# Patient Record
Sex: Female | Born: 1955 | Race: White | Hispanic: No | Smoking: Current every day smoker
Health system: Southern US, Community
[De-identification: ages and names within clinical notes are randomized; demographics above are authoritative.]

## PROBLEM LIST (undated history)

## (undated) DIAGNOSIS — G629 Polyneuropathy, unspecified: Secondary | ICD-10-CM

## (undated) DIAGNOSIS — J302 Other seasonal allergic rhinitis: Secondary | ICD-10-CM

## (undated) DIAGNOSIS — I10 Essential (primary) hypertension: Secondary | ICD-10-CM

## (undated) DIAGNOSIS — C801 Malignant (primary) neoplasm, unspecified: Secondary | ICD-10-CM

## (undated) DIAGNOSIS — E7800 Pure hypercholesterolemia, unspecified: Secondary | ICD-10-CM

## (undated) DIAGNOSIS — E119 Type 2 diabetes mellitus without complications: Secondary | ICD-10-CM

## (undated) HISTORY — PX: TUBAL LIGATION: SHX77

## (undated) HISTORY — PX: LAPAROSCOPY: SHX197

## (undated) HISTORY — PX: FOOT SURGERY: SHX648

## (undated) HISTORY — PX: TUNNELED VENOUS CATHETER PLACEMENT: SHX818

---

## 2001-11-11 ENCOUNTER — Encounter: Admission: RE | Admit: 2001-11-11 | Discharge: 2001-11-11 | Payer: Self-pay | Admitting: Pulmonary Disease

## 2003-06-25 ENCOUNTER — Ambulatory Visit (HOSPITAL_COMMUNITY): Admission: RE | Admit: 2003-06-25 | Discharge: 2003-06-25 | Payer: Self-pay | Admitting: Pulmonary Disease

## 2003-07-05 ENCOUNTER — Ambulatory Visit (HOSPITAL_COMMUNITY): Admission: RE | Admit: 2003-07-05 | Discharge: 2003-07-05 | Payer: Self-pay | Admitting: Pulmonary Disease

## 2006-03-15 ENCOUNTER — Ambulatory Visit (HOSPITAL_COMMUNITY): Admission: RE | Admit: 2006-03-15 | Discharge: 2006-03-15 | Payer: Self-pay | Admitting: Pulmonary Disease

## 2011-02-26 DIAGNOSIS — F329 Major depressive disorder, single episode, unspecified: Secondary | ICD-10-CM | POA: Diagnosis not present

## 2011-02-26 DIAGNOSIS — E785 Hyperlipidemia, unspecified: Secondary | ICD-10-CM | POA: Diagnosis not present

## 2011-02-26 DIAGNOSIS — I10 Essential (primary) hypertension: Secondary | ICD-10-CM | POA: Diagnosis not present

## 2011-03-13 DIAGNOSIS — E785 Hyperlipidemia, unspecified: Secondary | ICD-10-CM | POA: Diagnosis not present

## 2011-03-13 DIAGNOSIS — I10 Essential (primary) hypertension: Secondary | ICD-10-CM | POA: Diagnosis not present

## 2011-10-16 DIAGNOSIS — E109 Type 1 diabetes mellitus without complications: Secondary | ICD-10-CM | POA: Diagnosis not present

## 2011-10-16 DIAGNOSIS — I10 Essential (primary) hypertension: Secondary | ICD-10-CM | POA: Diagnosis not present

## 2011-10-16 DIAGNOSIS — F329 Major depressive disorder, single episode, unspecified: Secondary | ICD-10-CM | POA: Diagnosis not present

## 2011-10-16 DIAGNOSIS — E785 Hyperlipidemia, unspecified: Secondary | ICD-10-CM | POA: Diagnosis not present

## 2011-10-18 ENCOUNTER — Other Ambulatory Visit (HOSPITAL_COMMUNITY): Payer: Self-pay | Admitting: Pulmonary Disease

## 2011-10-18 DIAGNOSIS — Z139 Encounter for screening, unspecified: Secondary | ICD-10-CM

## 2011-10-25 ENCOUNTER — Ambulatory Visit (HOSPITAL_COMMUNITY)
Admission: RE | Admit: 2011-10-25 | Discharge: 2011-10-25 | Disposition: A | Discharge status: Home / Self Care | Payer: Medicare Other | Source: Ambulatory Visit | Attending: Pulmonary Disease | Admitting: Pulmonary Disease

## 2011-10-25 DIAGNOSIS — Z1231 Encounter for screening mammogram for malignant neoplasm of breast: Secondary | ICD-10-CM | POA: Insufficient documentation

## 2011-10-25 DIAGNOSIS — Z139 Encounter for screening, unspecified: Secondary | ICD-10-CM

## 2011-11-19 ENCOUNTER — Telehealth: Payer: Self-pay

## 2011-11-19 NOTE — Telephone Encounter (Signed)
LMOM for pt to call. 

## 2011-11-28 NOTE — Telephone Encounter (Signed)
Pt is to call back when she can find someone that she can work out transportation with.

## 2011-12-03 ENCOUNTER — Other Ambulatory Visit: Payer: Self-pay

## 2011-12-03 ENCOUNTER — Telehealth: Payer: Self-pay | Admitting: *Deleted

## 2011-12-03 DIAGNOSIS — Z139 Encounter for screening, unspecified: Secondary | ICD-10-CM

## 2011-12-03 NOTE — Telephone Encounter (Signed)
Kim [name] called over the weekend. Please call her back. Thanks.

## 2011-12-03 NOTE — Telephone Encounter (Signed)
Returned pt's call. LMOM to call.  

## 2011-12-03 NOTE — Telephone Encounter (Signed)
Returned call and LMOM to call. ( Please see triage phone note).

## 2011-12-12 ENCOUNTER — Encounter (HOSPITAL_COMMUNITY): Payer: Self-pay | Admitting: Pharmacy Technician

## 2011-12-12 NOTE — Telephone Encounter (Signed)
CONTINUE GLUCOPHAGE.  PREPOPIK-DRINK WATER TO KEEP URINE LIGHT YELLOW.  PT SHOULD DROP OFF RX 3 DAYS PRIOR TO PROCEDURE.  

## 2011-12-12 NOTE — Telephone Encounter (Signed)
Gastroenterology Pre-Procedure Form    Request Date: 12/12/2011      Requesting Physician: Dr. Juanetta Gosling     PATIENT INFORMATION:  Kim [name] is a 56 y.o., female (DOB=16-Mar-1955).  PROCEDURE: Procedure(s) requested: colonoscopy Procedure Reason: screening for colon cancer  PATIENT REVIEW QUESTIONS: The patient reports the following:   1. Diabetes Melitis: yes  2. Joint replacements in the past 12 months: no 3. Major health problems in the past 3 months: no 4. Has an artificial valve or MVP:no 5. Has been advised in past to take antibiotics in advance of a procedure like teeth cleaning: no}    MEDICATIONS & ALLERGIES:    Patient reports the following regarding taking any blood thinners:   Plavix? no Aspirin?no Coumadin?  no  Patient confirms/reports the following medications:  Current Outpatient Prescriptions  Medication Sig Dispense Refill  . losartan-hydrochlorothiazide (HYZAAR) 100-25 MG per tablet Take 1 tablet by mouth daily.      . metFORMIN (GLUCOPHAGE) 1000 MG tablet Take 1,000 mg by mouth 2 (two) times daily with a meal.      . omeprazole (PRILOSEC) 20 MG capsule Take 20 mg by mouth daily. Takes only as needed      . pravastatin (PRAVACHOL) 40 MG tablet Take 40 mg by mouth daily.        Patient confirms/reports the following allergies:  No Known Allergies  Patient is appropriate to schedule for requested procedure(s): yes  AUTHORIZATION INFORMATION Primary Insurance:   ID #:   Group #:  Pre-Cert / Auth required:  Pre-Cert / Auth #:   Secondary Insurance:   ID #:   Group #:  Pre-Cert / Auth required:  Pre-Cert / Auth #:   No orders of the defined types were placed in this encounter.    SCHEDULE INFORMATION: Procedure has been scheduled as follows:  Date: 12/28/2011                    Time: 8:30 AM  Location: [facility] Short Stay  This Gastroenterology Pre-Precedure Form is being routed to the following provider(s) for review: Jonette Eva,  MD

## 2011-12-13 MED ORDER — SOD PICOSULFATE-MAG OX-CIT ACD 10-3.5-12 MG-GM-GM PO PACK: 1.0000 | PACK | Freq: Once | ORAL | Status: DC

## 2011-12-13 NOTE — Telephone Encounter (Signed)
Rx sent to [facility] and Instructions mailed to pt.

## 2011-12-24 ENCOUNTER — Telehealth: Payer: Self-pay | Admitting: Gastroenterology

## 2011-12-24 NOTE — Telephone Encounter (Signed)
Explained to pt that Dr. Darrick Penna did not make adjustments on her Glucophage.

## 2011-12-24 NOTE — Telephone Encounter (Signed)
Pt called to speak with nurse about her diabetic medication prior to her colonoscopy. Please call her back at [phone]

## 2011-12-28 ENCOUNTER — Ambulatory Visit (HOSPITAL_COMMUNITY)
Admission: RE | Admit: 2011-12-28 | Discharge: 2011-12-28 | Disposition: A | Discharge status: Home / Self Care | Payer: Medicare Other | Source: Ambulatory Visit | Attending: Gastroenterology | Admitting: Gastroenterology

## 2011-12-28 ENCOUNTER — Encounter (HOSPITAL_COMMUNITY): Payer: Self-pay | Admitting: *Deleted

## 2011-12-28 ENCOUNTER — Encounter (HOSPITAL_COMMUNITY)
Admission: RE | Disposition: A | Discharge status: Home / Self Care | Payer: Self-pay | Source: Ambulatory Visit | Attending: Gastroenterology

## 2011-12-28 DIAGNOSIS — Z1211 Encounter for screening for malignant neoplasm of colon: Secondary | ICD-10-CM | POA: Diagnosis not present

## 2011-12-28 DIAGNOSIS — I10 Essential (primary) hypertension: Secondary | ICD-10-CM | POA: Insufficient documentation

## 2011-12-28 DIAGNOSIS — K648 Other hemorrhoids: Secondary | ICD-10-CM | POA: Insufficient documentation

## 2011-12-28 DIAGNOSIS — E119 Type 2 diabetes mellitus without complications: Secondary | ICD-10-CM | POA: Diagnosis not present

## 2011-12-28 DIAGNOSIS — K5730 Diverticulosis of large intestine without perforation or abscess without bleeding: Secondary | ICD-10-CM | POA: Insufficient documentation

## 2011-12-28 DIAGNOSIS — Z139 Encounter for screening, unspecified: Secondary | ICD-10-CM

## 2011-12-28 DIAGNOSIS — D126 Benign neoplasm of colon, unspecified: Secondary | ICD-10-CM | POA: Insufficient documentation

## 2011-12-28 HISTORY — DX: Other seasonal allergic rhinitis: J30.2

## 2011-12-28 HISTORY — PX: COLONOSCOPY: SHX5424

## 2011-12-28 HISTORY — DX: Pure hypercholesterolemia, unspecified: E78.00

## 2011-12-28 HISTORY — DX: Essential (primary) hypertension: I10

## 2011-12-28 HISTORY — DX: Type 2 diabetes mellitus without complications: E11.9

## 2011-12-28 HISTORY — DX: Polyneuropathy, unspecified: G62.9

## 2011-12-28 HISTORY — DX: Malignant (primary) neoplasm, unspecified: C80.1

## 2011-12-28 LAB — GLUCOSE, CAPILLARY: Glucose-Capillary: 179 mg/dL — ABNORMAL HIGH (ref 70–99)

## 2011-12-28 SURGERY — COLONOSCOPY
Anesthesia: Moderate Sedation

## 2011-12-28 MED ORDER — SODIUM CHLORIDE 0.45 % IV SOLN
INTRAVENOUS | Status: DC
  Administered 2011-12-28: 09:00:00 via INTRAVENOUS

## 2011-12-28 MED ORDER — MEPERIDINE HCL 100 MG/ML IJ SOLN
INTRAMUSCULAR | Status: AC
  Filled 2011-12-28: qty 1

## 2011-12-28 MED ORDER — STERILE WATER FOR IRRIGATION IR SOLN
Status: DC | PRN
  Administered 2011-12-28: 09:00:00

## 2011-12-28 MED ORDER — MIDAZOLAM HCL 5 MG/5ML IJ SOLN
INTRAMUSCULAR | Status: AC
  Filled 2011-12-28: qty 10

## 2011-12-28 MED ORDER — MEPERIDINE HCL 100 MG/ML IJ SOLN
INTRAMUSCULAR | Status: DC | PRN
  Administered 2011-12-28: 50 mg via INTRAVENOUS
  Administered 2011-12-28: 25 mg via INTRAVENOUS

## 2011-12-28 MED ORDER — MIDAZOLAM HCL 5 MG/5ML IJ SOLN
INTRAMUSCULAR | Status: DC | PRN
  Administered 2011-12-28 (×2): 2 mg via INTRAVENOUS

## 2011-12-28 NOTE — H&P (Signed)
  Primary Care Physician:  Fredirick Maudlin, MD Primary Gastroenterologist:  Dr. Darrick Penna  Pre-Procedure History & Physical: HPI:  Kim [name] is a 56 y.o. female here for COLON CANCER SCREENING.   Past Medical History  Diagnosis Date  . Hypertension   . Diabetes mellitus without complication   . Hypercholesteremia   . Seasonal allergies   . Neuropathy   . Cancer     history of leukemia    Past Surgical History  Procedure Date  . Tubal ligation   . Laparoscopy   . Tunneled venous catheter placement     Prior to Admission medications   Medication Sig Start Date End Date Taking? Authorizing Provider  acetaminophen (TYLENOL) 500 MG tablet Take 500 mg by mouth every 6 (six) hours as needed. Pain.   Yes Historical Provider, MD  losartan-hydrochlorothiazide (HYZAAR) 100-25 MG per tablet Take 1 tablet by mouth daily.   Yes Historical Provider, MD  metFORMIN (GLUCOPHAGE) 1000 MG tablet Take 1,000 mg by mouth 2 (two) times daily with a meal.   Yes Historical Provider, MD  omeprazole (PRILOSEC) 20 MG capsule Take 20 mg by mouth daily as needed. Heartburn   Yes Historical Provider, MD  pravastatin (PRAVACHOL) 40 MG tablet Take 40 mg by mouth daily.   Yes Historical Provider, MD    Allergies as of 12/03/2011  . (No Known Allergies)    Family History  Problem Relation Age of Onset  . Colon cancer Neg Hx     History   Social History  . Marital Status: Divorced    Spouse Name: N/A    Number of Children: N/A  . Years of Education: N/A   Occupational History  . Not on file.   Social History Main Topics  . Smoking status: Current Every Day Smoker -- 1.0 packs/day for 40 years  . Smokeless tobacco: Not on file  . Alcohol Use: No  . Drug Use: No  . Sexually Active:    Other Topics Concern  . Not on file   Social History Narrative  . No narrative on file    Review of Systems: See HPI, otherwise negative ROS   Physical Exam: BP 124/84  Pulse 99  Temp 97.9 F  (36.6 C) (Oral)  Resp 20  Ht 5\' 8"  (1.727 m)  Wt 145 lb (65.772 kg)  BMI 22.05 kg/m2  SpO2 96% General:   Alert,  pleasant and cooperative in NAD Head:  Normocephalic and atraumatic. Neck:  Supple; Lungs:  Clear throughout to auscultation.    Heart:  Regular rate and rhythm. Abdomen:  Soft, nontender and nondistended. Normal bowel sounds, without guarding, and without rebound.   Neurologic:  Alert and  oriented x4;  grossly normal neurologically.  Impression/Plan:     SCREENING  Plan:  1. TCS TODAY

## 2011-12-28 NOTE — Op Note (Signed)
[facility] [address] [place], [address]   COLONOSCOPY PROCEDURE REPORT  PATIENT: Kim [name], Kim [name]  MR#: [id number] BIRTHDATE: January 30, 1956 , 56  yrs. old GENDER: Female ENDOSCOPIST: Jonette Eva, MD REFERRED [id number] Juanetta Gosling, M.D. PROCEDURE DATE:  12/28/2011 PROCEDURE:   Colonoscopy with snare polypectomy INDICATIONS:average risk patient for colon cancer. MEDICATIONS: Demerol 75 mg IV and Versed 4 mg IV  DESCRIPTION OF PROCEDURE:    Physical exam was performed.  Informed consent was obtained from the patient after explaining the benefits, risks, and alternatives to procedure.  The patient was connected to monitor and placed in left lateral position. Continuous oxygen was provided by nasal cannula and IV medicine administered through an indwelling cannula.  After administration of sedation and rectal exam, the patients rectum was intubated and the Pentax Colonoscope [id number]  colonoscope was advanced under direct visualization to the cecum.  The scope was removed slowly by carefully examining the color, texture, anatomy, and integrity mucosa on the way out.  The patient was recovered in endoscopy and discharged home in satisfactory condition.       COLON FINDINGS: Two sessile polyps measuring 6-8 mm in size were found in the ascending colon.  A polypectomy was performed using snare cautery.  , Moderate diverticulosis was noted in the ascending colon.  , The colon mucosa was otherwise normal.  , and Small internal hemorrhoids were found.  PREP QUALITY: good. CECAL W/D TIME: 19 minutes  COMPLICATIONS: None  ENDOSCOPIC IMPRESSION: 1.   Two sessile polyps measuring 6-8 mm in size were found in the ascending colon; polypectomy was performed using snare cautery 2.   Moderate diverticulosis was noted in the ascending colon 3.   The colon mucosa was otherwise normal 4.   Small internal hemorrhoids   RECOMMENDATIONS: AWAIT BIOPSY HIGH FIBER DIET TCS  IN 10 YEARS       _______________________________ Rosalie DoctorJonette Eva, MD 12/28/2011 10:32 AM     PATIENT NAME:  Kim, [name] MR#: [id number]

## 2012-01-01 ENCOUNTER — Encounter (HOSPITAL_COMMUNITY): Payer: Self-pay | Admitting: Gastroenterology

## 2012-01-07 ENCOUNTER — Telehealth: Payer: Self-pay

## 2012-01-07 NOTE — Telephone Encounter (Signed)
LMOM to call.

## 2012-01-07 NOTE — Telephone Encounter (Signed)
Pt left VM requesting results of her colonoscopy/biopsy. Dr. Darrick Penna out this week. Note to Tana Coast, PA.

## 2012-01-07 NOTE — Telephone Encounter (Signed)
Pt returned call and was informed.  

## 2012-01-07 NOTE — Telephone Encounter (Signed)
You can let patient know the polyps showed no cancer. Further recommendations to follow from Dr. Darrick Penna.

## 2012-01-08 ENCOUNTER — Telehealth: Payer: Self-pay | Admitting: Gastroenterology

## 2012-01-08 NOTE — Telephone Encounter (Signed)
Path faxed to PCP, recall made 

## 2012-01-08 NOTE — Telephone Encounter (Signed)
Pt aware of results 

## 2012-01-08 NOTE — Telephone Encounter (Signed)
Please call pt. She had adenomas removed from her colon.  High fiber diet. TCS in 10 years.

## 2012-07-10 DIAGNOSIS — I10 Essential (primary) hypertension: Secondary | ICD-10-CM | POA: Diagnosis not present

## 2012-07-10 DIAGNOSIS — E785 Hyperlipidemia, unspecified: Secondary | ICD-10-CM | POA: Diagnosis not present

## 2012-07-10 DIAGNOSIS — E1169 Type 2 diabetes mellitus with other specified complication: Secondary | ICD-10-CM | POA: Diagnosis not present

## 2012-07-10 DIAGNOSIS — J301 Allergic rhinitis due to pollen: Secondary | ICD-10-CM | POA: Diagnosis not present

## 2012-07-10 DIAGNOSIS — F329 Major depressive disorder, single episode, unspecified: Secondary | ICD-10-CM | POA: Diagnosis not present

## 2012-09-15 ENCOUNTER — Ambulatory Visit (HOSPITAL_COMMUNITY)
Admission: RE | Admit: 2012-09-15 | Discharge: 2012-09-15 | Disposition: A | Discharge status: Home / Self Care | Payer: Medicare Other | Source: Ambulatory Visit | Attending: Pulmonary Disease | Admitting: Pulmonary Disease

## 2012-09-15 DIAGNOSIS — IMO0001 Reserved for inherently not codable concepts without codable children: Secondary | ICD-10-CM | POA: Insufficient documentation

## 2012-09-15 DIAGNOSIS — M6281 Muscle weakness (generalized): Secondary | ICD-10-CM | POA: Insufficient documentation

## 2012-09-15 DIAGNOSIS — I10 Essential (primary) hypertension: Secondary | ICD-10-CM | POA: Diagnosis not present

## 2012-09-15 DIAGNOSIS — E119 Type 2 diabetes mellitus without complications: Secondary | ICD-10-CM | POA: Insufficient documentation

## 2012-09-15 DIAGNOSIS — Z856 Personal history of leukemia: Secondary | ICD-10-CM | POA: Insufficient documentation

## 2012-09-15 DIAGNOSIS — G5790 Unspecified mononeuropathy of unspecified lower limb: Secondary | ICD-10-CM | POA: Diagnosis not present

## 2012-09-15 NOTE — Evaluation (Signed)
Occupational Therapy Evaluation  Patient Details  Name: Kim [name] MRN: [id number] Date of Birth: 1956/01/24  Today's Date: 09/15/2012 Time: 1050-1130 OT Time Calculation (min): 40 min       [name] is a 57 year old female who has been referred to occupational therapy for assessment for need of a power wheelchair.  [name] has a past medical history that includes leukemia, lumbar puncture, diabetes, hypertension, and neuropathy.        [name] lives in a one story home with a ramped entrance.  She lives with her daughter and son in law, and their children.   [name] has a power chair that is more than 58 years old and is in irreparable condition, She would like a replacement power wheelchair to improve her safety and independence in her home.  She is not able to ambulate at all in her home.  She has bladder urgency and must be able to get to the bathroom quickly.  She is able to dress and bathe herself.   A FULL PHYSICAL ASSESSMENT REVEALS THE FOLLOWING  Existing Equipment: [name] has a power and standard wheelchair and a tub chair. Transfers: [name] declined transferring this date.   Ambulation:  Per patient, she is non-ambulatory. Balance:  WFL static.   Head and Neck: WNL  Trunk: WFL Pelvis: WNL  Hip: Bilateral AROM is WFL. Knees: Bilateral AROM is WFL. Feet and Ankles: Bilateral AROM is WFL. Upper Extremities: Bilateral upper extremity AROM and strength is WNL. Lower Extremities: [name] has 4/5 strength in lower extremities. Weight Shifting Ability:   independent with weight shifting. Skin Integrity: WFL. Activity Tolerance:  Fair.  GOALS/OBJECTIVE OF SEATING INTERVENTION  Recommendations:  [name] has functional deficits in the areas of mobility and self care, which are a result of the above listed diagnosis.  Specific functional limitations include: The patient is unable to walk.  She is unable to functionally propel a lightweight or standard  manual wheelchair for independent mobility quickly and safely enough in her home. [name] will use the power wheelchair on a daily basis as her primary means of mobility.   I recommend the Pride Q6 Edge Grp3 w/Rehab Seat with height adjustable armrests, joystick for controlling the chair, and a seat cushion.  The recommended wheelchair meets current and future positioning needs, accessibility, durability, and safety requirements for functional use within patient's home environment. It is the least expensive power wheelchair that meets the patients current and future needs.   If you require any further information concerning [name]'s positioning, independence or mobility needs; or any further information why a lesser device will not work, please do not hesitate to contact me at [facility], [address], [place] [address] [phone].   ____________________ __________  Sondra Barges, OTR/L     Date    GO Functional Assessment Tool Used: clinical observation Functional Limitation: Mobility: Walking and moving around Mobility: Walking and Moving Around Current Status [phone]): At least 80 percent but less than 100 percent impaired, limited or restricted Mobility: Walking and Moving Around Goal Status [phone]): At least 80 percent but less than 100 percent impaired, limited or restricted Mobility: Walking and Moving Around Discharge Status [phone]): At least 80 percent but less than 100 percent impaired, limited or restricted

## 2012-10-09 DIAGNOSIS — E109 Type 1 diabetes mellitus without complications: Secondary | ICD-10-CM | POA: Diagnosis not present

## 2012-10-09 DIAGNOSIS — G589 Mononeuropathy, unspecified: Secondary | ICD-10-CM | POA: Diagnosis not present

## 2012-10-09 DIAGNOSIS — J449 Chronic obstructive pulmonary disease, unspecified: Secondary | ICD-10-CM | POA: Diagnosis not present

## 2012-10-09 DIAGNOSIS — I10 Essential (primary) hypertension: Secondary | ICD-10-CM | POA: Diagnosis not present

## 2013-01-06 DIAGNOSIS — Z1212 Encounter for screening for malignant neoplasm of rectum: Secondary | ICD-10-CM | POA: Diagnosis not present

## 2013-01-06 DIAGNOSIS — I10 Essential (primary) hypertension: Secondary | ICD-10-CM | POA: Diagnosis not present

## 2013-01-06 DIAGNOSIS — E785 Hyperlipidemia, unspecified: Secondary | ICD-10-CM | POA: Diagnosis not present

## 2013-01-06 DIAGNOSIS — G589 Mononeuropathy, unspecified: Secondary | ICD-10-CM | POA: Diagnosis not present

## 2013-01-06 DIAGNOSIS — Z23 Encounter for immunization: Secondary | ICD-10-CM | POA: Diagnosis not present

## 2013-01-06 DIAGNOSIS — E109 Type 1 diabetes mellitus without complications: Secondary | ICD-10-CM | POA: Diagnosis not present

## 2013-04-08 DIAGNOSIS — E109 Type 1 diabetes mellitus without complications: Secondary | ICD-10-CM | POA: Diagnosis not present

## 2013-04-08 DIAGNOSIS — G589 Mononeuropathy, unspecified: Secondary | ICD-10-CM | POA: Diagnosis not present

## 2013-04-08 DIAGNOSIS — I10 Essential (primary) hypertension: Secondary | ICD-10-CM | POA: Diagnosis not present

## 2013-04-08 DIAGNOSIS — F3289 Other specified depressive episodes: Secondary | ICD-10-CM | POA: Diagnosis not present

## 2013-04-08 DIAGNOSIS — F329 Major depressive disorder, single episode, unspecified: Secondary | ICD-10-CM | POA: Diagnosis not present

## 2013-04-08 DIAGNOSIS — E785 Hyperlipidemia, unspecified: Secondary | ICD-10-CM | POA: Diagnosis not present

## 2013-05-26 ENCOUNTER — Inpatient Hospital Stay (HOSPITAL_COMMUNITY)
Admission: EM | Admit: 2013-05-26 | Discharge: 2013-05-28 | DRG: 190 | Disposition: A | Discharge status: Home / Self Care | Payer: Medicare Other | Attending: Pulmonary Disease | Admitting: Pulmonary Disease

## 2013-05-26 ENCOUNTER — Emergency Department (HOSPITAL_COMMUNITY): Payer: Medicare Other

## 2013-05-26 ENCOUNTER — Encounter (HOSPITAL_COMMUNITY): Payer: Self-pay | Admitting: Emergency Medicine

## 2013-05-26 DIAGNOSIS — J189 Pneumonia, unspecified organism: Secondary | ICD-10-CM | POA: Diagnosis present

## 2013-05-26 DIAGNOSIS — Z853 Personal history of malignant neoplasm of breast: Secondary | ICD-10-CM

## 2013-05-26 DIAGNOSIS — Z993 Dependence on wheelchair: Secondary | ICD-10-CM | POA: Diagnosis not present

## 2013-05-26 DIAGNOSIS — Z856 Personal history of leukemia: Secondary | ICD-10-CM

## 2013-05-26 DIAGNOSIS — R0602 Shortness of breath: Secondary | ICD-10-CM | POA: Diagnosis not present

## 2013-05-26 DIAGNOSIS — J441 Chronic obstructive pulmonary disease with (acute) exacerbation: Secondary | ICD-10-CM | POA: Diagnosis not present

## 2013-05-26 DIAGNOSIS — E669 Obesity, unspecified: Secondary | ICD-10-CM | POA: Diagnosis present

## 2013-05-26 DIAGNOSIS — E119 Type 2 diabetes mellitus without complications: Secondary | ICD-10-CM | POA: Diagnosis not present

## 2013-05-26 DIAGNOSIS — F17200 Nicotine dependence, unspecified, uncomplicated: Secondary | ICD-10-CM | POA: Diagnosis present

## 2013-05-26 DIAGNOSIS — E7800 Pure hypercholesterolemia, unspecified: Secondary | ICD-10-CM | POA: Diagnosis present

## 2013-05-26 DIAGNOSIS — Z23 Encounter for immunization: Secondary | ICD-10-CM

## 2013-05-26 DIAGNOSIS — G620 Drug-induced polyneuropathy: Secondary | ICD-10-CM | POA: Diagnosis present

## 2013-05-26 DIAGNOSIS — I10 Essential (primary) hypertension: Secondary | ICD-10-CM | POA: Diagnosis present

## 2013-05-26 DIAGNOSIS — T451X5A Adverse effect of antineoplastic and immunosuppressive drugs, initial encounter: Secondary | ICD-10-CM | POA: Diagnosis present

## 2013-05-26 DIAGNOSIS — J449 Chronic obstructive pulmonary disease, unspecified: Secondary | ICD-10-CM | POA: Diagnosis present

## 2013-05-26 HISTORY — DX: Pneumonia, unspecified organism: J18.9

## 2013-05-26 LAB — CBC WITH DIFFERENTIAL/PLATELET
BASOS ABS: 0 10*3/uL (ref 0.0–0.1)
Basophils Relative: 0 % (ref 0–1)
EOS ABS: 0 10*3/uL (ref 0.0–0.7)
Eosinophils Relative: 0 % (ref 0–5)
HCT: 37.6 % (ref 36.0–46.0)
Hemoglobin: 13.1 g/dL (ref 12.0–15.0)
LYMPHS ABS: 1.5 10*3/uL (ref 0.7–4.0)
Lymphocytes Relative: 13 % (ref 12–46)
MCH: 30.1 pg (ref 26.0–34.0)
MCHC: 34.8 g/dL (ref 30.0–36.0)
MCV: 86.4 fL (ref 78.0–100.0)
MONO ABS: 0.9 10*3/uL (ref 0.1–1.0)
Monocytes Relative: 8 % (ref 3–12)
NEUTROS PCT: 79 % — AB (ref 43–77)
Neutro Abs: 9 10*3/uL — ABNORMAL HIGH (ref 1.7–7.7)
PLATELETS: 444 10*3/uL — AB (ref 150–400)
RBC: 4.35 MIL/uL (ref 3.87–5.11)
RDW: 12.6 % (ref 11.5–15.5)
WBC: 11.4 10*3/uL — ABNORMAL HIGH (ref 4.0–10.5)

## 2013-05-26 LAB — BASIC METABOLIC PANEL
BUN: 22 mg/dL (ref 6–23)
CALCIUM: 9.1 mg/dL (ref 8.4–10.5)
CO2: 22 mEq/L (ref 19–32)
CREATININE: 1.16 mg/dL — AB (ref 0.50–1.10)
Chloride: 97 mEq/L (ref 96–112)
GFR, EST AFRICAN AMERICAN: 59 mL/min — AB (ref >90)
GFR, EST NON AFRICAN AMERICAN: 50 mL/min — AB (ref >90)
Glucose, Bld: 277 mg/dL — ABNORMAL HIGH (ref 70–99)
Potassium: 3.3 mEq/L — ABNORMAL LOW (ref 3.7–5.3)
Sodium: 140 mEq/L (ref 137–147)

## 2013-05-26 LAB — GLUCOSE, CAPILLARY: Glucose-Capillary: 291 mg/dL — ABNORMAL HIGH (ref 70–99)

## 2013-05-26 LAB — I-STAT CG4 LACTIC ACID, ED: Lactic Acid, Venous: 2.95 mmol/L — ABNORMAL HIGH (ref 0.5–2.2)

## 2013-05-26 LAB — CBG MONITORING, ED: Glucose-Capillary: 229 mg/dL — ABNORMAL HIGH (ref 70–99)

## 2013-05-26 MED ORDER — SODIUM CHLORIDE 0.9 % IV SOLN
1000.0000 mL | INTRAVENOUS | Status: DC
  Administered 2013-05-26 – 2013-05-28 (×4): 1000 mL via INTRAVENOUS

## 2013-05-26 MED ORDER — IPRATROPIUM BROMIDE 0.02 % IN SOLN
1.0000 mg | Freq: Once | RESPIRATORY_TRACT | Status: AC
  Administered 2013-05-26: 1 mg via RESPIRATORY_TRACT
  Filled 2013-05-26: qty 5

## 2013-05-26 MED ORDER — LOSARTAN POTASSIUM-HCTZ 100-25 MG PO TABS: 1.0000 | ORAL_TABLET | Freq: Every day | ORAL | Status: DC

## 2013-05-26 MED ORDER — ALBUTEROL (5 MG/ML) CONTINUOUS INHALATION SOLN
10.0000 mg/h | INHALATION_SOLUTION | RESPIRATORY_TRACT | Status: DC
  Administered 2013-05-26: 10 mg/h via RESPIRATORY_TRACT
  Filled 2013-05-26: qty 20

## 2013-05-26 MED ORDER — SODIUM CHLORIDE 0.9 % IV SOLN
1000.0000 mL | Freq: Once | INTRAVENOUS | Status: AC
  Administered 2013-05-26: 1000 mL via INTRAVENOUS

## 2013-05-26 MED ORDER — SODIUM CHLORIDE 0.9 % IV SOLN: 1000.0000 mL | INTRAVENOUS | Status: DC

## 2013-05-26 MED ORDER — PANTOPRAZOLE SODIUM 40 MG PO TBEC
40.0000 mg | DELAYED_RELEASE_TABLET | Freq: Every day | ORAL | Status: DC
  Administered 2013-05-26 – 2013-05-28 (×3): 40 mg via ORAL
  Filled 2013-05-26 (×3): qty 1

## 2013-05-26 MED ORDER — METFORMIN HCL 500 MG PO TABS
1000.0000 mg | ORAL_TABLET | Freq: Two times a day (BID) | ORAL | Status: DC
  Administered 2013-05-26: 1000 mg via ORAL
  Filled 2013-05-26 (×2): qty 2

## 2013-05-26 MED ORDER — SODIUM CHLORIDE 0.9 % IJ SOLN: 3.0000 mL | Freq: Two times a day (BID) | INTRAMUSCULAR | Status: DC

## 2013-05-26 MED ORDER — INSULIN ASPART 100 UNIT/ML ~~LOC~~ SOLN
0.0000 [IU] | Freq: Three times a day (TID) | SUBCUTANEOUS | Status: DC
  Administered 2013-05-27: 7 [IU] via SUBCUTANEOUS
  Administered 2013-05-27 – 2013-05-28 (×3): 11 [IU] via SUBCUTANEOUS

## 2013-05-26 MED ORDER — ALBUTEROL SULFATE (2.5 MG/3ML) 0.083% IN NEBU
5.0000 mg | INHALATION_SOLUTION | Freq: Once | RESPIRATORY_TRACT | Status: AC
  Administered 2013-05-26: 5 mg via RESPIRATORY_TRACT
  Filled 2013-05-26: qty 6

## 2013-05-26 MED ORDER — ACETAMINOPHEN 500 MG PO TABS: 500.0000 mg | ORAL_TABLET | Freq: Four times a day (QID) | ORAL | Status: DC | PRN

## 2013-05-26 MED ORDER — HYDROCHLOROTHIAZIDE 25 MG PO TABS
25.0000 mg | ORAL_TABLET | Freq: Every day | ORAL | Status: DC
  Administered 2013-05-27 – 2013-05-28 (×2): 25 mg via ORAL
  Filled 2013-05-26 (×2): qty 1

## 2013-05-26 MED ORDER — LOSARTAN POTASSIUM 50 MG PO TABS
100.0000 mg | ORAL_TABLET | Freq: Every day | ORAL | Status: DC
  Administered 2013-05-27 – 2013-05-28 (×2): 100 mg via ORAL
  Filled 2013-05-26 (×2): qty 2

## 2013-05-26 MED ORDER — ONDANSETRON HCL 4 MG PO TABS: 4.0000 mg | ORAL_TABLET | Freq: Four times a day (QID) | ORAL | Status: DC | PRN

## 2013-05-26 MED ORDER — ONDANSETRON HCL 4 MG/2ML IJ SOLN: 4.0000 mg | Freq: Four times a day (QID) | INTRAMUSCULAR | Status: DC | PRN

## 2013-05-26 MED ORDER — LEVOFLOXACIN IN D5W 750 MG/150ML IV SOLN
750.0000 mg | Freq: Once | INTRAVENOUS | Status: AC
  Administered 2013-05-26: 750 mg via INTRAVENOUS
  Filled 2013-05-26: qty 150

## 2013-05-26 MED ORDER — LEVOFLOXACIN IN D5W 500 MG/100ML IV SOLN
500.0000 mg | INTRAVENOUS | Status: DC
  Administered 2013-05-27: 500 mg via INTRAVENOUS
  Filled 2013-05-26: qty 100

## 2013-05-26 MED ORDER — INSULIN ASPART 100 UNIT/ML ~~LOC~~ SOLN
0.0000 [IU] | Freq: Every day | SUBCUTANEOUS | Status: DC
  Administered 2013-05-26 – 2013-05-27 (×2): 3 [IU] via SUBCUTANEOUS

## 2013-05-26 MED ORDER — METHYLPREDNISOLONE SODIUM SUCC 125 MG IJ SOLR
125.0000 mg | Freq: Four times a day (QID) | INTRAMUSCULAR | Status: DC
  Administered 2013-05-26 – 2013-05-27 (×3): 125 mg via INTRAVENOUS
  Filled 2013-05-26 (×3): qty 2

## 2013-05-26 MED ORDER — PNEUMOCOCCAL VAC POLYVALENT 25 MCG/0.5ML IJ INJ
0.5000 mL | INJECTION | INTRAMUSCULAR | Status: AC
  Administered 2013-05-27: 0.5 mL via INTRAMUSCULAR
  Filled 2013-05-26: qty 0.5

## 2013-05-26 MED ORDER — SIMVASTATIN 20 MG PO TABS
20.0000 mg | ORAL_TABLET | Freq: Every day | ORAL | Status: DC
  Administered 2013-05-26 – 2013-05-27 (×2): 20 mg via ORAL
  Filled 2013-05-26 (×2): qty 1

## 2013-05-26 MED ORDER — HEPARIN SODIUM (PORCINE) 5000 UNIT/ML IJ SOLN
5000.0000 [IU] | Freq: Three times a day (TID) | INTRAMUSCULAR | Status: DC
  Administered 2013-05-26 – 2013-05-28 (×5): 5000 [IU] via SUBCUTANEOUS
  Filled 2013-05-26 (×5): qty 1

## 2013-05-26 NOTE — ED Notes (Signed)
Pt c/o productive cough, sob, r rib pain x 3 weeks.  Has been on zpack but says is not any better.  C/O pain to r rib area with coughing.  Took mucinex approx 2 hours ago.

## 2013-05-26 NOTE — H&P (Signed)
[facility] History and Physical  NIKITHA MODE CVE:[id number] DOB: Mar 17, 1955 DOA: 05/26/2013  Referring physician: ER PCP: Alonza Bogus, MD   Chief Complaint: Dyspnea, productive cough.  HPI: Kim [name] is a 58 y.o. female  This is a 58 year old lady who has history of long-standing tobacco abuse and presents with a three-week history of cough productive of yellow/greenish sputum associated with dyspnea. She was treated by her primary care physician, Dr. Luan Pulling, with a Z-Pak and initially this helped her but she has been getting worse again. She has had subjective fever. She is diabetic and hypertensive.   Review of Systems:  Constitutional:  No weight loss, night sweats,  chills, fatigue.  HEENT:  No headaches, Difficulty swallowing,Tooth/dental problems,Sore throat,  No sneezing, itching, ear ache, nasal congestion, post nasal drip,  Cardio-vascular:  No chest pain, Orthopnea, PND, swelling in lower extremities, anasarca, dizziness, palpitations  GI:  No heartburn, indigestion, abdominal pain, nausea, vomiting, diarrhea, change in bowel habits, loss of appetite   Skin:  no rash or lesions.  GU:  no dysuria, change in color of urine, no urgency or frequency. No flank pain.  Musculoskeletal:  No joint pain or swelling. No decreased range of motion. No back pain.  Psych:  No change in mood or affect. No depression or anxiety. No memory loss.   Past Medical History  Diagnosis Date  . Hypertension   . Diabetes mellitus without complication   . Hypercholesteremia   . Seasonal allergies   . Neuropathy   . Cancer     history of leukemia   Past Surgical History  Procedure Laterality Date  . Tubal ligation    . Laparoscopy    . Tunneled venous catheter placement    . Colonoscopy  12/28/2011    Procedure: COLONOSCOPY;  Surgeon: Danie Binder, MD;  Location: [facility];  Service: Endoscopy;  Laterality: N/A;  9:30 AM-changed to 8:30 [name] to notify pt    Social History:  reports that she has been smoking.  She does not have any smokeless tobacco history on file. She reports that she does not drink alcohol or use illicit drugs.  No Known Allergies  Family History  Problem Relation Age of Onset  . Colon cancer Neg Hx      Prior to Admission medications   Medication Sig Start Date End Date Taking? Authorizing Provider  losartan-hydrochlorothiazide (HYZAAR) 100-25 MG per tablet Take 1 tablet by mouth daily.   Yes Historical Provider, MD  metFORMIN (GLUCOPHAGE) 1000 MG tablet Take 1,000 mg by mouth 2 (two) times daily with a meal.   Yes Historical Provider, MD  omeprazole (PRILOSEC) 20 MG capsule Take 20 mg by mouth daily as needed. Heartburn   Yes Historical Provider, MD  pravastatin (PRAVACHOL) 40 MG tablet Take 40 mg by mouth daily.   Yes Historical Provider, MD  acetaminophen (TYLENOL) 500 MG tablet Take 500 mg by mouth every 6 (six) hours as needed. Pain.    Historical Provider, MD   Physical Exam: Filed Vitals:   05/26/13 1721  BP: 113/71  Pulse: 123  Temp: 98.2 F (36.8 C)  Resp:     BP 113/71  Pulse 123  Temp(Src) 98.2 F (36.8 C) (Oral)  Resp 19  Ht 5\' 7"  (1.702 m)  Wt 86.546 kg (190 lb 12.8 oz)  BMI 29.88 kg/m2  SpO2 96%  General:  Appears calm and comfortable Eyes: PERRL, normal lids, irises & conjunctiva ENT: grossly normal hearing, lips & tongue Neck:  no LAD, masses or thyromegaly Cardiovascular: RRR, no m/r/g. No LE edema. Telemetry: SR, no arrhythmias  Respiratory: CTA bilaterally, no w/r/r. Normal respiratory effort. Only a few scattered wheezes. No bronchial breathing or crackles. Abdomen: soft, ntnd Skin: no rash or induration seen on limited exam Musculoskeletal: grossly normal tone BUE/BLE Psychiatric: grossly normal mood and affect, speech fluent and appropriate Neurologic: grossly non-focal.          Labs on Admission:  Basic Metabolic Panel:  Recent Labs Lab 05/26/13 1313  NA 140  K  3.3*  CL 97  CO2 22  GLUCOSE 277*  BUN 22  CREATININE 1.16*  CALCIUM 9.1       CBC:  Recent Labs Lab 05/26/13 1313  WBC 11.4*  NEUTROABS 9.0*  HGB 13.1  HCT 37.6  MCV 86.4  PLT 444*    CBG:  Recent Labs Lab 05/26/13 1707  GLUCAP 229*    Radiological Exams on Admission: Dg Chest 2 View  05/26/2013   CLINICAL DATA:  SHORTNESS OF BREATH SHORTNESS OF BREATH  EXAM: CHEST  2 VIEW  COMPARISON:  None.  FINDINGS: The heart size and mediastinal contours are within normal limits. Diffuse thickening of interstitial markings appreciated with a fine nodular appearance. There is no significant peribronchial cuffing. No focal regions of consolidation or focal infiltrates. The osseous structures unremarkable.  IMPRESSION: Interstitial infiltrate infectious versus inflammatory. Surveillance evaluation to assure resolution status post therapy is recommended.   Electronically Signed   By: Margaree Mackintosh M.D.   On: 05/26/2013 13:39      Assessment/Plan   1. COPD exacerbation. 2. Possible community-acquired pneumonia. 3. Hypertension. 4. Type 2 diabetes mellitus. 5. Obesity.  Plan: 1. Admit to medical floor. 2. Intravenous steroids. 3. Intravenous antibiotics. 4. Sliding scale of insulin.  Further recommendations will depend on patient's hospital progress.   Code Status: Full code.   Family Communication: I discussed the plan with patient at the bedside.   Disposition Plan: Home when medically stable.  Time spent: 45 minutes.  [facility] Pager [phone].

## 2013-05-26 NOTE — ED Notes (Signed)
RT aware or breathing tx.

## 2013-05-26 NOTE — ED Provider Notes (Signed)
CSN: [id number]     Arrival date & time 05/26/13  1241 History  This chart was scribed for Orlie Dakin, MD by Roxan Diesel, ED scribe.  This patient was seen in room APA19/APA19 and the patient's care was started at 1:02 PM.   Chief Complaint  Patient presents with  . Shortness of Breath    Patient is a 58 y.o. female presenting with shortness of breath. The history is provided by the patient. No language interpreter was used.  Shortness of Breath Severity:  Severe Duration:  3 weeks Chronicity:  New Ineffective treatments: Z-Pak. Associated symptoms: chest pain (right rib pain on coughing), cough and vomiting (resolved 1 week ago)  Fever: subjective, not measured.   Risk factors: hx of cancer and tobacco use   Risk factors: no recent alcohol use     HPI Comments: Kim [name] is a 58 y.o. female with h/o leukemia, DM, HTN, hypercholesteremia, and seasonal allergies who presents to the Emergency Department complaining of 3 weeks of persistent productive cough with associated SOB and subjective fever.  Pt states cough is productive of green and yellow sputum.  She states her breath is "diminished" by coughing.  She reports she felt hot but she did not take her temperature at home.  She also reports right rib pain on coughing.  Pt states she thinks she has "the flu" although she was seen by her PCP Dr. Luan Pulling last week and placed on a Z-Pak.  She states the Z-Pak relieved her generalized body aches somewhat but her other symptoms have persisted.  She also took some Mucinex 1 hour ago.  She has not taken any other medications pta.  Pt also reports she was vomiting previously but her last episode of emesis was one week ago.  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 She is a smoker but has not smoked in the past 3 weeks.  She does not drink alcohol.  She does not use illegal drugs.  She denies h/o emphysema or COPD to her knowledge.   She denies medication  allergies.       Past Medical History  Diagnosis Date  . Hypertension   . Diabetes mellitus without complication   . Hypercholesteremia   . Seasonal allergies   . Neuropathy   . Cancer     history of leukemia    Past Surgical History  Procedure Laterality Date  . Tubal ligation    . Laparoscopy    . Tunneled venous catheter placement    . Colonoscopy  12/28/2011    Procedure: COLONOSCOPY;  Surgeon: Danie Binder, MD;  Location: [facility];  Service: Endoscopy;  Laterality: N/A;  9:30 AM-changed to 8:30 [name] to notify pt    Family History  Problem Relation Age of Onset  . Colon cancer Neg Hx     History  Substance Use Topics  . Smoking status: Current Every Day Smoker -- 1.00 packs/day for 40 years  . Smokeless tobacco: Not on file  . Alcohol Use: No    OB History   Grav Para Term Preterm Abortions TAB SAB Ect Mult Living                  Review of Systems  Constitutional: Fever: subjective, not measured.  HENT: Negative.   Respiratory: Positive for cough and shortness of breath.   Cardiovascular: Positive for chest pain (right rib pain on coughing).  Gastrointestinal: Positive for vomiting (resolved 1 week ago).  Musculoskeletal: Positive for myalgias (relieved by Z-Pak).  Skin: Negative.   Neurological: Negative.   Psychiatric/Behavioral: Negative.   All other systems reviewed and are negative.     Allergies  Review of patient's allergies indicates no known allergies.  Home Medications   Prior to Admission medications   Medication Sig Start Date End Date Taking? Authorizing Provider  acetaminophen (TYLENOL) 500 MG tablet Take 500 mg by mouth every 6 (six) hours as needed. Pain.    Historical Provider, MD  losartan-hydrochlorothiazide (HYZAAR) 100-25 MG per tablet Take 1 tablet by mouth daily.    Historical Provider, MD  metFORMIN (GLUCOPHAGE) 1000 MG tablet Take 1,000 mg by mouth 2 (two) times daily with a meal.    Historical Provider, MD   omeprazole (PRILOSEC) 20 MG capsule Take 20 mg by mouth daily as needed. Heartburn    Historical Provider, MD  pravastatin (PRAVACHOL) 40 MG tablet Take 40 mg by mouth daily.    Historical Provider, MD   BP 109/73  Pulse 103  Temp(Src) 97.9 F (36.6 C) (Oral)  Resp 28  Ht 5\' 7"  (1.702 m)  Wt 145 lb (65.772 kg)  BMI 22.71 kg/m2  SpO2 96%  Physical Exam  Nursing note and vitals reviewed. Constitutional: She is oriented to person, place, and time. She appears well-developed.  Chronically and acutely ill-appearing  HENT:  Head: Normocephalic and atraumatic.  Mouth/Throat: Mucous membranes are dry.  Eyes: Conjunctivae and EOM are normal. Pupils are equal, round, and reactive to light.  Neck: Normal range of motion. Neck supple. No tracheal deviation present. No thyromegaly present.  Cardiovascular: Normal rate, regular rhythm and normal heart sounds.   No murmur heard. Pulmonary/Chest: Effort normal. She has rhonchi. She has rales.  Scant rales diffusely Rhonchi on left side Coughing. tachypneic  Abdominal: Soft. Bowel sounds are normal. She exhibits no distension. There is no tenderness.  Musculoskeletal: Normal range of motion. She exhibits no edema and no tenderness.  Neurological: She is alert and oriented to person, place, and time. Coordination normal.  Skin: Skin is warm and dry. No rash noted.  Psychiatric: She has a normal mood and affect. Her behavior is normal.    ED Course  Procedures (including critical care time)  DIAGNOSTIC STUDIES: Oxygen Saturation is 96% on room air, normal by my interpretation.    COORDINATION OF CARE: 1:12 PM-Discussed treatment plan which includes CXR and labs with pt at bedside and pt agreed to plan.     Labs Review Labs Reviewed  CBC WITH DIFFERENTIAL  BASIC METABOLIC PANEL    Imaging Review No results found.    EKG Interpretation None     2:05 PM feels improved after treatment with albuterol nebulizer. Breathing is  improved, not at baseline. She appears to be breathing more comfortably.  Chest x-ray viewed by me. Results for orders placed during the hospital encounter of 05/26/13  CBC WITH DIFFERENTIAL      Result Value Ref Range   WBC 11.4 (*) 4.0 - 10.5 K/uL   RBC 4.35  3.87 - 5.11 MIL/uL   Hemoglobin 13.1  12.0 - 15.0 g/dL   HCT 37.6  36.0 - 46.0 %   MCV 86.4  78.0 - 100.0 fL   MCH 30.1  26.0 - 34.0 pg   MCHC 34.8  30.0 - 36.0 g/dL   RDW 12.6  11.5 - 15.5 %   Platelets 444 (*) 150 - 400 K/uL   Neutrophils Relative % 79 (*) 43 - 77 %  Lymphocytes Relative 13  12 - 46 %   Monocytes Relative 8  3 - 12 %   Eosinophils Relative 0  0 - 5 %   Basophils Relative 0  0 - 1 %   Neutro Abs 9.0 (*) 1.7 - 7.7 K/uL   Lymphs Abs 1.5  0.7 - 4.0 K/uL   Monocytes Absolute 0.9  0.1 - 1.0 K/uL   Eosinophils Absolute 0.0  0.0 - 0.7 K/uL   Basophils Absolute 0.0  0.0 - 0.1 K/uL   WBC Morphology ATYPICAL LYMPHOCYTES     Smear Review PLATELET COUNT CONFIRMED BY SMEAR    BASIC METABOLIC PANEL      Result Value Ref Range   Sodium 140  137 - 147 mEq/L   Potassium 3.3 (*) 3.7 - 5.3 mEq/L   Chloride 97  96 - 112 mEq/L   CO2 22  19 - 32 mEq/L   Glucose, Bld 277 (*) 70 - 99 mg/dL   BUN 22  6 - 23 mg/dL   Creatinine, Ser 1.16 (*) 0.50 - 1.10 mg/dL   Calcium 9.1  8.4 - 10.5 mg/dL   GFR calc non Af Amer 50 (*) >90 mL/min   GFR calc Af Amer 59 (*) >90 mL/min  I-STAT CG4 LACTIC ACID, ED      Result Value Ref Range   Lactic Acid, Venous 2.95 (*) 0.5 - 2.2 mmol/L   Dg Chest 2 View  05/26/2013   CLINICAL DATA:  SHORTNESS OF BREATH SHORTNESS OF BREATH  EXAM: CHEST  2 VIEW  COMPARISON:  None.  FINDINGS: The heart size and mediastinal contours are within normal limits. Diffuse thickening of interstitial markings appreciated with a fine nodular appearance. There is no significant peribronchial cuffing. No focal regions of consolidation or focal infiltrates. The osseous structures unremarkable.  IMPRESSION: Interstitial  infiltrate infectious versus inflammatory. Surveillance evaluation to assure resolution status post therapy is recommended.   Electronically Signed   By: Margaree Mackintosh M.D.   On: 05/26/2013 13:39    MDM  Labs chest x-ray bilateral lung sounds will treat for community acquired pneumonia. Levaquin ordered. Atypical pneumonia sulfa also possibility. Final diagnoses:  None   Spoke with [facility] admit medical surgical floor Diagnosis community acquired pneumonia     I personally performed the services described in this documentation, which was scribed in my presence. The recorded information has been reviewed and considered.   Orlie Dakin, MD 05/26/13 1640

## 2013-05-27 DIAGNOSIS — J189 Pneumonia, unspecified organism: Secondary | ICD-10-CM | POA: Diagnosis not present

## 2013-05-27 LAB — GLUCOSE, CAPILLARY
GLUCOSE-CAPILLARY: 292 mg/dL — AB (ref 70–99)
Glucose-Capillary: 246 mg/dL — ABNORMAL HIGH (ref 70–99)
Glucose-Capillary: 300 mg/dL — ABNORMAL HIGH (ref 70–99)
Glucose-Capillary: 289 mg/dL — ABNORMAL HIGH (ref 70–99)

## 2013-05-27 LAB — COMPREHENSIVE METABOLIC PANEL
ALBUMIN: 2.6 g/dL — AB (ref 3.5–5.2)
ALT: 13 U/L (ref 0–35)
AST: 14 U/L (ref 0–37)
Alkaline Phosphatase: 98 U/L (ref 39–117)
BILIRUBIN TOTAL: 0.3 mg/dL (ref 0.3–1.2)
BUN: 17 mg/dL (ref 6–23)
CHLORIDE: 100 meq/L (ref 96–112)
CO2: 20 meq/L (ref 19–32)
CREATININE: 0.98 mg/dL (ref 0.50–1.10)
Calcium: 8.3 mg/dL — ABNORMAL LOW (ref 8.4–10.5)
GFR calc Af Amer: 72 mL/min — ABNORMAL LOW (ref >90)
GFR calc non Af Amer: 62 mL/min — ABNORMAL LOW (ref >90)
Glucose, Bld: 290 mg/dL — ABNORMAL HIGH (ref 70–99)
POTASSIUM: 3.5 meq/L — AB (ref 3.7–5.3)
SODIUM: 142 meq/L (ref 137–147)
Total Protein: 7.8 g/dL (ref 6.0–8.3)

## 2013-05-27 LAB — CBC
HCT: 36 % (ref 36.0–46.0)
Hemoglobin: 12.3 g/dL (ref 12.0–15.0)
MCH: 29.9 pg (ref 26.0–34.0)
MCHC: 34.2 g/dL (ref 30.0–36.0)
MCV: 87.4 fL (ref 78.0–100.0)
PLATELETS: 481 10*3/uL — AB (ref 150–400)
RBC: 4.12 MIL/uL (ref 3.87–5.11)
RDW: 12.7 % (ref 11.5–15.5)
WBC: 9.4 10*3/uL (ref 4.0–10.5)

## 2013-05-27 LAB — HEMOGLOBIN A1C
HEMOGLOBIN A1C: 7.8 % — AB (ref <5.7)
MEAN PLASMA GLUCOSE: 177 mg/dL — AB (ref <117)

## 2013-05-27 MED ORDER — METHYLPREDNISOLONE SODIUM SUCC 40 MG IJ SOLR
40.0000 mg | Freq: Four times a day (QID) | INTRAMUSCULAR | Status: DC
  Administered 2013-05-27 – 2013-05-28 (×4): 40 mg via INTRAVENOUS
  Filled 2013-05-27 (×4): qty 1

## 2013-05-27 MED ORDER — INSULIN DETEMIR 100 UNIT/ML ~~LOC~~ SOLN
10.0000 [IU] | Freq: Every day | SUBCUTANEOUS | Status: DC
  Administered 2013-05-27: 10 [IU] via SUBCUTANEOUS
  Filled 2013-05-27 (×2): qty 0.1

## 2013-05-27 NOTE — Progress Notes (Signed)
Subjective: She was admitted with community-acquired pneumonia/COPD exacerbation. She says she feels much better. She is not as short of breath. She's not coughing as much.  Objective: Vital signs in last 24 hours: Temp:  [97.9 F (36.6 C)-99.4 F (37.4 C)] 97.9 F (36.6 C) (04/15 0500) Pulse Rate:  [100-126] 100 (04/15 0500) Resp:  [16-29] 16 (04/15 0500) BP: (100-147)/(49-86) 147/86 mmHg (04/15 0500) SpO2:  [92 %-97 %] 93 % (04/15 0500) Weight:  [65.772 kg (145 lb)-86.546 kg (190 lb 12.8 oz)] 86.546 kg (190 lb 12.8 oz) (04/14 1721) Weight change:  Last BM Date: 05/26/13  Intake/Output from previous day: 04/14 0701 - 04/15 0700 In: 1190 [I.V.:1190] Out: -   PHYSICAL EXAM General appearance: alert, cooperative and mild distress Resp: rhonchi bilaterally Cardio: regular rate and rhythm, S1, S2 normal, no murmur, click, rub or gallop GI: soft, non-tender; bowel sounds normal; no masses,  no organomegaly Extremities: extremities normal, atraumatic, no cyanosis or edema, Homans sign is negative, no sign of DVT, no edema, redness or tenderness in the calves or thighs and 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  Lab Results:    Basic Metabolic Panel:  Recent Labs  05/26/13 1313 05/27/13 0524  NA 140 142  K 3.3* 3.5*  CL 97 100  CO2 22 20  GLUCOSE 277* 290*  BUN 22 17  CREATININE 1.16* 0.98  CALCIUM 9.1 8.3*   Liver Function Tests:  Recent Labs  05/27/13 0524  AST 14  ALT 13  ALKPHOS 98  BILITOT 0.3  PROT 7.8  ALBUMIN 2.6*   No results found for this basename: LIPASE, AMYLASE,  in the last 72 hours No results found for this basename: AMMONIA,  in the last 72 hours CBC:  Recent Labs  05/26/13 1313 05/27/13 0524  WBC 11.4* 9.4  NEUTROABS 9.0*  --   HGB 13.1 12.3  HCT 37.6 36.0  MCV 86.4 87.4  PLT 444* 481*   Cardiac Enzymes: No results found for this basename: CKTOTAL, CKMB, CKMBINDEX,  TROPONINI,  in the last 72 hours BNP: No results found for this basename: PROBNP,  in the last 72 hours D-Dimer: No results found for this basename: DDIMER,  in the last 72 hours CBG:  Recent Labs  05/26/13 1707 05/26/13 2056 05/27/13 0811  GLUCAP 229* 291* 246*   Hemoglobin A1C: No results found for this basename: HGBA1C,  in the last 72 hours Fasting Lipid Panel: No results found for this basename: CHOL, HDL, LDLCALC, TRIG, CHOLHDL, LDLDIRECT,  in the last 72 hours Thyroid Function Tests: No results found for this basename: TSH, T4TOTAL, FREET4, T3FREE, THYROIDAB,  in the last 72 hours Anemia Panel: No results found for this basename: VITAMINB12, FOLATE, FERRITIN, TIBC, IRON, RETICCTPCT,  in the last 72 hours Coagulation: No results found for this basename: LABPROT, INR,  in the last 72 hours Urine Drug Screen: Drugs of Abuse  No results found for this basename: labopia, cocainscrnur, labbenz, amphetmu, thcu, labbarb    Alcohol Level: No results found for this basename: ETH,  in the last 72 hours Urinalysis: No results found for this basename: COLORURINE, APPERANCEUR, LABSPEC, PHURINE, GLUCOSEU, HGBUR, BILIRUBINUR, KETONESUR, PROTEINUR, UROBILINOGEN, NITRITE, LEUKOCYTESUR,  in the last 72 hours Misc. Labs:  ABGS No results found for this basename: PHART, PCO2, PO2ART, TCO2, HCO3,  in the last 72 hours CULTURES No results found for this or any previous visit (from the past 240 hour(s)). Studies/Results: Dg Chest 2  View  05/26/2013   CLINICAL DATA:  SHORTNESS OF BREATH SHORTNESS OF BREATH  EXAM: CHEST  2 VIEW  COMPARISON:  None.  FINDINGS: The heart size and mediastinal contours are within normal limits. Diffuse thickening of interstitial markings appreciated with a fine nodular appearance. There is no significant peribronchial cuffing. No focal regions of consolidation or focal infiltrates. The osseous structures unremarkable.  IMPRESSION: Interstitial infiltrate infectious  versus inflammatory. Surveillance evaluation to assure resolution status post therapy is recommended.   Electronically Signed   By: Margaree Mackintosh M.D.   On: 05/26/2013 13:39    Medications:  Prior to Admission:  Prescriptions prior to admission  Medication Sig Dispense Refill  . losartan-hydrochlorothiazide (HYZAAR) 100-25 MG per tablet Take 1 tablet by mouth daily.      . metFORMIN (GLUCOPHAGE) 1000 MG tablet Take 1,000 mg by mouth 2 (two) times daily with a meal.      . omeprazole (PRILOSEC) 20 MG capsule Take 20 mg by mouth daily as needed. Heartburn      . pravastatin (PRAVACHOL) 40 MG tablet Take 40 mg by mouth daily.      [name] acetaminophen (TYLENOL) 500 MG tablet Take 500 mg by mouth every 6 (six) hours as needed. Pain.       Scheduled: . heparin  5,000 Units Subcutaneous 3 times per day  . losartan  100 mg Oral Daily   And  . hydrochlorothiazide  25 mg Oral Daily  . insulin aspart  0-20 Units Subcutaneous TID WC  . insulin aspart  0-5 Units Subcutaneous QHS  . insulin detemir  10 Units Subcutaneous Daily  . levofloxacin (LEVAQUIN) IV  500 mg Intravenous Q24H  . methylPREDNISolone (SOLU-MEDROL) injection  40 mg Intravenous Q6H  . pantoprazole  40 mg Oral Daily  . pneumococcal 23 valent vaccine  0.5 mL Intramuscular Tomorrow-1000  . simvastatin  20 mg Oral q1800   Continuous: . sodium chloride 1,000 mL (05/27/13 0605)   LJQ:[id number], ondansetron (ZOFRAN) IV, ondansetron  Assesment: She has community-acquired pneumonia and clearly is much improved. She has COPD exacerbation which is also improved. She has hypertension which is pre-well controlled. She has diabetes and her blood sugar has been up. Active Problems:   Community acquired pneumonia   COPD exacerbation   HTN (hypertension)   Diabetes    Plan: I'm going to decrease her Solu-Medrol. She will start on basal insulin. Continue other treatments. Probably discharge in the morning.    LOS: 1 day   Alonza Bogus 05/27/2013, 8:52 AM

## 2013-05-27 NOTE — Progress Notes (Signed)
Inpatient Diabetes Program Recommendations  AACE/ADA: New Consensus Statement on Inpatient Glycemic Control (2013)  Target Ranges:  Prepandial:   less than 140 mg/dL      Peak postprandial:   less than 180 mg/dL (1-2 hours)      Critically ill patients:  140 - 180 mg/dL   Results for Kim [name], Kim [name] (MRN [id number]) as of 05/27/2013 07:17  Ref. Range 05/26/2013 13:13 05/27/2013 05:24  Glucose Latest Range: 70-99 mg/dL 277 (H) 290 (H)   Diabetes history: DM2 Outpatient Diabetes medications: Metformin 1000 mg BID Current orders for Inpatient glycemic control: Novolog 0-20 units AC, Novolog 0-5 units HS, Metformin 1000 mg BID  Inpatient Diabetes Program Recommendations Insulin - Basal: Please consider ordering low dose basal; recommend starting with Levemir 10 units daily.  Thanks, Barnie Alderman, RN, MSN, CCRN Diabetes Coordinator Inpatient Diabetes Program [phone] (Team Pager) [phone] ([facility] office) [phone] [facility] office)

## 2013-05-27 NOTE — Care Management Note (Signed)
    Page 1 of 1   05/27/2013     1:42:49 PM   CARE MANAGEMENT NOTE 05/27/2013  Patient:  Kim [name], Kim [name]   Account Number:  [id number]  Date Initiated:  05/27/2013  Documentation initiated by:  Theophilus Kinds  Subjective/Objective Assessment:   Pt admitted from home with COPD and pneumonia. Pt lives with her daughter and will return home at discharge. Pt is fairly independent with ADL's. Pt is weak from cancer in the 1971     Action/Plan:   and uses a wheelchair for assistance with ADL's. Pt is still driving. Pt denies any HH needs at this time.   Anticipated DC Date:  05/29/2013   Anticipated DC Plan:  [place]  CM consult      Choice offered to / List presented to:             Status of service:  Completed, signed off Medicare Important Message given?   (If response is "NO", the following Medicare IM given date fields will be blank) Date Medicare IM given:   Date Additional Medicare IM given:    Discharge Disposition:  HOME/SELF CARE  Per UR Regulation:    If discussed at Long Length of Stay Meetings, dates discussed:    Comments:  05/27/13 [place], RN BSN CM

## 2013-05-27 NOTE — Progress Notes (Signed)
UR chart review completed.  

## 2013-05-28 DIAGNOSIS — J189 Pneumonia, unspecified organism: Secondary | ICD-10-CM | POA: Diagnosis not present

## 2013-05-28 LAB — GLUCOSE, CAPILLARY: GLUCOSE-CAPILLARY: 251 mg/dL — AB (ref 70–99)

## 2013-05-28 MED ORDER — LEVOFLOXACIN 500 MG PO TABS: 500.0000 mg | ORAL_TABLET | Freq: Every day | ORAL | Status: DC

## 2013-05-28 MED ORDER — METHYLPREDNISOLONE (PAK) 4 MG PO TABS: ORAL_TABLET | ORAL | Status: DC

## 2013-05-28 NOTE — Progress Notes (Signed)
She says she feels better and is ready to go home. She has no new complaints. She does not feel like she needs home health. Her exam shows a she's awake and alert her chest is much clearer and she looks better. She will be able to be discharged tomorrow please see discharge summary for details

## 2013-05-28 NOTE — Progress Notes (Signed)
Patient states understanding of discharge instructions.  

## 2013-05-30 NOTE — Discharge Summary (Signed)
Physician Discharge Summary  Patient ID: WM FRUCHTER MRN: [id number] DOB/AGE: [birth year] 59 y.o. 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 Pain.     levofloxacin 500 MG tablet  Commonly known as:  LEVAQUIN  Take 1 tablet (500 mg total) by mouth daily.     losartan-hydrochlorothiazide 100-25 MG per tablet  Commonly known as:  HYZAAR  Take 1 tablet by mouth daily.     metFORMIN 1000 MG tablet  Commonly known as:  GLUCOPHAGE  Take 1,000 mg by mouth 2 (two) times daily with a meal.     methylPREDNIsolone 4 MG tablet  Commonly known as:  MEDROL DOSPACK  follow package directions     omeprazole 20 MG capsule  Commonly known as:  PRILOSEC  Take 20 mg by mouth daily as needed. Heartburn     pravastatin 40 MG tablet  Commonly known as:  PRAVACHOL  Take 40 mg by mouth daily.        Discharged Condition: Improved    Consults: None  Significant Diagnostic Studies: Dg Chest 2 View  05/26/2013   CLINICAL DATA:  SHORTNESS OF BREATH SHORTNESS OF BREATH  EXAM: CHEST  2 VIEW  COMPARISON:  None.  FINDINGS: The heart size and mediastinal contours are within normal limits. Diffuse thickening of interstitial markings appreciated with a fine nodular appearance. There is no significant peribronchial cuffing. No focal regions of consolidation or focal infiltrates. The osseous structures unremarkable.  IMPRESSION: Interstitial infiltrate infectious versus inflammatory. Surveillance evaluation to assure resolution status post therapy is recommended.   Electronically Signed   By: Margaree Mackintosh M.D.   On: 05/26/2013  13:39    Lab Results: Basic Metabolic Panel: No results found for this basename: NA, K, CL, CO2, GLUCOSE, BUN, CREATININE, CALCIUM, MG, PHOS,  in the last 72 hours Liver Function Tests: No results found for this basename: AST, ALT, ALKPHOS, BILITOT, PROT, ALBUMIN,  in the last 72 hours   CBC: No results found for this basename: WBC, NEUTROABS, HGB, HCT, MCV, PLT,  in the last 72 hours  No results found for this or any previous visit (from the past 240 hour(s)).   Hospital Course: She was admitted with cough congestion and shortness of breath. Chest x-ray was suggestive of pneumonia. She also has COPD. She was treated with intravenous antibiotics IV steroids inhaled bronchodilators and improved fairly rapidly. By the time of discharge she was back to baseline. She is mildly short of breath at rest.  Discharge Exam: Blood pressure 127/73, pulse 81, temperature 97.6 F (36.4 C), temperature source Oral, resp. rate 20, height 5\' 7"  (1.702 m), weight 86.546 kg (190 lb 12.8 oz), SpO2 97.00%. She is awake and alert. Her chest is clear. Her heart is regular. She is weak in her extremities which is a chronic finding related to neuropathy from chemotherapy  Disposition: Home she said she did not need home health services      Signed: Alonza Bogus   05/30/2013, 9:38 AM

## 2013-07-16 DIAGNOSIS — I10 Essential (primary) hypertension: Secondary | ICD-10-CM | POA: Diagnosis not present

## 2013-07-16 DIAGNOSIS — F3289 Other specified depressive episodes: Secondary | ICD-10-CM | POA: Diagnosis not present

## 2013-07-16 DIAGNOSIS — G589 Mononeuropathy, unspecified: Secondary | ICD-10-CM | POA: Diagnosis not present

## 2013-07-16 DIAGNOSIS — E109 Type 1 diabetes mellitus without complications: Secondary | ICD-10-CM | POA: Diagnosis not present

## 2013-07-16 DIAGNOSIS — F329 Major depressive disorder, single episode, unspecified: Secondary | ICD-10-CM | POA: Diagnosis not present

## 2013-12-03 DIAGNOSIS — E782 Mixed hyperlipidemia: Secondary | ICD-10-CM | POA: Diagnosis not present

## 2013-12-03 DIAGNOSIS — E1140 Type 2 diabetes mellitus with diabetic neuropathy, unspecified: Secondary | ICD-10-CM | POA: Diagnosis not present

## 2013-12-03 DIAGNOSIS — E1010 Type 1 diabetes mellitus with ketoacidosis without coma: Secondary | ICD-10-CM | POA: Diagnosis not present

## 2013-12-03 DIAGNOSIS — G830 Diplegia of upper limbs: Secondary | ICD-10-CM | POA: Diagnosis not present

## 2013-12-03 DIAGNOSIS — Z23 Encounter for immunization: Secondary | ICD-10-CM | POA: Diagnosis not present

## 2014-06-23 DIAGNOSIS — E782 Mixed hyperlipidemia: Secondary | ICD-10-CM | POA: Diagnosis not present

## 2014-06-23 DIAGNOSIS — G63 Polyneuropathy in diseases classified elsewhere: Secondary | ICD-10-CM | POA: Diagnosis not present

## 2014-06-23 DIAGNOSIS — I10 Essential (primary) hypertension: Secondary | ICD-10-CM | POA: Diagnosis not present

## 2014-06-23 DIAGNOSIS — E1165 Type 2 diabetes mellitus with hyperglycemia: Secondary | ICD-10-CM | POA: Diagnosis not present

## 2014-11-23 DIAGNOSIS — Z23 Encounter for immunization: Secondary | ICD-10-CM | POA: Diagnosis not present

## 2014-11-23 DIAGNOSIS — E1121 Type 2 diabetes mellitus with diabetic nephropathy: Secondary | ICD-10-CM | POA: Diagnosis not present

## 2014-11-23 DIAGNOSIS — E1140 Type 2 diabetes mellitus with diabetic neuropathy, unspecified: Secondary | ICD-10-CM | POA: Diagnosis not present

## 2014-11-23 DIAGNOSIS — I10 Essential (primary) hypertension: Secondary | ICD-10-CM | POA: Diagnosis not present

## 2014-11-23 DIAGNOSIS — E1165 Type 2 diabetes mellitus with hyperglycemia: Secondary | ICD-10-CM | POA: Diagnosis not present

## 2015-05-04 DIAGNOSIS — K210 Gastro-esophageal reflux disease with esophagitis: Secondary | ICD-10-CM | POA: Diagnosis not present

## 2015-05-04 DIAGNOSIS — J209 Acute bronchitis, unspecified: Secondary | ICD-10-CM | POA: Diagnosis not present

## 2015-05-04 DIAGNOSIS — I10 Essential (primary) hypertension: Secondary | ICD-10-CM | POA: Diagnosis not present

## 2015-05-04 DIAGNOSIS — E1165 Type 2 diabetes mellitus with hyperglycemia: Secondary | ICD-10-CM | POA: Diagnosis not present

## 2015-08-04 DIAGNOSIS — F329 Major depressive disorder, single episode, unspecified: Secondary | ICD-10-CM | POA: Diagnosis not present

## 2015-08-04 DIAGNOSIS — I10 Essential (primary) hypertension: Secondary | ICD-10-CM | POA: Diagnosis not present

## 2015-08-04 DIAGNOSIS — G63 Polyneuropathy in diseases classified elsewhere: Secondary | ICD-10-CM | POA: Diagnosis not present

## 2015-08-04 DIAGNOSIS — E1165 Type 2 diabetes mellitus with hyperglycemia: Secondary | ICD-10-CM | POA: Diagnosis not present

## 2015-11-03 DIAGNOSIS — Z23 Encounter for immunization: Secondary | ICD-10-CM | POA: Diagnosis not present

## 2015-11-03 DIAGNOSIS — I10 Essential (primary) hypertension: Secondary | ICD-10-CM | POA: Diagnosis not present

## 2015-11-03 DIAGNOSIS — G63 Polyneuropathy in diseases classified elsewhere: Secondary | ICD-10-CM | POA: Diagnosis not present

## 2015-11-03 DIAGNOSIS — E1165 Type 2 diabetes mellitus with hyperglycemia: Secondary | ICD-10-CM | POA: Diagnosis not present

## 2019-03-26 ENCOUNTER — Ambulatory Visit (INDEPENDENT_AMBULATORY_CARE_PROVIDER_SITE_OTHER): Payer: Medicare Other | Admitting: Family Medicine

## 2019-03-26 ENCOUNTER — Encounter (INDEPENDENT_AMBULATORY_CARE_PROVIDER_SITE_OTHER): Payer: Self-pay | Admitting: *Deleted

## 2019-03-26 ENCOUNTER — Other Ambulatory Visit: Payer: Self-pay

## 2019-03-26 ENCOUNTER — Encounter: Payer: Self-pay | Admitting: Family Medicine

## 2019-03-26 VITALS — BP 136/86 | HR 100 | Temp 97.2°F | Resp 15 | Ht 66.0 in | Wt 177.0 lb

## 2019-03-26 DIAGNOSIS — Z1231 Encounter for screening mammogram for malignant neoplasm of breast: Secondary | ICD-10-CM

## 2019-03-26 DIAGNOSIS — E1169 Type 2 diabetes mellitus with other specified complication: Secondary | ICD-10-CM | POA: Diagnosis not present

## 2019-03-26 DIAGNOSIS — Z1159 Encounter for screening for other viral diseases: Secondary | ICD-10-CM

## 2019-03-26 DIAGNOSIS — I10 Essential (primary) hypertension: Secondary | ICD-10-CM

## 2019-03-26 DIAGNOSIS — Z1211 Encounter for screening for malignant neoplasm of colon: Secondary | ICD-10-CM | POA: Diagnosis not present

## 2019-03-26 DIAGNOSIS — J441 Chronic obstructive pulmonary disease with (acute) exacerbation: Secondary | ICD-10-CM

## 2019-03-26 HISTORY — DX: Encounter for screening mammogram for malignant neoplasm of breast: Z12.31

## 2019-03-26 NOTE — Assessment & Plan Note (Signed)
[place] task for screening recommendations, ordered

## 2019-03-26 NOTE — Assessment & Plan Note (Signed)
Mammogram ordered

## 2019-03-26 NOTE — Assessment & Plan Note (Signed)
Needs updated labs pending findings will adjust medications as needed. She is encouraged to make sure that she stays on a diabetic, low-salt friendly diet.  And to walk frequently as possible.

## 2019-03-26 NOTE — Assessment & Plan Note (Signed)
Colonoscopy ordered.

## 2019-03-26 NOTE — Assessment & Plan Note (Signed)
Controlled but borderline control.  Possible adjustment of medications at future visit if BP does not improve or goes off.  Encouraged to maintain a DASH diet and get 30 minutes of exercise 5 days a week.

## 2019-03-26 NOTE — Patient Instructions (Addendum)
Happy New Year! May you have a year filled with hope, love, happiness and laughter.  I appreciate the opportunity to provide you with care for your health and wellness. Today we discussed: Establish care  Follow up: 3 month for annual   Labs today Referral for Colonoscopy Ordered Mammogram (at [facility])  Please continue to practice social distancing to keep you, your family, and our community safe.  If you must go out, please wear a mask and practice good handwashing.  It was a pleasure to see you and I look forward to continuing to work together on your health and well-being. Please do not hesitate to call the office if you need care or have questions about your care.  Have a wonderful day and week. With Gratitude, Cherly Beach, DNP, AGNP-BC

## 2019-03-26 NOTE — Assessment & Plan Note (Addendum)
Denies having any cough shortness of breath or COPD symptoms

## 2019-03-26 NOTE — Progress Notes (Signed)
Subjective:  Patient ID: Kim [name], female    DOB: January 19, 1956  Age: 64 y.o. MRN: [id number]  CC:  Chief Complaint  Patient presents with  . New Patient (Initial Visit)    establish care      HPI  HPI  Ms. Bensing is a 64 year old female patient of Dr. Luan Pulling previously.  She is very sweet and pleasant in communication.  Appears older than stated age.  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  Is not sure when her last mammogram or colonoscopy was but reports that she probably needs to get follow-up on those.  She believes she has had her tetanus in the last 10 years and she is following up with flu via the EMS and her county. She has a history of leukemia, diabetes without complication is on Glucophage and Jardiance, hypercholesteremia on pravastatin, hypertension on Hyzaar, seasonal allergies, neuropathy. She is a current everyday smoker smokes about half a pack a day. Denies alcohol or drug use.  Has 2 grown children who live in [place] wanting eating.  1 grandchild.  Reports she likes to play games on her phone to keep her mind sharp.  Does not eat as good as she could as she tries to fix foods that her mother would be interested in eating and she can have foods that she can have.  She drinks 2 cups of coffee in the morning.  Some coke throughout the day.  3 to 4 cups of water daily. Today she has no concerns while in the office.  Just reports that she would like to make sure she is up-to-date on everything.  Today patient denies signs and symptoms of COVID 19 infection including fever, chills, cough, shortness of breath, and headache. Past Medical, Surgical, Social History, Allergies, and Medications have been Reviewed.   Past Medical History:  Diagnosis Date  . Cancer [facility])    history of leukemia  . Community acquired pneumonia 05/26/2013  . Diabetes mellitus without complication ([place])   . Hypercholesteremia   . Hypertension    . Neuropathy   . Seasonal allergies     Current Meds  Medication Sig  . acetaminophen (TYLENOL) 500 MG tablet Take 500 mg by mouth every 6 (six) hours as needed. Pain.  [name] JARDIANCE 25 MG TABS tablet Take 25 mg by mouth daily.  [name] losartan-hydrochlorothiazide (HYZAAR) 100-25 MG per tablet Take 1 tablet by mouth daily.  . metFORMIN (GLUCOPHAGE) 1000 MG tablet Take 1,000 mg by mouth 2 (two) times daily with a meal.  . omeprazole (PRILOSEC) 20 MG capsule Take 20 mg by mouth daily as needed. Heartburn  . pravastatin (PRAVACHOL) 40 MG tablet Take 40 mg by mouth daily.  . silver sulfADIAZINE (SILVADENE) 1 % cream Apply 1 application topically 2 (two) times daily.    ROS:  Review of Systems  Constitutional: Negative.   HENT: Negative.   Eyes: Negative.   Respiratory: Negative.   Cardiovascular: Negative.   Gastrointestinal: Negative.   Genitourinary: Negative.   Musculoskeletal: Negative.   Skin: Negative.   Neurological: Negative.   Endo/Heme/Allergies: Negative.   Psychiatric/Behavioral: Negative.   All other systems reviewed and are negative.    Objective:   Today's Vitals: BP 136/86   Pulse 100   Temp (!) 97.2 F (36.2 C) (Temporal)   Resp 15   Ht 5\' 6"  (1.676 m)   Wt 177 lb (80.3 kg)   SpO2 95%  BMI 28.57 kg/m  Vitals with BMI 03/26/2019 05/28/2013 05/27/2013  Height 5\' 6"  - -  Weight 177 lbs - -  BMI [id number] - -  Systolic [id number]  Diastolic 86 73 81  Pulse [id number] 81 -     Physical Exam Vitals and nursing note reviewed.  Constitutional:      Appearance: Normal appearance. She is well-developed, well-groomed and overweight.  HENT:     Head: Normocephalic and atraumatic.     Comments: Mask in place     Right Ear: External ear normal.     Left Ear: External ear normal.  Eyes:     General:        Right eye: No discharge.        Left eye: No discharge.     Conjunctiva/sclera: Conjunctivae normal.  Cardiovascular:     Rate and Rhythm: Normal rate and regular  rhythm.     Pulses: Normal pulses.     Heart sounds: Normal heart sounds.  Pulmonary:     Effort: Pulmonary effort is normal.     Breath sounds: Normal breath sounds.  Musculoskeletal:        General: Normal range of motion.     Cervical back: Normal range of motion and neck supple.  Skin:    General: Skin is warm.  Neurological:     General: No focal deficit present.     Mental Status: She is alert and oriented to person, place, and time.  Psychiatric:        Attention and Perception: Attention normal.        Mood and Affect: Mood normal.        Speech: Speech normal.        Behavior: Behavior normal. Behavior is cooperative.        Thought Content: Thought content normal.        Cognition and Memory: Cognition normal.        Judgment: Judgment normal.     Assessment   1. Type 2 diabetes mellitus with other specified complication, without long-term current use of insulin ([place])   2. Essential hypertension   3. Encounter for screening mammogram for malignant neoplasm of breast   4. Encounter for screening for malignant neoplasm of colon   5. Encounter for hepatitis C screening test for low risk patient   6. COPD exacerbation ([place])     Tests ordered Orders Placed This Encounter  Procedures  . MM 3D SCREEN BREAST BILATERAL  . CBC  . COMPLETE METABOLIC PANEL WITH GFR  . Hemoglobin A1c  . Lipid panel  . HEP C AB W/REFL  . Ambulatory referral to Gastroenterology     Plan: Please see assessment and plan per problem list above.   No orders of the defined types were placed in this encounter.   Patient to follow-up in 06/23/2019   [name], NP

## 2019-03-27 ENCOUNTER — Other Ambulatory Visit: Payer: Self-pay | Admitting: Family Medicine

## 2019-03-27 ENCOUNTER — Telehealth: Payer: Self-pay

## 2019-03-27 DIAGNOSIS — E1169 Type 2 diabetes mellitus with other specified complication: Secondary | ICD-10-CM

## 2019-03-27 LAB — HEP C AB W/REFL
HEPATITIS C ANTIBODY REFILL$(REFL): NONREACTIVE
SIGNAL TO CUT-OFF: 0.01 (ref <1.00)

## 2019-03-27 LAB — COMPLETE METABOLIC PANEL WITH GFR
AG Ratio: 1.4 (calc) (ref 1.0–2.5)
ALT: 26 U/L (ref 6–29)
AST: 20 U/L (ref 10–35)
Albumin: 4.1 g/dL (ref 3.6–5.1)
Alkaline phosphatase (APISO): 123 U/L (ref 37–153)
BUN: 16 mg/dL (ref 7–25)
CO2: 29 mmol/L (ref 20–32)
Calcium: 9.8 mg/dL (ref 8.6–10.4)
Chloride: 98 mmol/L (ref 98–110)
Creat: 0.93 mg/dL (ref 0.50–0.99)
GFR, Est African American: 75 mL/min/{1.73_m2} (ref >=60)
GFR, Est Non African American: 65 mL/min/{1.73_m2} (ref >=60)
Globulin: 3 g/dL (calc) (ref 1.9–3.7)
Glucose, Bld: 250 mg/dL — ABNORMAL HIGH (ref 65–139)
Potassium: 3.7 mmol/L (ref 3.5–5.3)
Sodium: 138 mmol/L (ref 135–146)
Total Bilirubin: 0.4 mg/dL (ref 0.2–1.2)
Total Protein: 7.1 g/dL (ref 6.1–8.1)

## 2019-03-27 LAB — CBC
HCT: 46.8 % — ABNORMAL HIGH (ref 35.0–45.0)
Hemoglobin: 15.9 g/dL — ABNORMAL HIGH (ref 11.7–15.5)
MCH: 30 pg (ref 27.0–33.0)
MCHC: 34 g/dL (ref 32.0–36.0)
MCV: 88.3 fL (ref 80.0–100.0)
MPV: 10.6 fL (ref 7.5–12.5)
Platelets: 374 10*3/uL (ref 140–400)
RBC: 5.3 10*6/uL — ABNORMAL HIGH (ref 3.80–5.10)
RDW: 12.8 % (ref 11.0–15.0)
WBC: 10.9 10*3/uL — ABNORMAL HIGH (ref 3.8–10.8)

## 2019-03-27 LAB — LIPID PANEL
Cholesterol: 206 mg/dL — ABNORMAL HIGH (ref <200)
HDL: 48 mg/dL — ABNORMAL LOW (ref >=50)
LDL Cholesterol (Calc): 122 mg/dL (calc) — ABNORMAL HIGH
Non-HDL Cholesterol (Calc): 158 mg/dL (calc) — ABNORMAL HIGH (ref <130)
Total CHOL/HDL Ratio: 4.3 (calc) (ref <5.0)
Triglycerides: 249 mg/dL — ABNORMAL HIGH (ref <150)

## 2019-03-27 LAB — REFLEX TIQ

## 2019-03-27 LAB — HEMOGLOBIN A1C
Hgb A1c MFr Bld: 11.5 % of total Hgb — ABNORMAL HIGH (ref <5.7)
Mean Plasma Glucose: 283 (calc)
eAG (mmol/L): 15.7 (calc)

## 2019-03-27 MED ORDER — ATORVASTATIN CALCIUM 10 MG PO TABS: 10.0000 mg | ORAL_TABLET | Freq: Every day | ORAL | 0 refills | Status: DC

## 2019-03-27 NOTE — Telephone Encounter (Signed)
Advised of lab results with verbal understanding.

## 2019-03-27 NOTE — Telephone Encounter (Signed)
Pt is returning your call

## 2019-03-31 ENCOUNTER — Other Ambulatory Visit: Payer: Self-pay

## 2019-03-31 DIAGNOSIS — E1169 Type 2 diabetes mellitus with other specified complication: Secondary | ICD-10-CM

## 2019-03-31 DIAGNOSIS — E785 Hyperlipidemia, unspecified: Secondary | ICD-10-CM

## 2019-04-06 ENCOUNTER — Other Ambulatory Visit: Payer: Self-pay

## 2019-04-06 ENCOUNTER — Ambulatory Visit (HOSPITAL_COMMUNITY)
Admission: RE | Admit: 2019-04-06 | Discharge: 2019-04-06 | Disposition: A | Discharge status: Home / Self Care | Payer: Medicare Other | Source: Ambulatory Visit | Attending: Family Medicine | Admitting: Family Medicine

## 2019-04-06 DIAGNOSIS — Z1231 Encounter for screening mammogram for malignant neoplasm of breast: Secondary | ICD-10-CM | POA: Diagnosis not present

## 2019-04-20 ENCOUNTER — Telehealth: Payer: Self-pay

## 2019-04-20 ENCOUNTER — Other Ambulatory Visit: Payer: Self-pay

## 2019-04-20 DIAGNOSIS — E1169 Type 2 diabetes mellitus with other specified complication: Secondary | ICD-10-CM

## 2019-04-20 DIAGNOSIS — E785 Hyperlipidemia, unspecified: Secondary | ICD-10-CM

## 2019-04-20 MED ORDER — LOSARTAN POTASSIUM-HCTZ 100-25 MG PO TABS: 1.0000 | ORAL_TABLET | Freq: Every day | ORAL | 1 refills | Status: DC

## 2019-04-20 MED ORDER — ATORVASTATIN CALCIUM 10 MG PO TABS: 10.0000 mg | ORAL_TABLET | Freq: Every day | ORAL | 1 refills | Status: DC

## 2019-04-20 NOTE — Telephone Encounter (Signed)
Pt needs her BP & Cholesterol medication sent in .

## 2019-04-20 NOTE — Telephone Encounter (Signed)
Meds refilled.

## 2019-05-11 ENCOUNTER — Telehealth: Payer: Self-pay | Admitting: *Deleted

## 2019-05-11 NOTE — Telephone Encounter (Signed)
Pt needs metformin called in she is seeing dr nida in [place] but she will run out before this appt and just needs a temporary supply called in to hold her over

## 2019-05-12 ENCOUNTER — Other Ambulatory Visit: Payer: Self-pay

## 2019-05-12 MED ORDER — METFORMIN HCL 1000 MG PO TABS: 1000.0000 mg | ORAL_TABLET | Freq: Two times a day (BID) | ORAL | 0 refills | Status: DC

## 2019-05-12 NOTE — Telephone Encounter (Signed)
Refill sent in

## 2019-05-25 ENCOUNTER — Other Ambulatory Visit: Payer: Self-pay | Admitting: *Deleted

## 2019-05-25 DIAGNOSIS — E1169 Type 2 diabetes mellitus with other specified complication: Secondary | ICD-10-CM

## 2019-05-25 MED ORDER — ATORVASTATIN CALCIUM 10 MG PO TABS: 10.0000 mg | ORAL_TABLET | Freq: Every day | ORAL | 1 refills | Status: DC

## 2019-05-25 MED ORDER — LOSARTAN POTASSIUM-HCTZ 100-25 MG PO TABS: 1.0000 | ORAL_TABLET | Freq: Every day | ORAL | 1 refills | Status: DC

## 2019-06-04 ENCOUNTER — Other Ambulatory Visit: Payer: Self-pay

## 2019-06-04 ENCOUNTER — Telehealth: Payer: Self-pay | Admitting: "Endocrinology

## 2019-06-04 ENCOUNTER — Encounter: Payer: Self-pay | Admitting: "Endocrinology

## 2019-06-04 ENCOUNTER — Ambulatory Visit (INDEPENDENT_AMBULATORY_CARE_PROVIDER_SITE_OTHER): Payer: Medicare Other | Admitting: "Endocrinology

## 2019-06-04 VITALS — BP 132/70 | HR 88 | Resp 12 | Ht 66.0 in | Wt 179.4 lb

## 2019-06-04 DIAGNOSIS — I10 Essential (primary) hypertension: Secondary | ICD-10-CM

## 2019-06-04 DIAGNOSIS — E1165 Type 2 diabetes mellitus with hyperglycemia: Secondary | ICD-10-CM

## 2019-06-04 DIAGNOSIS — E782 Mixed hyperlipidemia: Secondary | ICD-10-CM

## 2019-06-04 MED ORDER — ACCU-CHEK GUIDE VI STRP: ORAL_STRIP | 2 refills | Status: DC

## 2019-06-04 MED ORDER — ACCU-CHEK GUIDE W/DEVICE KIT: 1.0000 | PACK | 0 refills | Status: DC

## 2019-06-04 MED ORDER — GLIPIZIDE ER 5 MG PO TB24: 5.0000 mg | ORAL_TABLET | Freq: Every day | ORAL | 3 refills | Status: DC

## 2019-06-04 NOTE — Telephone Encounter (Signed)
Dr nida said she is testing 4x so I clarified that with [facility] because they called back. She said they need a RX for lancets

## 2019-06-04 NOTE — Telephone Encounter (Signed)
Kim [name] at [facility] left a VM about her test strips. It says " use as instructed " they need to  Know how many times she is testing. Call back [phone]

## 2019-06-04 NOTE — Progress Notes (Signed)
Endocrinology Consult Note       06/04/2019, 5:18 PM   Subjective:    Patient ID: Kim [name], female    DOB: February 02, 1956.  Kim [name] is being seen in consultation for management of currently uncontrolled symptomatic diabetes requested by  [name], NP.   Past Medical History:  Diagnosis Date  . Cancer [facility])    history of leukemia  . Community acquired pneumonia 05/26/2013  . Diabetes mellitus without complication ([place])   . Hypercholesteremia   . Hypertension   . Neuropathy   . Seasonal allergies     Past Surgical History:  Procedure Laterality Date  . COLONOSCOPY  12/28/2011   Procedure: COLONOSCOPY;  Surgeon: Danie Binder, MD;  Location: [facility];  Service: Endoscopy;  Laterality: N/A;  9:30 AM-changed to 8:30 [name] to notify pt  . LAPAROSCOPY    . TUBAL LIGATION    . TUNNELED VENOUS CATHETER PLACEMENT      Social History   Socioeconomic History  . Marital status: Divorced    Spouse name: Not on file  . Number of children: 2  . Years of education: Not on file  . Highest education level: 11th grade  Occupational History  . Not on file  Tobacco Use  . Smoking status: Current Every Day Smoker    Packs/day: 0.50    Years: 40.00    Pack years: 20.00  . Smokeless tobacco: Never Used  Substance and Sexual Activity  . Alcohol use: No  . Drug use: No  . Sexual activity: Not Currently  Other Topics Concern  . Not on file  Social History Narrative   Lives with 85 year old mother -caregiver    2 grown children   Son [name] in [place]    Daughter [name] in [place] with daughter [name]       Enjoys: shopping, play games       Diet: not as good a she would like, eats all food groups   Caffeine: 2 cups of coffee in the mornings, coke daily   Water: 3-4 cups daily       Wear seat belt    Smoke detectors at home   Does not use phone while  driving      Social Determinants of Health   Financial Resource Strain:   . Difficulty of Paying Living Expenses:   Food Insecurity:   . Worried About Charity fundraiser in the Last Year:   . Arboriculturist in the Last Year:   Transportation Needs:   . Film/video editor (Medical):   [name] Lack of Transportation (Non-Medical):   Physical Activity:   . Days of Exercise per Week:   . Minutes of Exercise per Session:   Stress:   . Feeling of Stress :   Social Connections:   . Frequency of Communication with Friends and Family:   . Frequency of Social Gatherings with Friends and Family:   . Attends Religious Services:   . Active Member of Clubs or Organizations:   . Attends Archivist Meetings:   [name] Marital  Status:     Family History  Problem Relation Age of Onset  . Hypothyroidism Mother   . Diabetes Mother   . Hypertension Mother   . Thyroid disease Mother   . Hypertension Father   . Stroke Father   . Colon cancer Neg Hx     Outpatient Encounter Medications as of 06/04/2019  Medication Sig  . acetaminophen (TYLENOL) 500 MG tablet Take 500 mg by mouth every 6 (six) hours as needed. Pain.  [name] atorvastatin (LIPITOR) 10 MG tablet Take 1 tablet (10 mg total) by mouth daily at 6 PM.  . Blood Glucose Monitoring Suppl (ACCU-CHEK GUIDE) w/Device KIT 1 Piece by Does not apply route as directed.  [name] glipiZIDE (GLUCOTROL XL) 5 MG 24 hr tablet Take 1 tablet (5 mg total) by mouth daily with breakfast.  . glucose blood (ACCU-CHEK GUIDE) test strip Use as instructed  . losartan-hydrochlorothiazide (HYZAAR) 100-25 MG tablet Take 1 tablet by mouth daily.  . metFORMIN (GLUCOPHAGE) 1000 MG tablet Take 1 tablet (1,000 mg total) by mouth 2 (two) times daily with a meal.  . omeprazole (PRILOSEC) 20 MG capsule Take 20 mg by mouth daily as needed. Heartburn  . silver sulfADIAZINE (SILVADENE) 1 % cream Apply 1 application topically 2 (two) times daily.  . [DISCONTINUED] JARDIANCE 25 MG  TABS tablet Take 25 mg by mouth daily.   No facility-administered encounter medications on file as of 06/04/2019.    ALLERGIES: No Known Allergies  VACCINATION STATUS: Immunization History  Administered Date(s) Administered  . Pneumococcal Polysaccharide-23 05/27/2013    Diabetes She presents for her initial diabetic visit. She has type 2 diabetes mellitus. Onset time: She was diagnosed at approximate age of 64 years. Her disease course has been worsening. There are no hypoglycemic associated symptoms. Pertinent negatives for hypoglycemia include no confusion, headaches, pallor or seizures. Associated symptoms include blurred vision, fatigue, polydipsia and polyuria. Pertinent negatives for diabetes include no chest pain and no polyphagia. There are no hypoglycemic complications. Symptoms are worsening. There are no diabetic complications. Risk factors for coronary artery disease include diabetes mellitus, dyslipidemia, hypertension, family history, tobacco exposure, sedentary lifestyle and post-menopausal. Current diabetic treatment includes oral agent (monotherapy). Her weight is fluctuating minimally. She is following a generally unhealthy diet. When asked about meal planning, she reported none. She has not had a previous visit with a dietitian. She rarely (She is wheelchair-bound due to deconditioning/disequilibrium.  ) participates in exercise. (She did not bring any logs nor meter to review today.  Her most recent A1c was 11.5%.  She is only on Metformin 1000 mg p.o. twice daily.) An ACE inhibitor/angiotensin II receptor blocker is being taken. Eye exam is current.  Hyperlipidemia This is a chronic problem. The current episode started more than 1 year ago. Exacerbating diseases include diabetes. Pertinent negatives include no chest pain, myalgias or shortness of breath. Current antihyperlipidemic treatment includes statins. Risk factors for coronary artery disease include diabetes mellitus,  hypertension, dyslipidemia, family history, post-menopausal and a sedentary lifestyle.  Hypertension This is a chronic problem. The current episode started more than 1 year ago. Associated symptoms include blurred vision. Pertinent negatives include no chest pain, headaches, palpitations or shortness of breath. Risk factors for coronary artery disease include diabetes mellitus, dyslipidemia, family history, post-menopausal state, smoking/tobacco exposure and sedentary lifestyle.     Review of Systems  Constitutional: Positive for fatigue. Negative for chills, fever and unexpected weight change.  HENT: Negative for trouble swallowing and voice change.   Eyes:  Positive for blurred vision. Negative for visual disturbance.  Respiratory: Negative for cough, shortness of breath and wheezing.   Cardiovascular: Negative for chest pain, palpitations and leg swelling.  Gastrointestinal: Negative for diarrhea, nausea and vomiting.  Endocrine: Positive for polydipsia and polyuria. Negative for cold intolerance, heat intolerance and polyphagia.  Musculoskeletal: Positive for gait problem. Negative for arthralgias and myalgias.       She is wheelchair-bound due to deconditioning/disequilibrium.  Skin: Negative for color change, pallor, rash and wound.  Neurological: Negative for seizures and headaches.  Psychiatric/Behavioral: Negative for confusion and suicidal ideas.    Objective:    Vitals with BMI 06/04/2019 03/26/2019 05/28/2013  Height 5' 6"  5' 6"  -  Weight 179 lbs 6 oz 177 lbs -  BMI 17.51 02.58 -  Systolic 527 782 423  Diastolic 70 86 73  Pulse 88 100 81    BP 132/70   Pulse 88   Resp 12   Ht 5' 6"  (1.676 m)   Wt 179 lb 6.4 oz (81.4 kg)   BMI 28.96 kg/m   Wt Readings from Last 3 Encounters:  06/04/19 179 lb 6.4 oz (81.4 kg)  03/26/19 177 lb (80.3 kg)  05/26/13 190 lb 12.8 oz (86.5 kg)     Physical Exam Constitutional:      Appearance: She is well-developed.  HENT:     Head:  Normocephalic and atraumatic.  Neck:     Thyroid: No thyromegaly.     Trachea: No tracheal deviation.  Cardiovascular:     Rate and Rhythm: Normal rate and regular rhythm.  Pulmonary:     Effort: Pulmonary effort is normal.     Breath sounds: Normal breath sounds.  Abdominal:     Tenderness: There is no abdominal tenderness. There is no guarding.  Musculoskeletal:     Cervical back: Normal range of motion and neck supple.     Comments: She is wheelchair-bound.  Skin:    General: Skin is warm and dry.     Coloration: Skin is not pale.     Findings: No erythema or rash.  Neurological:     Mental Status: She is alert and oriented to person, place, and time.     Cranial Nerves: No cranial nerve deficit.     Coordination: Coordination normal.     Deep Tendon Reflexes: Reflexes are normal and symmetric.  Psychiatric:        Judgment: Judgment normal.    CMP ( most recent) CMP     Component Value Date/Time   NA 138 03/26/2019 1517   K 3.7 03/26/2019 1517   CL 98 03/26/2019 1517   CO2 29 03/26/2019 1517   GLUCOSE 250 (H) 03/26/2019 1517   BUN 16 03/26/2019 1517   CREATININE 0.93 03/26/2019 1517   CALCIUM 9.8 03/26/2019 1517   PROT 7.1 03/26/2019 1517   ALBUMIN 2.6 (L) 05/27/2013 0524   AST 20 03/26/2019 1517   ALT 26 03/26/2019 1517   ALKPHOS 98 05/27/2013 0524   BILITOT 0.4 03/26/2019 1517   GFRNONAA 65 03/26/2019 1517   GFRAA 75 03/26/2019 1517    Diabetic Labs (most recent): Lab Results  Component Value Date   HGBA1C 11.5 (H) 03/26/2019   HGBA1C 7.8 (H) 05/27/2013    Lipid Panel     Component Value Date/Time   CHOL 206 (H) 03/26/2019 1517   TRIG 249 (H) 03/26/2019 1517   HDL 48 (L) 03/26/2019 1517   CHOLHDL 4.3 03/26/2019 1517   LDLCALC 122 (H)  03/26/2019 1517     Assessment & Plan:   1. Uncontrolled type 2 diabetes mellitus with hyperglycemia ([place])  - MARYUM BATTERSON has currently uncontrolled symptomatic type 2 DM since  64 years of age,  with most  recent A1c of 11.5 %. Recent labs reviewed. - I had a long discussion with her about the progressive nature of diabetes and the pathology behind its complications. -her diabetes is complicated by sedentary life, smoking and she remains at a high risk for more acute and chronic complications which include CAD, CVA, CKD, retinopathy, and neuropathy. These are all discussed in detail with her.  - I have counseled her on diet  and weight management  by adopting a carbohydrate restricted/protein rich diet. Patient is encouraged to switch to  unprocessed or minimally processed     complex starch and increased protein intake (animal or plant source), fruits, and vegetables. -  she is advised to stick to a routine mealtimes to eat 3 meals  a day and avoid unnecessary snacks ( to snack only to correct hypoglycemia).   - she admits that there is a room for improvement in her food and drink choices. - Suggestion is made for her to avoid simple carbohydrates  from her diet including Cakes, Sweet Desserts, Ice Cream, Soda (diet and regular), Sweet Tea, Candies, Chips, Cookies, Store Bought Juices, Alcohol in Excess of  1-2 drinks a day, Artificial Sweeteners,  Coffee Creamer, and "Sugar-free" Products. This will help patient to have more stable blood glucose profile and potentially avoid unintended weight gain.  - she will be scheduled with Jearld Fenton, RDN, CDE for diabetes education.  - I have approached her with the following individualized plan to manage  her diabetes and patient agrees:   - she will likely require at least basal insulin in order for her to achieve control of diabetes to target.  She is hesitant to go on insulin today.  She is approached to start strict monitoring of glucose 4 times a day-before meals and at bedtime and return in 10 days for evaluation.  - she is encouraged to call clinic for blood glucose levels less than 70 or above 300 mg /dl. - she is advised to continue Metformin  1000 mg p.o. twice daily, therapeutically suitable for patient . -She may benefit from low-dose glipizide.  I discussed and added glipizide 5 mg XL p.o. at breakfast.  - she is not a candidate for SGLT2 inhibitors nor incretin therapy.   - Specific targets for  A1c;  LDL, HDL,  and Triglycerides were discussed with the patient.  2) Blood Pressure /Hypertension:  her blood pressure is  controlled to target.   she is advised to continue her current medications including losartan/HCTZ 100/25 mg p.o. daily with breakfast .  3) Lipids/Hyperlipidemia:   Review of her recent lipid panel showed  uncontrolled  LDL at 122 .  she  is advised to continue    atorvastatin 10 mg daily at bedtime.  Side effects and precautions discussed with her.  4)  Weight/Diet:  Body mass index is 28.96 kg/m.  -She is not a candidate for major weight loss.   I discussed with her the fact that loss of 5 - 10% of her  current body weight will have the most impact on her diabetes management.  Exercise, and detailed carbohydrates information provided  -  detailed on discharge instructions.  5) Chronic Care/Health Maintenance:  -she  is on ACEI/ARB and Statin medications and  is encouraged to initiate and continue to follow up with Ophthalmology, Dentist,  Podiatrist at least yearly or according to recommendations, and advised to  Quit smoking. I have recommended yearly flu vaccine and pneumonia vaccine at least every 5 years;  and  sleep for at least 7 hours a day.  She is wheelchair-bound, cannot exercise optimally.  - she is  advised to maintain close follow up with [name], NP for primary care needs, as well as her other providers for optimal and coordinated care.   - Time spent in this patient care: 60 min, of which > 50% was spent in  counseling  her about her currently uncontrolled, complicated type 2 diabetes; hyperlipidemia; hypertension and the rest reviewing her blood glucose logs , discussing her hypoglycemia  and hyperglycemia episodes, reviewing her current and  previous labs / studies  ( including abstraction from other facilities) and medications  doses and developing a  long term treatment plan based on the latest standards of care/ guidelines; and documenting her care.    Please refer to Patient Instructions for Blood Glucose Monitoring and Insulin/Medications Dosing Guide"  in media tab for additional information. Please  also refer to " Patient Self Inventory" in the Media  tab for reviewed elements of pertinent patient history.  [name] participated in the discussions, expressed understanding, and voiced agreement with the above plans.  All questions were answered to her satisfaction. she is encouraged to contact clinic should she have any questions or concerns prior to her return visit.   Follow up plan: - Return in about 10 days (around 06/14/2019) for Follow up with Meter and Logs Only - no Labs.  Glade Lloyd, MD [facility] [address] [place], [place] [address] Phone: [phone]  Fax: [phone]    06/04/2019, 5:18 PM  This note was partially dictated with voice recognition software. Similar sounding words can be transcribed inadequately or may not  be corrected upon review.

## 2019-06-04 NOTE — Patient Instructions (Signed)

## 2019-06-05 MED ORDER — LANCETS MISC: 1.0000 | Freq: Four times a day (QID) | 1 refills | Status: DC

## 2019-06-05 NOTE — Telephone Encounter (Signed)
Rx for lancets sent

## 2019-06-15 ENCOUNTER — Telehealth: Payer: Self-pay | Admitting: "Endocrinology

## 2019-06-15 MED ORDER — METFORMIN HCL 1000 MG PO TABS: 1000.0000 mg | ORAL_TABLET | Freq: Two times a day (BID) | ORAL | 0 refills | Status: DC

## 2019-06-15 NOTE — Telephone Encounter (Signed)
Patient requesting refill on metFORMIN (GLUCOPHAGE) 1000 MG tablet. [facility]

## 2019-06-15 NOTE — Telephone Encounter (Signed)
Rx refill sent.

## 2019-06-22 ENCOUNTER — Other Ambulatory Visit: Payer: Self-pay | Admitting: *Deleted

## 2019-06-22 MED ORDER — LOSARTAN POTASSIUM-HCTZ 100-25 MG PO TABS: 1.0000 | ORAL_TABLET | Freq: Every day | ORAL | 1 refills | Status: DC

## 2019-06-23 ENCOUNTER — Ambulatory Visit (INDEPENDENT_AMBULATORY_CARE_PROVIDER_SITE_OTHER): Payer: Medicare Other | Admitting: Family Medicine

## 2019-06-23 ENCOUNTER — Encounter: Payer: Self-pay | Admitting: Family Medicine

## 2019-06-23 ENCOUNTER — Other Ambulatory Visit: Payer: Self-pay

## 2019-06-23 ENCOUNTER — Ambulatory Visit (INDEPENDENT_AMBULATORY_CARE_PROVIDER_SITE_OTHER): Payer: Medicare Other | Admitting: "Endocrinology

## 2019-06-23 ENCOUNTER — Encounter: Payer: Self-pay | Admitting: "Endocrinology

## 2019-06-23 VITALS — BP 120/78 | HR 96 | Temp 97.3°F | Resp 16 | Ht 66.0 in | Wt 175.0 lb

## 2019-06-23 VITALS — BP 122/82 | HR 109 | Ht 66.0 in | Wt 174.8 lb

## 2019-06-23 DIAGNOSIS — Z0001 Encounter for general adult medical examination with abnormal findings: Secondary | ICD-10-CM | POA: Diagnosis not present

## 2019-06-23 DIAGNOSIS — J441 Chronic obstructive pulmonary disease with (acute) exacerbation: Secondary | ICD-10-CM

## 2019-06-23 DIAGNOSIS — Z124 Encounter for screening for malignant neoplasm of cervix: Secondary | ICD-10-CM

## 2019-06-23 DIAGNOSIS — Z23 Encounter for immunization: Secondary | ICD-10-CM

## 2019-06-23 DIAGNOSIS — Z1211 Encounter for screening for malignant neoplasm of colon: Secondary | ICD-10-CM

## 2019-06-23 DIAGNOSIS — E782 Mixed hyperlipidemia: Secondary | ICD-10-CM

## 2019-06-23 DIAGNOSIS — I10 Essential (primary) hypertension: Secondary | ICD-10-CM | POA: Diagnosis not present

## 2019-06-23 DIAGNOSIS — E559 Vitamin D deficiency, unspecified: Secondary | ICD-10-CM

## 2019-06-23 DIAGNOSIS — E785 Hyperlipidemia, unspecified: Secondary | ICD-10-CM | POA: Insufficient documentation

## 2019-06-23 DIAGNOSIS — Z1159 Encounter for screening for other viral diseases: Secondary | ICD-10-CM

## 2019-06-23 DIAGNOSIS — Z114 Encounter for screening for human immunodeficiency virus [HIV]: Secondary | ICD-10-CM

## 2019-06-23 DIAGNOSIS — E1165 Type 2 diabetes mellitus with hyperglycemia: Secondary | ICD-10-CM

## 2019-06-23 DIAGNOSIS — E1169 Type 2 diabetes mellitus with other specified complication: Secondary | ICD-10-CM | POA: Diagnosis not present

## 2019-06-23 DIAGNOSIS — Z780 Asymptomatic menopausal state: Secondary | ICD-10-CM | POA: Insufficient documentation

## 2019-06-23 MED ORDER — ATORVASTATIN CALCIUM 10 MG PO TABS: 10.0000 mg | ORAL_TABLET | Freq: Every day | ORAL | 5 refills | Status: DC

## 2019-06-23 MED ORDER — METFORMIN HCL 1000 MG PO TABS: 1000.0000 mg | ORAL_TABLET | Freq: Two times a day (BID) | ORAL | 5 refills | Status: DC

## 2019-06-23 MED ORDER — LOSARTAN POTASSIUM-HCTZ 100-25 MG PO TABS: 1.0000 | ORAL_TABLET | Freq: Every day | ORAL | 5 refills | Status: DC

## 2019-06-23 NOTE — Progress Notes (Signed)
Health Maintenance reviewed -   Immunization History  Administered Date(s) Administered  . Pneumococcal Polysaccharide-23 05/27/2013, 06/23/2019   Last Pap smear: Due one last time Last mammogram: 03/2019 Last colonoscopy: Due 12/2021 Last DEXA: Ordered today Dentist: Needs to be seen -thinks she needs dentures  Ophtho: Referral made today Exercise: Not able to walk well    Other doctors caring for patient include:  Patient Care Team: [name], NP as PCP - General (Family Medicine)  End of Life Discussion:  Patient does not have a living will and medical power of attorney    Code Status: Prior   Subjective:   HPI  Kim [name] is a 64 y.o. female who presents for annual wellness visit and follow-up on chronic medical conditions.  She has the following concerns: none  Denies any falls, uses wheelchair regularly to get around. Denies skin wounds or rash. Denies vision changes. Denies changes in appetite-has been working on the diet   Denies bladder or bowel changes. Denies memory changes.  Review Of Systems  Review of Systems  Constitutional: Negative.   HENT: Negative.   Eyes: Negative.   Respiratory: Negative.   Cardiovascular: Negative.   Gastrointestinal: Negative.   Endocrine: Negative.   Genitourinary: Negative.   Musculoskeletal: Negative.   Skin: Negative.   Allergic/Immunologic: Negative.   Neurological: Negative.   Hematological: Negative.   Psychiatric/Behavioral: Negative.   All other systems reviewed and are negative.   Objective:   PHYSICAL EXAM:  BP 120/78   Pulse 96   Temp (!) 97.3 F (36.3 C) (Temporal)   Resp 16   Ht 5\' 6"  (1.676 m)   Wt 175 lb (79.4 kg)   SpO2 96%   BMI 28.25 kg/m   Physical Exam Vitals and nursing note reviewed.  Constitutional:      Appearance: Normal appearance. She is well-developed, well-groomed and overweight.  HENT:     Head: Normocephalic and atraumatic.     Right Ear: Hearing,  tympanic membrane, ear canal and external ear normal.     Left Ear: Hearing, tympanic membrane, ear canal and external ear normal.     Nose: Nose normal.     Mouth/Throat:     Mouth: Mucous membranes are moist.     Pharynx: Oropharynx is clear.     Comments: Missing teeth, carries,  Eyes:     General: Lids are normal.     Extraocular Movements: Extraocular movements intact.     Conjunctiva/sclera: Conjunctivae normal.     Pupils: Pupils are equal, round, and reactive to light.  Neck:     Thyroid: No thyroid mass, thyromegaly or thyroid tenderness.  Cardiovascular:     Rate and Rhythm: Normal rate and regular rhythm.     Pulses: Normal pulses.          Radial pulses are 2+ on the right side and 2+ on the left side.       Dorsalis pedis pulses are 2+ on the right side and 2+ on the left side.       Posterior tibial pulses are 2+ on the right side and 2+ on the left side.     Heart sounds: Normal heart sounds.  Pulmonary:     Effort: Pulmonary effort is normal.     Breath sounds: Normal breath sounds.  Abdominal:     General: Abdomen is flat. Bowel sounds are normal.     Palpations: Abdomen is soft.  Musculoskeletal:  General: Normal range of motion.     Cervical back: Normal range of motion and neck supple.     Right lower leg: No edema.     Left lower leg: No edema.     Comments: wheelchair  Feet:     Right foot:     Protective Sensation: 5 sites tested. 5 sites sensed.     Skin integrity: Skin integrity normal.     Toenail Condition: Right toenails are abnormally thick. Fungal disease present.    Left foot:     Protective Sensation: 5 sites tested. 5 sites sensed.     Skin integrity: Skin integrity normal.     Toenail Condition: Left toenails are abnormally thick. Fungal disease present.    Comments: Healed ulcer noted on outer hallux valgus of right foot  Lymphadenopathy:     Cervical: No cervical adenopathy.  Skin:    General: Skin is warm and dry.      Capillary Refill: Capillary refill takes less than 2 seconds.  Neurological:     General: No focal deficit present.     Mental Status: She is alert and oriented to person, place, and time. Mental status is at baseline.     Cranial Nerves: Cranial nerves are intact.     Sensory: Sensation is intact.     Motor: Weakness present.     Coordination: Coordination is intact.     Gait: Gait abnormal.     Deep Tendon Reflexes: Reflexes are normal and symmetric.  Psychiatric:        Attention and Perception: Attention normal.        Mood and Affect: Mood normal.        Behavior: Behavior normal. Behavior is cooperative.        Thought Content: Thought content normal.        Judgment: Judgment normal.      Depression Screening  Depression screen [facility] 2/9 03/26/2019  Decreased Interest 0  Down, Depressed, Hopeless 2  PHQ - 2 Score 2  Altered sleeping 1  Tired, decreased energy 2  Change in appetite 0  Feeling bad or failure about yourself  1  Trouble concentrating 0  Moving slowly or fidgety/restless 0  Suicidal thoughts 0  PHQ-9 Score 6    Diabetic Foot Form - Detailed   Diabetic Foot Exam - detailed Diabetic Foot exam was performed with the following findings: Yes 06/23/2019  1:57 PM  Can the patient see the bottom of their feet?: Yes Are the shoes appropriate in style and fit?: Yes Is there swelling or and abnormal foot shape?: No Is there a claw toe deformity?: No Is there elevated skin temparature?: No Is there foot or ankle muscle weakness?: No Normal Range of Motion: No Pulse Foot Exam completed.: Yes  Right posterior Tibialias: Present Left posterior Tibialias: Present  Right Dorsalis Pedis: Present Left Dorsalis Pedis: Present  Sensory Foot Exam Completed.: Yes Semmes-Weinstein Monofilament Test R Site 1-Great Toe: Pos L Site 1-Great Toe: Pos    Comments: Sensory intact bilaterally        Assessment & Plan:   1. Annual visit for general adult medical examination  with abnormal findings   2. Uncontrolled type 2 diabetes mellitus with hyperglycemia ([place])   3. Hyperlipidemia associated with type 2 diabetes mellitus ([place])   4. COPD exacerbation ([place])   5. Essential hypertension, benign   6. Mixed hyperlipidemia   7. Encounter for screening for malignant neoplasm of cervix   8. Post-menopausal  9. Encounter for hepatitis C screening test for low risk patient   10. Encounter for screening for human immunodeficiency virus (HIV)   11. Vitamin D deficiency   12. Encounter for administration of vaccine   13. Special screening for malignant neoplasms, colon   14. Encounter for screening for malignant neoplasm of colon     Tests ordered Orders Placed This Encounter  Procedures  . DG Bone Density  . Pneumococcal polysaccharide vaccine 23-valent greater than or equal to 2yo subcutaneous/IM  . CBC  . COMPLETE METABOLIC PANEL WITH GFR  . Hemoglobin A1c  . Lipid panel  . VITAMIN D 25 Hydroxy (Vit-D Deficiency, Fractures)  . HIV Antibody (routine testing w rflx)  . Hepatitis C Antibody  . Cologuard  . Ambulatory referral to Ophthalmology  . Ambulatory referral to Obstetrics / Gynecology     Plan: Please see assessment and plan per problem list above.   Meds ordered this encounter  Medications  . atorvastatin (LIPITOR) 10 MG tablet    Sig: Take 1 tablet (10 mg total) by mouth daily at 6 PM.    Dispense:  30 tablet    Refill:  5  . losartan-hydrochlorothiazide (HYZAAR) 100-25 MG tablet    Sig: Take 1 tablet by mouth daily.    Dispense:  30 tablet    Refill:  5  . metFORMIN (GLUCOPHAGE) 1000 MG tablet    Sig: Take 1 tablet (1,000 mg total) by mouth 2 (two) times daily with a meal.    Dispense:  180 tablet    Refill:  5    I have personally reviewed: The patient's medical and social history Their use of alcohol, tobacco or illicit drugs Their current medications and supplements The patient's functional ability including ADLs,fall risks,  home safety risks, cognitive, and hearing and visual impairment Diet and physical activities Evidence for depression or mood disorders  The patient's weight, height, BMI, and visual acuity have been recorded in the chart.  I have made referrals, counseling, and provided education to the patient based on review of the above and I have provided the patient with a written personalized care plan for preventive services.     [name], NP   06/23/2019

## 2019-06-23 NOTE — Patient Instructions (Signed)

## 2019-06-23 NOTE — Assessment & Plan Note (Signed)
[place] task force recommendation 

## 2019-06-23 NOTE — Assessment & Plan Note (Addendum)
Discussed monthly self breast exams and yearly mammograms; at least 30 minutes of aerobic activity at least 5 days/week and weight-bearing exercise 2x/week; proper sunscreen use reviewed; healthy diet, including goals of calcium and vitamin D intake and alcohol recommendations (less than or equal to 1 drink/day) reviewed; regular seatbelt use; changing batteries in smoke detectors.  Immunization recommendations discussed.  Colonoscopy recommendations reviewed.   Provided with pneumonia vaccine 23.  Patient was educated on the recommendation for vaccine. After obtaining informed consent, the vaccine was administered no adverse effects noted at time of administration. Patient provided with education on arm soreness and use of tylenol or ibuprofen (if safe) for this. Encourage to use the arm vaccine was given in to help reduce the soreness. Patient educated on the signs of a reaction to the vaccine and advised to contact the office should these occur.

## 2019-06-23 NOTE — Patient Instructions (Signed)
I appreciate the opportunity to provide you with care for your health and wellness. Today we discussed: overall health  Follow up: 6 months morning for fasting labs same day  Labs-fasting day of next appt (morning)  Referrals today: eye dr and pap needs  Pneumonia vaccine today-last one you will ever need.  Continue all medications as ordered  Please continue to practice social distancing to keep you, your family, and our community safe.  If you must go out, please wear a mask and practice good handwashing.  It was a pleasure to see you and I look forward to continuing to work together on your health and well-being. Please do not hesitate to call the office if you need care or have questions about your care.  Have a wonderful day and week. With Gratitude, Cherly Beach, DNP, AGNP-BC  HEALTH MAINTENANCE RECOMMENDATIONS:  It is recommended that you get at least 30 minutes of aerobic exercise at least 5 days/week (for weight loss, you may need as much as 60-90 minutes). This can be any activity that gets your heart rate up. This can be divided in 10-15 minute intervals if needed, but try and build up your endurance at least once a week.  Weight bearing exercise is also recommended twice weekly.  Eat a healthy diet with lots of vegetables, fruits and fiber.  "Colorful" foods have a lot of vitamins (ie green vegetables, tomatoes, red peppers, etc).  Limit sweet tea, regular sodas and alcoholic beverages, all of which has a lot of calories and sugar.  Up to 1 alcoholic drink daily may be beneficial for women (unless trying to lose weight, watch sugars).  Drink a lot of water.  Calcium recommendations are 1200-1500 mg daily (1500 mg for postmenopausal women or women without ovaries), and vitamin D 1000 IU daily.  This should be obtained from diet and/or supplements (vitamins), and calcium should not be taken all at once, but in divided doses.  Monthly self breast exams and yearly mammograms for  women over the age of 100 is recommended.  Sunscreen of at least SPF 30 should be used on all sun-exposed parts of the skin when outside between the hours of 10 am and 4 pm (not just when at beach or pool, but even with exercise, golf, tennis, and yard work!)  Use a sunscreen that says "broad spectrum" so it covers both UVA and UVB rays, and make sure to reapply every 1-2 hours.  Remember to change the batteries in your smoke detectors when changing your clock times in the spring and fall.  Use your seat belt every time you are in a car, and please drive safely and not be distracted with cell phones and texting while driving.

## 2019-06-23 NOTE — Progress Notes (Signed)
06/23/2019, 5:37 PM   Endocrinology follow-up note  Subjective:    Patient ID: Kim [name], female    DOB: [birth year].  Kim [name] is being seen in follow-up after she was seen in consultation for management of currently uncontrolled symptomatic diabetes requested by  [name], NP.   Past Medical History:  Diagnosis Date  . Cancer [facility])    history of leukemia  . Community acquired pneumonia 05/26/2013  . Diabetes mellitus without complication ([place])   . Encounter for screening mammogram for malignant neoplasm of breast 03/26/2019  . Hypercholesteremia   . Hypertension   . Neuropathy   . Seasonal allergies     Past Surgical History:  Procedure Laterality Date  . COLONOSCOPY  12/28/2011   Procedure: COLONOSCOPY;  Surgeon: Danie Binder, MD;  Location: [facility];  Service: Endoscopy;  Laterality: N/A;  9:30 AM-changed to 8:30 [name] to notify pt  . LAPAROSCOPY    . TUBAL LIGATION    . TUNNELED VENOUS CATHETER PLACEMENT      Social History   Socioeconomic History  . Marital status: Divorced    Spouse name: Not on file  . Number of children: 2  . Years of education: Not on file  . Highest education level: 11th grade  Occupational History  . Not on file  Tobacco Use  . Smoking status: Current Every Day Smoker    Packs/day: 0.50    Years: 40.00    Pack years: 20.00  . Smokeless tobacco: Never Used  Substance and Sexual Activity  . Alcohol use: No  . Drug use: No  . Sexual activity: Not Currently  Other Topics Concern  . Not on file  Social History Narrative   Lives with 67 year old mother -caregiver    2 grown children   Son [name] in [place]    Daughter [name] in [place] with daughter [name]       Enjoys: shopping, play games       Diet: not as good a she would like, eats all food groups   Caffeine: 2 cups of coffee in the mornings,  coke daily   Water: 3-4 cups daily       Wear seat belt    Smoke detectors at home   Does not use phone while driving      Social Determinants of Health   Financial Resource Strain:   . Difficulty of Paying Living Expenses:   Food Insecurity:   . Worried About Charity fundraiser in the Last Year:   . Arboriculturist in the Last Year:   Transportation Needs:   . Film/video editor (Medical):   [name] Lack of Transportation (Non-Medical):   Physical Activity:   . Days of Exercise per Week:   . Minutes of Exercise per Session:   Stress:   . Feeling of Stress :   Social Connections:   . Frequency of Communication with Friends and Family:   . Frequency of Social Gatherings with Friends and Family:   . Attends Religious Services:   .  Active Member of Clubs or Organizations:   . Attends Archivist Meetings:   [name] Marital Status:     Family History  Problem Relation Age of Onset  . Hypothyroidism Mother   . Diabetes Mother   . Hypertension Mother   . Thyroid disease Mother   . Hypertension Father   . Stroke Father   . Colon cancer Neg Hx     Outpatient Encounter Medications as of 06/23/2019  Medication Sig  . acetaminophen (TYLENOL) 500 MG tablet Take 500 mg by mouth every 6 (six) hours as needed. Pain.  [name] atorvastatin (LIPITOR) 10 MG tablet Take 1 tablet (10 mg total) by mouth daily at 6 PM.  . Blood Glucose Monitoring Suppl (ACCU-CHEK GUIDE) w/Device KIT 1 Piece by Does not apply route as directed.  [name] glipiZIDE (GLUCOTROL XL) 5 MG 24 hr tablet Take 1 tablet (5 mg total) by mouth daily with breakfast.  . glucose blood (ACCU-CHEK GUIDE) test strip Use as instructed  . Lancets MISC 1 each by Does not apply route 4 (four) times daily. Use as directed to check blood glucose four times daily  . losartan-hydrochlorothiazide (HYZAAR) 100-25 MG tablet Take 1 tablet by mouth daily.  . metFORMIN (GLUCOPHAGE) 1000 MG tablet Take 1 tablet (1,000 mg total) by mouth 2 (two)  times daily with a meal.  . omeprazole (PRILOSEC) 20 MG capsule Take 20 mg by mouth daily as needed. Heartburn  . silver sulfADIAZINE (SILVADENE) 1 % cream Apply 1 application topically 2 (two) times daily.   No facility-administered encounter medications on file as of 06/23/2019.    ALLERGIES: No Known Allergies  VACCINATION STATUS: Immunization History  Administered Date(s) Administered  . Pneumococcal Polysaccharide-23 05/27/2013, 06/23/2019    Diabetes She presents for her follow-up diabetic visit. She has type 2 diabetes mellitus. Onset time: She was diagnosed at approximate age of 43 years. Her disease course has been improving. There are no hypoglycemic associated symptoms. Pertinent negatives for hypoglycemia include no confusion, headaches, pallor or seizures. Associated symptoms include blurred vision and fatigue. Pertinent negatives for diabetes include no chest pain, no polydipsia, no polyphagia and no polyuria. There are no hypoglycemic complications. Symptoms are worsening. There are no diabetic complications. Risk factors for coronary artery disease include diabetes mellitus, dyslipidemia, hypertension, family history, tobacco exposure, sedentary lifestyle and post-menopausal. Current diabetic treatment includes oral agent (monotherapy). Her weight is decreasing steadily. She is following a generally unhealthy diet. When asked about meal planning, she reported none. She has not had a previous visit with a dietitian. She rarely (She is wheelchair-bound due to deconditioning/disequilibrium.  ) participates in exercise. Her home blood glucose trend is decreasing steadily. (She presents her log which shows significantly improved glycemic profile to near target levels.      Her most recent A1c was 11.5%.  She is only on Metformin 1000 mg p.o. twice daily.) An ACE inhibitor/angiotensin II receptor blocker is being taken. Eye exam is current.  Hyperlipidemia This is a chronic problem. The  current episode started more than 1 year ago. Exacerbating diseases include diabetes. Pertinent negatives include no chest pain, myalgias or shortness of breath. Current antihyperlipidemic treatment includes statins. Risk factors for coronary artery disease include diabetes mellitus, hypertension, dyslipidemia, family history, post-menopausal and a sedentary lifestyle.  Hypertension This is a chronic problem. The current episode started more than 1 year ago. Associated symptoms include blurred vision. Pertinent negatives include no chest pain, headaches, palpitations or shortness of breath. Risk factors for coronary  artery disease include diabetes mellitus, dyslipidemia, family history, post-menopausal state, smoking/tobacco exposure and sedentary lifestyle.     Review of Systems  Constitutional: Positive for fatigue. Negative for chills, fever and unexpected weight change.  HENT: Negative for trouble swallowing and voice change.   Eyes: Positive for blurred vision. Negative for visual disturbance.  Respiratory: Negative for cough, shortness of breath and wheezing.   Cardiovascular: Negative for chest pain, palpitations and leg swelling.  Gastrointestinal: Negative for diarrhea, nausea and vomiting.  Endocrine: Negative for cold intolerance, heat intolerance, polydipsia, polyphagia and polyuria.  Musculoskeletal: Positive for gait problem. Negative for arthralgias and myalgias.       She is wheelchair-bound due to deconditioning/disequilibrium.  Skin: Negative for color change, pallor, rash and wound.  Neurological: Negative for seizures and headaches.  Psychiatric/Behavioral: Negative for confusion and suicidal ideas.    Objective:    Vitals with BMI 06/23/2019 06/23/2019 06/04/2019  Height _0  _1  _2   Weight 174 lbs 13 oz 175 lbs 179 lbs 6 oz  BMI 28.23 76.22 63.33  Systolic 545 625 638  Diastolic 82 78 70  Pulse 937 96 88    BP 122/82   Pulse (!) 109   Ht _3  (1.676 m)    Wt 174 lb 12.8 oz (79.3 kg)   BMI 28.21 kg/m   Wt Readings from Last 3 Encounters:  06/23/19 174 lb 12.8 oz (79.3 kg)  06/23/19 175 lb (79.4 kg)  06/04/19 179 lb 6.4 oz (81.4 kg)     Physical Exam Constitutional:      Appearance: She is well-developed.  HENT:     Head: Normocephalic and atraumatic.  Neck:     Thyroid: No thyromegaly.     Trachea: No tracheal deviation.  Cardiovascular:     Rate and Rhythm: Normal rate and regular rhythm.  Pulmonary:     Effort: Pulmonary effort is normal.     Breath sounds: Normal breath sounds.  Abdominal:     Tenderness: There is no abdominal tenderness. There is no guarding.  Musculoskeletal:     Cervical back: Normal range of motion and neck supple.     Comments: She is wheelchair-bound.  Skin:    General: Skin is warm and dry.     Coloration: Skin is not pale.     Findings: No erythema or rash.  Neurological:     Mental Status: She is alert and oriented to person, place, and time.     Cranial Nerves: No cranial nerve deficit.     Coordination: Coordination normal.     Deep Tendon Reflexes: Reflexes are normal and symmetric.  Psychiatric:        Judgment: Judgment normal.    CMP ( most recent) CMP     Component Value Date/Time   NA 138 03/26/2019 1517   K 3.7 03/26/2019 1517   CL 98 03/26/2019 1517   CO2 29 03/26/2019 1517   GLUCOSE 250 (H) 03/26/2019 1517   BUN 16 03/26/2019 1517   CREATININE 0.93 03/26/2019 1517   CALCIUM 9.8 03/26/2019 1517   PROT 7.1 03/26/2019 1517   ALBUMIN 2.6 (L) 05/27/2013 0524   AST 20 03/26/2019 1517   ALT 26 03/26/2019 1517   ALKPHOS 98 05/27/2013 0524   BILITOT 0.4 03/26/2019 1517   GFRNONAA 65 03/26/2019 1517   GFRAA 75 03/26/2019 1517    Diabetic Labs (most recent): Lab Results  Component Value Date   HGBA1C 11.5 (H) 03/26/2019   HGBA1C 7.8 (H)  05/27/2013    Lipid Panel     Component Value Date/Time   CHOL 206 (H) 03/26/2019 1517   TRIG 249 (H) 03/26/2019 1517   HDL 48 (L)  03/26/2019 1517   CHOLHDL 4.3 03/26/2019 1517   LDLCALC 122 (H) 03/26/2019 1517     Assessment & Plan:   1. Uncontrolled type 2 diabetes mellitus with hyperglycemia ([place])  - Kim [name] has currently uncontrolled symptomatic type 2 DM since  64 years of age.   She presents her log which shows significantly improved glycemic profile to near target levels.      Her most recent A1c was 11.5%.  Recent labs reviewed. - I had a long discussion with her about the progressive nature of diabetes and the pathology behind its complications. -her diabetes is complicated by sedentary life, smoking and she remains at a high risk for more acute and chronic complications which include CAD, CVA, CKD, retinopathy, and neuropathy. These are all discussed in detail with her.  - I have counseled her on diet  and weight management  by adopting a carbohydrate restricted/protein rich diet. Patient is encouraged to switch to  unprocessed or minimally processed     complex starch and increased protein intake (animal or plant source), fruits, and vegetables. -  she is advised to stick to a routine mealtimes to eat 3 meals  a day and avoid unnecessary snacks ( to snack only to correct hypoglycemia).   - she  admits there is a room for improvement in her diet and drink choices. -  Suggestion is made for her to avoid simple carbohydrates  from her diet including Cakes, Sweet Desserts / Pastries, Ice Cream, Soda (diet and regular), Sweet Tea, Candies, Chips, Cookies, Sweet Pstries,  Store Bought Juices, Alcohol in Excess of  1-2 drinks a day, Artificial Sweeteners, Coffee Creamer, and "Sugar-free" Products. This will help patient to have stable blood glucose profile and potentially avoid unintended weight gain.  - she will be scheduled with Jearld Fenton, RDN, CDE for diabetes education.  - I have approached her with the following individualized plan to manage  her diabetes and patient agrees:   -Based on her  presentation with near target glycemic profile, she will not need insulin treatment at this time.  She wishes to avoid insulin treatment if she cannot.    She is willing to continue to monitor blood glucose 2 times a day-daily before breakfast and at bedtime.   - she is encouraged to call clinic for blood glucose levels less than 70 or above 300 mg /dl. - she is advised to continue Metformin 1000 mg daily-daily after breakfast and after supper, and glipizide 5 mg XL p.o. daily at breakfast.    - she is not a candidate for SGLT2 inhibitors nor incretin therapy.   - Specific targets for  A1c;  LDL, HDL,  and Triglycerides were discussed with the patient.  2) Blood Pressure /Hypertension: Her blood pressure is controlled to target.  she is advised to continue her current medications including losartan/HCTZ 100/25 mg p.o. daily with breakfast .  3) Lipids/Hyperlipidemia:   Review of her recent lipid panel showed  uncontrolled  LDL at 122 .  she  is advised to continue    atorvastatin 10 mg daily at bedtime.  Side effects and precautions discussed with her.  4)  Weight/Diet:  Body mass index is 28.21 kg/m.  -She is not a candidate for major weight loss.   I discussed with  her the fact that loss of 5 - 10% of her  current body weight will have the most impact on her diabetes management.  Exercise, and detailed carbohydrates information provided  -  detailed on discharge instructions.  5) Chronic Care/Health Maintenance:  -she  is on ACEI/ARB and Statin medications and  is encouraged to initiate and continue to follow up with Ophthalmology, Dentist,  Podiatrist at least yearly or according to recommendations, and advised to  Quit smoking. I have recommended yearly flu vaccine and pneumonia vaccine at least every 5 years;  and  sleep for at least 7 hours a day.  She is wheelchair-bound, cannot exercise optimally.  - she is  advised to maintain close follow up with [name], NP for primary care  needs, as well as her other providers for optimal and coordinated care.   - Time spent on this patient care encounter:  35 min, of which > 50% was spent in  counseling and the rest reviewing her blood glucose logs , discussing her hypoglycemia and hyperglycemia episodes, reviewing her current and  previous labs / studies  ( including abstraction from other facilities) and medications  doses and developing a  long term treatment plan and documenting her care.   Please refer to Patient Instructions for Blood Glucose Monitoring and Insulin/Medications Dosing Guide"  in media tab for additional information. Please  also refer to " Patient Self Inventory" in the Media  tab for reviewed elements of pertinent patient history.  Kim [name] participated in the discussions, expressed understanding, and voiced agreement with the above plans.  All questions were answered to her satisfaction. she is encouraged to contact clinic should she have any questions or concerns prior to her return visit.  Follow up plan: - Return in about 9 weeks (around 08/25/2019) for Bring Meter and Logs- A1c in Office.  Glade Lloyd, MD [facility] [address] [place], [place] [address] Phone: [phone]  Fax: [phone]    06/23/2019, 5:37 PM  This note was partially dictated with voice recognition software. Similar sounding words can be transcribed inadequately or may not  be corrected upon review.

## 2019-06-23 NOTE — Assessment & Plan Note (Signed)
Is now being followed by Dr. Dorris Fetch.  Encouraged her to keep his care plan and to take his medications as directed.  She reports that she is working on her diet as best she can.  Foot exam today in the office.  Is currently taking a statin medication.  Is on blood pressure medication as well.  Overall doing well and denies having any excessive low blood sugars or excessively high blood sugars reports she has an appointment today with Dr. Needed to review her blood sugar log.

## 2019-06-23 NOTE — Assessment & Plan Note (Signed)
DEXA scan ordered.  Encouraged vitamin D and calcium intake.

## 2019-06-23 NOTE — Assessment & Plan Note (Signed)
Better controlled today in office.  Continue current medications as directed.  Encouraged DASH diet.  And to get 30 minutes of exercise even if it is in a chair to do chair exercises as she can tolerate.

## 2019-06-23 NOTE — Assessment & Plan Note (Signed)
She denies having any cough, shortness of breath, COPD exacerbation or symptoms today in the office.

## 2019-06-23 NOTE — Assessment & Plan Note (Signed)
Declined getting colonoscopy rather do Cologuard ordered today.

## 2019-06-23 NOTE — Assessment & Plan Note (Signed)
Needs 1 last Pap smear.  She is unsure when the last one was.

## 2019-06-23 NOTE — Assessment & Plan Note (Signed)
Vitamin D deficiency we will check vitamin D level.

## 2019-06-23 NOTE — Assessment & Plan Note (Signed)
Encouraged a heart healthy low-fat diet.  That is diabetic compliant.  Continue Lipitor at this time.

## 2019-07-23 ENCOUNTER — Ambulatory Visit: Payer: Medicare Other | Admitting: Family Medicine

## 2019-07-28 ENCOUNTER — Telehealth: Payer: Self-pay | Admitting: Adult Health

## 2019-07-28 NOTE — Telephone Encounter (Signed)
Called to discuss COVID19 vaccine homebound initiative.  Patient states she is waiting to hear back from health department and will call me about this tomorrow.  Wilber Bihari, NP

## 2019-07-28 NOTE — Telephone Encounter (Signed)
Called patient to review her [place] homebound vaccine referral.  Asked that she call me back so we can review her screening which is required prior to scheduling her vaccine.    Wilber Bihari, NP

## 2019-07-30 ENCOUNTER — Telehealth: Payer: Self-pay | Admitting: Unknown Physician Specialty

## 2019-07-30 ENCOUNTER — Telehealth: Payer: Self-pay | Admitting: Physician Assistant

## 2019-07-30 NOTE — Telephone Encounter (Addendum)
I connected by phone with Kim [name] and/or patient's caregiver on 07/30/2019 at 5:11 PM to discuss the potential vaccination through our Homebound vaccination initiative.   Prevaccination Checklist for COVID-19 Vaccines  1.  Are you feeling sick today? no  2.  Have you ever received a dose of a COVID-19 vaccine?  no      If yes, which one? None   3.  Have you ever had an allergic reaction: (This would include a severe reaction [ e.g., anaphylaxis] that required treatment with epinephrine or EpiPen or that caused you to go to the hospital.  It would also include an allergic reaction that occurred within 4 hours that caused hives, swelling, or respiratory distress, including wheezing.) A.  A previous dose of COVID-19 vaccine. no  B.  A vaccine or injectable therapy that contains multiple components, one of which is a COVID-19 vaccine component, but it is not known which component elicited the immediate reaction. no  C.  Are you allergic to polyethylene glycol? no   4.  Have you ever had an allergic reaction to another vaccine (other than COVID-19 vaccine) or an injectable medication? (This would include a severe reaction [ e.g., anaphylaxis] that required treatment with epinephrine or EpiPen or that caused you to go to the hospital.  It would also include an allergic reaction that occurred within 4 hours that caused hives, swelling, or respiratory distress, including wheezing.)  no   5.  Have you ever had a severe allergic reaction (e.g., anaphylaxis) to something other than a component of the COVID-19 vaccine, or any vaccine or injectable medication?  This would include food, pet, venom, environmental, or oral medication allergies.  no   6.  Have you received any vaccine in the last 14 days? no   7.  Have you ever had a positive test for COVID-19 or has a doctor ever told you that you had COVID-19?  no   8.  Have you received passive antibody therapy (monoclonal antibodies or convalescent  serum) as a treatment for COVID-19? no   9.  Do you have a weakened immune system caused by something such as HIV infection or cancer or do you take immunosuppressive drugs or therapies?  no   10.  Do you have a bleeding disorder or are you taking a blood thinner? no   11.  Are you pregnant or breast-feeding? no   12.  Do you have dermal fillers? no   __________________   This patient is a 65 y.o. female that meets the FDA criteria to receive homebound vaccination. Patient or parent/caregiver understands they have the option to accept or refuse homebound vaccination.  Patient passed the pre-screening checklist and would like to proceed with homebound vaccination.  Based on questionnaire above, I recommend the patient be observed for 15 minutes.  There is one other household members/caregivers who are also interested in receiving the vaccine.     I will send the patient's information to our scheduling team who will reach out to schedule the patient and potential caregiver/family members for homebound vaccination.   Lives with mother Kim [name] MRN:  [id number] who also needs vaccination.   Kim [name] 07/30/2019 5:11 PM

## 2019-07-30 NOTE — Telephone Encounter (Signed)
Called r/e homebound vaccine initiative.  Pt state she will be getting your vaccine on 6/19 at her doctor's office visit.

## 2019-08-12 ENCOUNTER — Other Ambulatory Visit: Payer: Self-pay

## 2019-08-12 ENCOUNTER — Emergency Department (HOSPITAL_COMMUNITY)
Admission: EM | Admit: 2019-08-12 | Discharge: 2019-08-12 | Discharge status: Against Medical Advice | Payer: Medicare Other

## 2019-08-12 NOTE — ED Triage Notes (Signed)
Registration notified triage RN that pt left prior to being seen in triage. 

## 2019-08-24 ENCOUNTER — Telehealth: Payer: Self-pay | Admitting: Adult Health

## 2019-08-24 NOTE — Telephone Encounter (Signed)
I connected by phone with Kim [name] and/or patient's caregiver on 08/24/2019 at 1:06 PM to discuss the potential vaccination through our Homebound vaccination initiative.   Prevaccination Checklist for COVID-19 Vaccines  1.  Are you feeling sick today? no  2.  Have you ever received a dose of a COVID-19 vaccine?  no      If yes, which one? None   3.  Have you ever had an allergic reaction: (This would include a severe reaction [ e.g., anaphylaxis] that required treatment with epinephrine or EpiPen or that caused you to go to the hospital.  It would also include an allergic reaction that occurred within 4 hours that caused hives, swelling, or respiratory distress, including wheezing.) A.  A previous dose of COVID-19 vaccine. no  B.  A vaccine or injectable therapy that contains multiple components, one of which is a COVID-19 vaccine component, but it is not known which component elicited the immediate reaction. no  C.  Are you allergic to polyethylene glycol? no  D. Are you allergic to Polysorbate, which is found in some vaccines, film coated tablets and intravenous steroids?  no   4.  Have you ever had an allergic reaction to another vaccine (other than COVID-19 vaccine) or an injectable medication? (This would include a severe reaction [ e.g., anaphylaxis] that required treatment with epinephrine or EpiPen or that caused you to go to the hospital.  It would also include an allergic reaction that occurred within 4 hours that caused hives, swelling, or respiratory distress, including wheezing.)  no   5.  Have you ever had a severe allergic reaction (e.g., anaphylaxis) to something other than a component of the COVID-19 vaccine, or any vaccine or injectable medication?  This would include food, pet, venom, environmental, or oral medication allergies.  no   6.  Have you received any vaccine in the last 14 days? no   7.  Have you ever had a positive test for COVID-19 or has a doctor ever told  you that you had COVID-19?  no   8.  Have you received passive antibody therapy (monoclonal antibodies or convalescent serum) as a treatment for COVID-19? no   9.  Do you have a weakened immune system caused by something such as HIV infection or cancer or do you take immunosuppressive drugs or therapies?  history of leukemia in remission diagnosed 36 years ago, received chemotherapy no BMT   10.  Do you have a bleeding disorder or are you taking a blood thinner? no   11.  Are you pregnant or breast-feeding? no   12.  Do you have dermal fillers? no   __________________   This patient is a 64 y.o. female that meets the FDA criteria to receive homebound vaccination. Patient or parent/caregiver understands they have the option to accept or refuse homebound vaccination.  Patient passed the pre-screening checklist and would like to proceed with homebound vaccination.  Based on questionnaire above, I recommend the patient be observed for 15 minutes.  There are an estimated #0 other household members/caregivers who are also interested in receiving the vaccine.      I will send the patient's information to our scheduling team who will reach out to schedule the patient and potential caregiver/family members for homebound vaccination.    Scot Dock 08/24/2019 1:06 PM

## 2019-09-01 ENCOUNTER — Other Ambulatory Visit: Payer: Self-pay

## 2019-09-01 ENCOUNTER — Ambulatory Visit (INDEPENDENT_AMBULATORY_CARE_PROVIDER_SITE_OTHER): Payer: Medicare Other | Admitting: "Endocrinology

## 2019-09-01 ENCOUNTER — Encounter: Payer: Self-pay | Admitting: "Endocrinology

## 2019-09-01 VITALS — BP 110/70 | HR 96 | Ht 66.0 in | Wt 170.0 lb

## 2019-09-01 DIAGNOSIS — I10 Essential (primary) hypertension: Secondary | ICD-10-CM

## 2019-09-01 DIAGNOSIS — E782 Mixed hyperlipidemia: Secondary | ICD-10-CM | POA: Diagnosis not present

## 2019-09-01 DIAGNOSIS — E1165 Type 2 diabetes mellitus with hyperglycemia: Secondary | ICD-10-CM

## 2019-09-01 LAB — POCT GLYCOSYLATED HEMOGLOBIN (HGB A1C): Hemoglobin A1C: 6.9 % — AB (ref 4.0–5.6)

## 2019-09-01 NOTE — Patient Instructions (Signed)

## 2019-09-01 NOTE — Progress Notes (Signed)
09/01/2019, 12:41 PM   Endocrinology follow-up note  Subjective:    Patient ID: Kim [name], female    DOB: June 08, 1955.  Kim [name] is being seen in follow-up after she was seen in consultation for management of currently uncontrolled symptomatic diabetes requested by  [name], NP.   Past Medical History:  Diagnosis Date  . Cancer [facility])    history of leukemia  . Community acquired pneumonia 05/26/2013  . Diabetes mellitus without complication ([place])   . Encounter for screening mammogram for malignant neoplasm of breast 03/26/2019  . Hypercholesteremia   . Hypertension   . Neuropathy   . Seasonal allergies     Past Surgical History:  Procedure Laterality Date  . COLONOSCOPY  12/28/2011   Procedure: COLONOSCOPY;  Surgeon: Danie Binder, MD;  Location: [facility];  Service: Endoscopy;  Laterality: N/A;  9:30 AM-changed to 8:30 [name] to notify pt  . LAPAROSCOPY    . TUBAL LIGATION    . TUNNELED VENOUS CATHETER PLACEMENT      Social History   Socioeconomic History  . Marital status: Divorced    Spouse name: Not on file  . Number of children: 2  . Years of education: Not on file  . Highest education level: 11th grade  Occupational History  . Not on file  Tobacco Use  . Smoking status: Current Every Day Smoker    Packs/day: 0.50    Years: 40.00    Pack years: 20.00  . Smokeless tobacco: Never Used  Substance and Sexual Activity  . Alcohol use: No  . Drug use: No  . Sexual activity: Not Currently  Other Topics Concern  . Not on file  Social History Narrative   Lives with 60 year old mother -caregiver    2 grown children   Son [name] in [place]    Daughter [name] in [place] with daughter [name]       Enjoys: shopping, play games       Diet: not as good a she would like, eats all food groups   Caffeine: 2 cups of coffee in the mornings,  coke daily   Water: 3-4 cups daily       Wear seat belt    Smoke detectors at home   Does not use phone while driving      Social Determinants of Health   Financial Resource Strain:   . Difficulty of Paying Living Expenses:   Food Insecurity:   . Worried About Charity fundraiser in the Last Year:   . Arboriculturist in the Last Year:   Transportation Needs:   . Film/video editor (Medical):   [name] Lack of Transportation (Non-Medical):   Physical Activity:   . Days of Exercise per Week:   . Minutes of Exercise per Session:   Stress:   . Feeling of Stress :   Social Connections:   . Frequency of Communication with Friends and Family:   . Frequency of Social Gatherings with Friends and Family:   . Attends Religious Services:   .  Active Member of Clubs or Organizations:   . Attends Archivist Meetings:   [name] Marital Status:     Family History  Problem Relation Age of Onset  . Hypothyroidism Mother   . Diabetes Mother   . Hypertension Mother   . Thyroid disease Mother   . Hypertension Father   . Stroke Father   . Colon cancer Neg Hx     Outpatient Encounter Medications as of 09/01/2019  Medication Sig  . acetaminophen (TYLENOL) 500 MG tablet Take 500 mg by mouth every 6 (six) hours as needed. Pain.  [name] atorvastatin (LIPITOR) 10 MG tablet Take 1 tablet (10 mg total) by mouth daily at 6 PM.  . Blood Glucose Monitoring Suppl (ACCU-CHEK GUIDE) w/Device KIT 1 Piece by Does not apply route as directed.  [name] glipiZIDE (GLUCOTROL XL) 5 MG 24 hr tablet Take 1 tablet (5 mg total) by mouth daily with breakfast.  . glucose blood (ACCU-CHEK GUIDE) test strip Use as instructed  . Lancets MISC 1 each by Does not apply route 4 (four) times daily. Use as directed to check blood glucose four times daily  . losartan-hydrochlorothiazide (HYZAAR) 100-25 MG tablet Take 1 tablet by mouth daily.  . metFORMIN (GLUCOPHAGE) 1000 MG tablet Take 1 tablet (1,000 mg total) by mouth 2 (two)  times daily with a meal.  . omeprazole (PRILOSEC) 20 MG capsule Take 20 mg by mouth daily as needed. Heartburn  . silver sulfADIAZINE (SILVADENE) 1 % cream Apply 1 application topically 2 (two) times daily.   No facility-administered encounter medications on file as of 09/01/2019.    ALLERGIES: No Known Allergies  VACCINATION STATUS: Immunization History  Administered Date(s) Administered  . Pneumococcal Polysaccharide-23 05/27/2013, 06/23/2019    Diabetes She presents for her follow-up diabetic visit. She has type 2 diabetes mellitus. Onset time: She was diagnosed at approximate age of 44 years. Her disease course has been improving. There are no hypoglycemic associated symptoms. Pertinent negatives for hypoglycemia include no confusion, headaches, pallor or seizures. Associated symptoms include blurred vision and fatigue. Pertinent negatives for diabetes include no chest pain, no polydipsia, no polyphagia and no polyuria. There are no hypoglycemic complications. Symptoms are worsening. There are no diabetic complications. Risk factors for coronary artery disease include diabetes mellitus, dyslipidemia, hypertension, family history, tobacco exposure, sedentary lifestyle and post-menopausal. Current diabetic treatment includes oral agent (monotherapy). Her weight is decreasing steadily. She is following a generally unhealthy diet. When asked about meal planning, she reported none. She has not had a previous visit with a dietitian. She rarely (She is wheelchair-bound due to deconditioning/disequilibrium.  ) participates in exercise. Her home blood glucose trend is decreasing steadily. Her breakfast blood glucose range is generally 110-130 mg/dl. Her bedtime blood glucose range is generally 130-140 mg/dl. Her overall blood glucose range is 130-140 mg/dl. (She presents with her meter and logs showing continued improvement in her glycemic profile to near target levels.  Her point-of-care A1c is 6.9%  improving from 11.5%.  ) An ACE inhibitor/angiotensin II receptor blocker is being taken. Eye exam is current.  Hyperlipidemia This is a chronic problem. The current episode started more than 1 year ago. Exacerbating diseases include diabetes. Pertinent negatives include no chest pain, myalgias or shortness of breath. Current antihyperlipidemic treatment includes statins. Risk factors for coronary artery disease include diabetes mellitus, hypertension, dyslipidemia, family history, post-menopausal and a sedentary lifestyle.  Hypertension This is a chronic problem. The current episode started more than 1 year ago. Associated symptoms  include blurred vision. Pertinent negatives include no chest pain, headaches, palpitations or shortness of breath. Risk factors for coronary artery disease include diabetes mellitus, dyslipidemia, family history, post-menopausal state, smoking/tobacco exposure and sedentary lifestyle.     Review of Systems  Constitutional: Positive for fatigue. Negative for chills, fever and unexpected weight change.  HENT: Negative for trouble swallowing and voice change.   Eyes: Positive for blurred vision. Negative for visual disturbance.  Respiratory: Negative for cough, shortness of breath and wheezing.   Cardiovascular: Negative for chest pain, palpitations and leg swelling.  Gastrointestinal: Negative for diarrhea, nausea and vomiting.  Endocrine: Negative for cold intolerance, heat intolerance, polydipsia, polyphagia and polyuria.  Musculoskeletal: Positive for gait problem. Negative for arthralgias and myalgias.       She is wheelchair-bound due to deconditioning/disequilibrium.  Skin: Negative for color change, pallor, rash and wound.  Neurological: Negative for seizures and headaches.  Psychiatric/Behavioral: Negative for confusion and suicidal ideas.    Objective:    Vitals with BMI 09/01/2019 06/23/2019 06/23/2019  Height 5' 6"  5' 6"  5' 6"   Weight 170 lbs 174 lbs 13  oz 175 lbs  BMI 27.45 07.37 10.62  Systolic 694 854 627  Diastolic 70 82 78  Pulse 96 109 96    BP 110/70   Pulse 96   Ht 5' 6"  (1.676 m)   Wt 170 lb (77.1 kg)   BMI 27.44 kg/m   Wt Readings from Last 3 Encounters:  09/01/19 170 lb (77.1 kg)  06/23/19 174 lb 12.8 oz (79.3 kg)  06/23/19 175 lb (79.4 kg)     Physical Exam Constitutional:      Appearance: She is well-developed.  HENT:     Head: Normocephalic and atraumatic.  Neck:     Thyroid: No thyromegaly.     Trachea: No tracheal deviation.  Cardiovascular:     Rate and Rhythm: Normal rate and regular rhythm.  Pulmonary:     Effort: Pulmonary effort is normal.     Breath sounds: Normal breath sounds.  Abdominal:     Tenderness: There is no abdominal tenderness. There is no guarding.  Musculoskeletal:     Cervical back: Normal range of motion and neck supple.     Comments: She is wheelchair-bound.  Skin:    General: Skin is warm and dry.     Coloration: Skin is not pale.     Findings: No erythema or rash.  Neurological:     Mental Status: She is alert and oriented to person, place, and time.     Cranial Nerves: No cranial nerve deficit.     Coordination: Coordination normal.     Deep Tendon Reflexes: Reflexes are normal and symmetric.  Psychiatric:        Judgment: Judgment normal.    CMP ( most recent) CMP     Component Value Date/Time   NA 138 03/26/2019 1517   K 3.7 03/26/2019 1517   CL 98 03/26/2019 1517   CO2 29 03/26/2019 1517   GLUCOSE 250 (H) 03/26/2019 1517   BUN 16 03/26/2019 1517   CREATININE 0.93 03/26/2019 1517   CALCIUM 9.8 03/26/2019 1517   PROT 7.1 03/26/2019 1517   ALBUMIN 2.6 (L) 05/27/2013 0524   AST 20 03/26/2019 1517   ALT 26 03/26/2019 1517   ALKPHOS 98 05/27/2013 0524   BILITOT 0.4 03/26/2019 1517   GFRNONAA 65 03/26/2019 1517   GFRAA 75 03/26/2019 1517    Diabetic Labs (most recent): Lab Results  Component Value  Date   HGBA1C 6.9 (A) 09/01/2019   HGBA1C 11.5 (H)  03/26/2019   HGBA1C 7.8 (H) 05/27/2013    Lipid Panel     Component Value Date/Time   CHOL 206 (H) 03/26/2019 1517   TRIG 249 (H) 03/26/2019 1517   HDL 48 (L) 03/26/2019 1517   CHOLHDL 4.3 03/26/2019 1517   LDLCALC 122 (H) 03/26/2019 1517     Assessment & Plan:   1. Uncontrolled type 2 diabetes mellitus with hyperglycemia ([place])  - Kim [name] has currently uncontrolled symptomatic type 2 DM since  64 years of age.   She presents her log which shows significantly improved glycemic profile to near target levels.   She presents with her meter and logs showing continued improvement in her glycemic profile to near target levels.  Her point-of-care A1c is 6.9% improving from 11.5%.   Recent labs reviewed. - I had a long discussion with her about the progressive nature of diabetes and the pathology behind its complications. -her diabetes is complicated by sedentary life, smoking and she remains at a high risk for more acute and chronic complications which include CAD, CVA, CKD, retinopathy, and neuropathy. These are all discussed in detail with her.  - I have counseled her on diet  and weight management  by adopting a carbohydrate restricted/protein rich diet. Patient is encouraged to switch to  unprocessed or minimally processed     complex starch and increased protein intake (animal or plant source), fruits, and vegetables. -  she is advised to stick to a routine mealtimes to eat 3 meals  a day and avoid unnecessary snacks ( to snack only to correct hypoglycemia).   - she  admits there is a room for improvement in her diet and drink choices. -  Suggestion is made for her to avoid simple carbohydrates  from her diet including Cakes, Sweet Desserts / Pastries, Ice Cream, Soda (diet and regular), Sweet Tea, Candies, Chips, Cookies, Sweet Pastries,  Store Bought Juices, Alcohol in Excess of  1-2 drinks a day, Artificial Sweeteners, Coffee Creamer, and "Sugar-free" Products. This will help  patient to have stable blood glucose profile and potentially avoid unintended weight gain.   - she will be scheduled with Jearld Fenton, RDN, CDE for diabetes education.  - I have approached her with the following individualized plan to manage  her diabetes and patient agrees:   -Based on her presentation with near target glycemic profile, she will not need insulin treatment at this time.   She wishes to avoid insulin treatment if she cannot.    She is willing to continue to monitor blood glucose 2 times a day-daily before breakfast and at bedtime.   - she is encouraged to call clinic for blood glucose levels less than 70 or greater than 200 mg/Dl at fasting.  - she is advised to continue Metformin 1000 mg daily-daily after breakfast and after supper, and glipizide 5 mg XL p.o. daily at breakfast.    - she is not a candidate for SGLT2 inhibitors nor incretin therapy.   - Specific targets for  A1c;  LDL, HDL,  and Triglycerides were discussed with the patient.  2) Blood Pressure /Hypertension: Her blood pressure is controlled to target.  she is advised to continue her current medications including losartan/HCTZ 100/25 mg p.o. daily with breakfast .  3) Lipids/Hyperlipidemia:   Review of her recent lipid panel showed  uncontrolled  LDL at 122 .  she  is advised to continue atorvastatin  10 mg daily at bedtime.  Side effects and precautions discussed with her.  4)  Weight/Diet:  Body mass index is 27.44 kg/m.  -She is not a candidate for major weight loss.   I discussed with her the fact that loss of 5 - 10% of her  current body weight will have the most impact on her diabetes management.  Exercise, and detailed carbohydrates information provided  -  detailed on discharge instructions.  5) Chronic Care/Health Maintenance:  -she  is on ACEI/ARB and Statin medications and  is encouraged to initiate and continue to follow up with Ophthalmology, Dentist,  Podiatrist at least yearly or according  to recommendations, and advised to  Quit smoking. I have recommended yearly flu vaccine and pneumonia vaccine at least every 5 years;  and  sleep for at least 7 hours a day.  She is wheelchair-bound, cannot exercise optimally.  - she is  advised to maintain close follow up with [name], NP for primary care needs, as well as her other providers for optimal and coordinated care.   - Time spent on this patient care encounter:  35 min, of which > 50% was spent in  counseling and the rest reviewing her blood glucose logs , discussing her hypoglycemia and hyperglycemia episodes, reviewing her current and  previous labs / studies  ( including abstraction from other facilities) and medications  doses and developing a  long term treatment plan and documenting her care.   Please refer to Patient Instructions for Blood Glucose Monitoring and Insulin/Medications Dosing Guide"  in media tab for additional information. Please  also refer to " Patient Self Inventory" in the Media  tab for reviewed elements of pertinent patient history.  Kim [name] participated in the discussions, expressed understanding, and voiced agreement with the above plans.  All questions were answered to her satisfaction. she is encouraged to contact clinic should she have any questions or concerns prior to her return visit.   Follow up plan: - No follow-ups on file.  Glade Lloyd, MD [facility] [address] [place], [place] [address] Phone: [phone]  Fax: [phone]    09/01/2019, 12:41 PM  This note was partially dictated with voice recognition software. Similar sounding words can be transcribed inadequately or may not  be corrected upon review.

## 2019-09-10 ENCOUNTER — Other Ambulatory Visit: Payer: Self-pay | Admitting: "Endocrinology

## 2019-09-22 ENCOUNTER — Telehealth: Payer: Self-pay | Admitting: *Deleted

## 2019-09-22 NOTE — Telephone Encounter (Signed)
Patient would like a letter for the post office to have mail put in box beside front door from Dr. Jerelene Redden stating that the patient is in a wheelchair and it is unsafe and a hindrance for her to go to her mailbox in her wheelchair. Post office states they need a letter from patients PCP

## 2019-09-25 ENCOUNTER — Encounter: Payer: Self-pay | Admitting: *Deleted

## 2019-09-25 ENCOUNTER — Other Ambulatory Visit: Payer: Self-pay | Admitting: "Endocrinology

## 2019-09-25 NOTE — Telephone Encounter (Signed)
Pt notified that letter is ready and I will mail it to her today

## 2019-12-01 ENCOUNTER — Other Ambulatory Visit: Payer: Self-pay

## 2019-12-01 DIAGNOSIS — E1165 Type 2 diabetes mellitus with hyperglycemia: Secondary | ICD-10-CM

## 2019-12-01 DIAGNOSIS — E559 Vitamin D deficiency, unspecified: Secondary | ICD-10-CM

## 2019-12-01 DIAGNOSIS — I10 Essential (primary) hypertension: Secondary | ICD-10-CM

## 2019-12-01 DIAGNOSIS — E782 Mixed hyperlipidemia: Secondary | ICD-10-CM

## 2019-12-02 ENCOUNTER — Ambulatory Visit: Payer: Medicare Other | Admitting: "Endocrinology

## 2019-12-03 LAB — T4, FREE: Free T4: 1.31 ng/dL (ref 0.82–1.77)

## 2019-12-03 LAB — COMPREHENSIVE METABOLIC PANEL
ALT: 19 IU/L (ref 0–32)
AST: 18 IU/L (ref 0–40)
Albumin/Globulin Ratio: 1.8 (ref 1.2–2.2)
Albumin: 4.4 g/dL (ref 3.8–4.8)
Alkaline Phosphatase: 133 IU/L — ABNORMAL HIGH (ref 44–121)
BUN/Creatinine Ratio: 21 (ref 12–28)
BUN: 21 mg/dL (ref 8–27)
Bilirubin Total: 0.3 mg/dL (ref 0.0–1.2)
CO2: 25 mmol/L (ref 20–29)
Calcium: 9.3 mg/dL (ref 8.7–10.3)
Chloride: 100 mmol/L (ref 96–106)
Creatinine, Ser: 1.01 mg/dL — ABNORMAL HIGH (ref 0.57–1.00)
GFR calc Af Amer: 68 mL/min/{1.73_m2} (ref >59)
GFR calc non Af Amer: 59 mL/min/{1.73_m2} — ABNORMAL LOW (ref >59)
Globulin, Total: 2.5 g/dL (ref 1.5–4.5)
Glucose: 159 mg/dL — ABNORMAL HIGH (ref 65–99)
Potassium: 4.5 mmol/L (ref 3.5–5.2)
Sodium: 143 mmol/L (ref 134–144)
Total Protein: 6.9 g/dL (ref 6.0–8.5)

## 2019-12-03 LAB — LIPID PANEL
Chol/HDL Ratio: 3.8 ratio (ref 0.0–4.4)
Cholesterol, Total: 150 mg/dL (ref 100–199)
HDL: 40 mg/dL (ref >39)
LDL Chol Calc (NIH): 93 mg/dL (ref 0–99)
Triglycerides: 91 mg/dL (ref 0–149)
VLDL Cholesterol Cal: 17 mg/dL (ref 5–40)

## 2019-12-03 LAB — TSH: TSH: 3.33 u[IU]/mL (ref 0.450–4.500)

## 2019-12-17 ENCOUNTER — Encounter: Payer: Self-pay | Admitting: Family Medicine

## 2019-12-17 ENCOUNTER — Telehealth (INDEPENDENT_AMBULATORY_CARE_PROVIDER_SITE_OTHER): Payer: Medicare Other | Admitting: Family Medicine

## 2019-12-17 DIAGNOSIS — I10 Essential (primary) hypertension: Secondary | ICD-10-CM

## 2019-12-17 DIAGNOSIS — E785 Hyperlipidemia, unspecified: Secondary | ICD-10-CM

## 2019-12-17 DIAGNOSIS — E1165 Type 2 diabetes mellitus with hyperglycemia: Secondary | ICD-10-CM

## 2019-12-17 DIAGNOSIS — E1169 Type 2 diabetes mellitus with other specified complication: Secondary | ICD-10-CM | POA: Diagnosis not present

## 2019-12-17 DIAGNOSIS — E782 Mixed hyperlipidemia: Secondary | ICD-10-CM

## 2019-12-17 MED ORDER — ATORVASTATIN CALCIUM 10 MG PO TABS: 10.0000 mg | ORAL_TABLET | Freq: Every day | ORAL | 1 refills | Status: DC

## 2019-12-17 MED ORDER — LOSARTAN POTASSIUM-HCTZ 100-25 MG PO TABS: 1.0000 | ORAL_TABLET | Freq: Every day | ORAL | 1 refills | Status: DC

## 2019-12-17 NOTE — Progress Notes (Signed)
Virtual Visit via Telephone Note   This visit type was conducted due to national recommendations for restrictions regarding the COVID-19 Pandemic (e.g. social distancing) in an effort to limit this patient's exposure and mitigate transmission in our community.  Due to her co-morbid illnesses, this patient is at least at moderate risk for complications without adequate follow up.  This format is felt to be most appropriate for this patient at this time.  The patient did not have access to video technology/had technical difficulties with video requiring transitioning to audio format only (telephone).  All issues noted in this document were discussed and addressed.  No physical exam could be performed with this format.    Evaluation Performed:  Follow-up visit  Date:  12/17/2019   ID:  Kim [name], DOB Feb 08, 1956, MRN [id number]  Patient Location: Home Provider Location: Other:  offsite  Location of Patient: Home Location of Provider: Telehealth Consent was obtain for visit to be over via telehealth. I verified that I am speaking with the correct person using two identifiers.  PCP:  [name], NP   Chief Complaint:  Chronic conditions   History of Present Illness:    Kim [name] is a 64 y.o. female with history as detailed below.  Overall she reports that she is doing very well.  She has lost several family members 1 to Covid.  Her mother to old age as per her.  And another they are unsure of.  Other than that she has had a very good summer in the last 6 months.  She reports needing refills on her blood pressure and cholesterol medication.  She is recently had lab work and is can be following up with Dr. Dorris Fetch here soon.  She denies having any other issues or concerns.  She reports that she is sleeping well.  She has not had any changes in bowel or bladder habits.  She has not had any changes in her appetite.  She has not had any memory issues.  She has not had any changes in her  memory, hearing or vision.  She denies having any chest pain, palpitations, leg swelling, cough, shortness of breath or any other signs or symptoms of uncontrolled blood pressure and or illness.  The patient does not have symptoms concerning for COVID-19 infection (fever, chills, cough, or new shortness of breath).   Past Medical, Surgical, Social History, Allergies, and Medications have been Reviewed.  Past Medical History:  Diagnosis Date  . Cancer [facility])    history of leukemia  . Community acquired pneumonia 05/26/2013  . Diabetes mellitus without complication ([place])   . Encounter for screening mammogram for malignant neoplasm of breast 03/26/2019  . Hypercholesteremia   . Hypertension   . Neuropathy   . Seasonal allergies    Past Surgical History:  Procedure Laterality Date  . COLONOSCOPY  12/28/2011   Procedure: COLONOSCOPY;  Surgeon: Danie Binder, MD;  Location[facility];  Service: Endoscopy;  Laterality: N/A;  9:30 AM-changed to 8:30 [name] to notify pt  . LAPAROSCOPY    . TUBAL LIGATION    . TUNNELED VENOUS CATHETER PLACEMENT       Current Meds  Medication Sig  . acetaminophen (TYLENOL) 500 MG tablet Take 500 mg by mouth every 6 (six) hours as needed. Pain.  [name] atorvastatin (LIPITOR) 10 MG tablet Take 1 tablet (10 mg total) by mouth daily at 6 PM.  . Blood Glucose Monitoring Suppl (ACCU-CHEK GUIDE) w/Device  KIT 1 Piece by Does not apply route as directed.  [name] glipiZIDE (GLUCOTROL XL) 5 MG 24 hr tablet TAKE ONE TABLET BY MOUTH DAILY WITH BREAKFAST.  [name] glucose blood (GE100 BLOOD GLUCOSE TEST) test strip Use as directed to test twice daily  . Lancets MISC 1 each by Does not apply route 4 (four) times daily. Use as directed to check blood glucose four times daily  . losartan-hydrochlorothiazide (HYZAAR) 100-25 MG tablet Take 1 tablet by mouth daily.  . metFORMIN (GLUCOPHAGE) 1000 MG tablet Take 1 tablet (1,000 mg total) by mouth 2 (two) times daily with a meal.  . omeprazole  (PRILOSEC) 20 MG capsule Take 20 mg by mouth daily as needed. Heartburn  . silver sulfADIAZINE (SILVADENE) 1 % cream Apply 1 application topically 2 (two) times daily.  . [DISCONTINUED] atorvastatin (LIPITOR) 10 MG tablet Take 1 tablet (10 mg total) by mouth daily at 6 PM.  . [DISCONTINUED] losartan-hydrochlorothiazide (HYZAAR) 100-25 MG tablet Take 1 tablet by mouth daily.     Allergies:   Patient has no known allergies.   ROS:   Please see the history of present illness.    All other systems reviewed and are negative.   Labs/Other Tests and Data Reviewed:    Recent Labs: 03/26/2019: Hemoglobin 15.9; Platelets 374 12/02/2019: ALT 19; BUN 21; Creatinine, Ser 1.01; Potassium 4.5; Sodium 143; TSH 3.330   Recent Lipid Panel Lab Results  Component Value Date/Time   CHOL 150 12/02/2019 12:03 PM   TRIG 91 12/02/2019 12:03 PM   HDL 40 12/02/2019 12:03 PM   CHOLHDL 3.8 12/02/2019 12:03 PM   CHOLHDL 4.3 03/26/2019 03:17 PM   LDLCALC 93 12/02/2019 12:03 PM   LDLCALC 122 (H) 03/26/2019 03:17 PM    Wt Readings from Last 3 Encounters:  09/01/19 170 lb (77.1 kg)  06/23/19 174 lb 12.8 oz (79.3 kg)  06/23/19 175 lb (79.4 kg)     Objective:    Vital Signs:  There were no vitals taken for this visit.   VITAL SIGNS:  reviewed GEN:  no acute distress RESPIRATORY:  No shortness of breath noted in conversation PSYCH:  normal affect  ASSESSMENT & PLAN:   1. Essential hypertension, benign  - losartan-hydrochlorothiazide (HYZAAR) 100-25 MG tablet; Take 1 tablet by mouth daily.  Dispense: 90 tablet; Refill: 1  2. Uncontrolled type 2 diabetes mellitus with hyperglycemia ([place])   3. Mixed hyperlipidemia   4. Hyperlipidemia associated with type 2 diabetes mellitus ([facility])  - atorvastatin (LIPITOR) 10 MG tablet; Take 1 tablet (10 mg total) by mouth daily at 6 PM.  Dispense: 90 tablet; Refill: 1   Time:   Today, I have spent 6 minutes with the patient with telehealth technology discussing  the above problems.     Medication Adjustments/Labs and Tests Ordered: Current medicines are reviewed at length with the patient today.  Concerns regarding medicines are outlined above.   Tests Ordered: No orders of the defined types were placed in this encounter.   Medication Changes: Meds ordered this encounter  Medications  . atorvastatin (LIPITOR) 10 MG tablet    Sig: Take 1 tablet (10 mg total) by mouth daily at 6 PM.    Dispense:  90 tablet    Refill:  1    Order Specific Question:   Supervising Provider    Answer:   SIMPSON, MARGARET E [[id number]]  . losartan-hydrochlorothiazide (HYZAAR) 100-25 MG tablet    Sig: Take 1 tablet by mouth daily.    Dispense:  90 tablet    Refill:  1    Order Specific Question:   Supervising Provider    Answer:   Fayrene Helper [[id number]]     Note: This dictation was prepared with Dragon dictation along with smaller phrase technology. Similar sounding words can be transcribed inadequately or may not be corrected upon review. Any transcriptional errors that result from this process are unintentional.      Disposition:  Follow up 6 months for CPE Signed, [name], NP  12/17/2019 11:36 AM     [place]

## 2019-12-17 NOTE — Patient Instructions (Addendum)
  HAPPY FALL!  I appreciate the opportunity to provide you with care for your health and wellness. Today we discussed: overall health  Follow up: As per last CPE fasting appt for labs same day  No labs or referrals today  Have a great Holiday Season!  Please continue to practice social distancing to keep you, your family, and our community safe.  If you must go out, please wear a mask and practice good handwashing.  It was a pleasure to see you and I look forward to continuing to work together on your health and well-being. Please do not hesitate to call the office if you need care or have questions about your care.  Have a wonderful day and week. With Gratitude, Cherly Beach, DNP, AGNP-BC

## 2019-12-17 NOTE — Assessment & Plan Note (Signed)
Refill provided, heart healthy DM friendly diet is encouraged

## 2019-12-17 NOTE — Assessment & Plan Note (Signed)
Followed by Dr Dorris Fetch Encouraged to continue medications as per him

## 2019-12-17 NOTE — Assessment & Plan Note (Signed)
Kim [name] is encouraged to maintain a well balanced diet that is low in salt. Controlled, continue current medication regimen. Refills provided.  Additionally, she is also reminded that exercise is beneficial for heart health and control of  Blood pressure. 30-60 minutes daily is recommended-walking was suggested.

## 2019-12-31 ENCOUNTER — Other Ambulatory Visit: Payer: Self-pay | Admitting: "Endocrinology

## 2020-01-15 ENCOUNTER — Telehealth: Payer: Self-pay | Admitting: Radiology

## 2020-01-15 NOTE — Telephone Encounter (Signed)
error 

## 2020-02-23 ENCOUNTER — Other Ambulatory Visit: Payer: Self-pay | Admitting: "Endocrinology

## 2020-02-24 ENCOUNTER — Telehealth: Payer: Self-pay

## 2020-02-24 NOTE — Telephone Encounter (Signed)
Patient left a VM that she went to the pharmacy and her medication was denied. Patient needs labs and appt

## 2020-03-01 NOTE — Telephone Encounter (Signed)
Pt is requesting an appt. She did labs 10/20. Can we use those or do you want repeat labs? Please advise

## 2020-03-01 NOTE — Telephone Encounter (Signed)
We can use her oct labs, a1c in office

## 2020-03-08 ENCOUNTER — Other Ambulatory Visit: Payer: Self-pay

## 2020-03-08 ENCOUNTER — Encounter: Payer: Self-pay | Admitting: "Endocrinology

## 2020-03-08 ENCOUNTER — Ambulatory Visit (INDEPENDENT_AMBULATORY_CARE_PROVIDER_SITE_OTHER): Payer: Medicare Other | Admitting: "Endocrinology

## 2020-03-08 VITALS — BP 110/56 | HR 88 | Ht 66.0 in | Wt 176.8 lb

## 2020-03-08 DIAGNOSIS — E1165 Type 2 diabetes mellitus with hyperglycemia: Secondary | ICD-10-CM | POA: Diagnosis not present

## 2020-03-08 DIAGNOSIS — I10 Essential (primary) hypertension: Secondary | ICD-10-CM

## 2020-03-08 DIAGNOSIS — E785 Hyperlipidemia, unspecified: Secondary | ICD-10-CM | POA: Diagnosis not present

## 2020-03-08 DIAGNOSIS — E1169 Type 2 diabetes mellitus with other specified complication: Secondary | ICD-10-CM

## 2020-03-08 DIAGNOSIS — E782 Mixed hyperlipidemia: Secondary | ICD-10-CM

## 2020-03-08 DIAGNOSIS — E559 Vitamin D deficiency, unspecified: Secondary | ICD-10-CM

## 2020-03-08 LAB — POCT GLYCOSYLATED HEMOGLOBIN (HGB A1C): HbA1c, POC (controlled diabetic range): 7.2 % — AB (ref 0.0–7.0)

## 2020-03-08 MED ORDER — GLIPIZIDE ER 5 MG PO TB24: 5.0000 mg | ORAL_TABLET | Freq: Every day | ORAL | 1 refills | Status: DC

## 2020-03-08 MED ORDER — ATORVASTATIN CALCIUM 10 MG PO TABS: 10.0000 mg | ORAL_TABLET | Freq: Every day | ORAL | 1 refills | Status: DC

## 2020-03-08 NOTE — Progress Notes (Signed)
03/08/2020, 2:35 PM   Endocrinology follow-up note  Subjective:    Patient ID: Kim [name], female    DOB: [birth year].  Kim [name] is being seen in follow-up after she was seen in consultation for management of currently uncontrolled symptomatic diabetes requested by  [name], NP.   Past Medical History:  Diagnosis Date  . Cancer [facility])    history of leukemia  . Community acquired pneumonia 05/26/2013  . Diabetes mellitus without complication ([place])   . Encounter for screening mammogram for malignant neoplasm of breast 03/26/2019  . Hypercholesteremia   . Hypertension   . Neuropathy   . Seasonal allergies     Past Surgical History:  Procedure Laterality Date  . COLONOSCOPY  12/28/2011   Procedure: COLONOSCOPY;  Surgeon: Danie Binder, MD;  Location: [facility];  Service: Endoscopy;  Laterality: N/A;  9:30 AM-changed to 8:30 [name] to notify pt  . LAPAROSCOPY    . TUBAL LIGATION    . TUNNELED VENOUS CATHETER PLACEMENT      Social History   Socioeconomic History  . Marital status: Divorced    Spouse name: Not on file  . Number of children: 2  . Years of education: Not on file  . Highest education level: 11th grade  Occupational History  . Not on file  Tobacco Use  . Smoking status: Current Every Day Smoker    Packs/day: 0.50    Years: 40.00    Pack years: 20.00  . Smokeless tobacco: Never Used  Substance and Sexual Activity  . Alcohol use: No  . Drug use: No  . Sexual activity: Not Currently  Other Topics Concern  . 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    2 grown children   Son [name] in [place]    Daughter [name] in [place] with daughter [name]       Enjoys: shopping, play games       Diet: not as good a she would like, eats all food groups   Caffeine: 2 cups of coffee in the mornings,  coke daily   Water: 3-4 cups daily       Wear seat belt    Smoke detectors at home   Does not use phone while driving      Social Determinants of Radio broadcast assistant Strain: Not on file  Food Insecurity: Not on file  Transportation Needs: Not on file  Physical Activity: Not on file  Stress: Not on file  Social Connections: Not on file    Family History  Problem Relation Age of Onset  . Hypothyroidism Mother   . Diabetes Mother   . Hypertension Mother   . Thyroid disease Mother   . Hypertension Father   . Stroke Father   . Colon cancer Neg Hx     Outpatient Encounter Medications as of 03/08/2020  Medication Sig  . acetaminophen (TYLENOL) 500 MG tablet Take 500 mg by mouth every 6 (six) hours as needed. Pain.  [name] atorvastatin (LIPITOR) 10  MG tablet Take 1 tablet (10 mg total) by mouth daily at 6 PM.  . Blood Glucose Monitoring Suppl (ACCU-CHEK GUIDE) w/Device KIT 1 Piece by Does not apply route as directed.  [name] glipiZIDE (GLUCOTROL XL) 5 MG 24 hr tablet Take 1 tablet (5 mg total) by mouth daily with breakfast.  . glucose blood (GE100 BLOOD GLUCOSE TEST) test strip Use as directed to test twice daily  . Lancets MISC 1 each by Does not apply route 4 (four) times daily. Use as directed to check blood glucose four times daily  . losartan-hydrochlorothiazide (HYZAAR) 100-25 MG tablet Take 1 tablet by mouth daily.  . metFORMIN (GLUCOPHAGE) 1000 MG tablet Take 1 tablet (1,000 mg total) by mouth 2 (two) times daily with a meal.  . omeprazole (PRILOSEC) 20 MG capsule Take 20 mg by mouth daily as needed. Heartburn  . silver sulfADIAZINE (SILVADENE) 1 % cream Apply 1 application topically 2 (two) times daily.  . [DISCONTINUED] atorvastatin (LIPITOR) 10 MG tablet Take 1 tablet (10 mg total) by mouth daily at 6 PM. (Patient not taking: Reported on 03/08/2020)  . [DISCONTINUED] glipiZIDE (GLUCOTROL XL) 5 MG 24 hr tablet TAKE ONE TABLET BY MOUTH DAILY WITH BREAKFAST.  . [DISCONTINUED]  glipiZIDE (GLUCOTROL XL) 5 MG 24 hr tablet Take 1 tablet (5 mg total) by mouth daily with breakfast.   No facility-administered encounter medications on file as of 03/08/2020.    ALLERGIES: No Known Allergies  VACCINATION STATUS: Immunization History  Administered Date(s) Administered  . Pneumococcal Polysaccharide-23 05/27/2013, 06/23/2019    Diabetes She presents for her follow-up diabetic visit. She has type 2 diabetes mellitus. Onset time: She was diagnosed at approximate age of 77 years. Her disease course has been stable. There are no hypoglycemic associated symptoms. Pertinent negatives for hypoglycemia include no confusion, headaches, pallor or seizures. Associated symptoms include blurred vision and fatigue. Pertinent negatives for diabetes include no chest pain, no polydipsia, no polyphagia and no polyuria. There are no hypoglycemic complications. Symptoms are stable. There are no diabetic complications. Risk factors for coronary artery disease include diabetes mellitus, dyslipidemia, hypertension, family history, tobacco exposure, sedentary lifestyle and post-menopausal. Current diabetic treatment includes oral agent (monotherapy). Her weight is fluctuating minimally. She is following a generally unhealthy diet. When asked about meal planning, she reported none. She has not had a previous visit with a dietitian. She rarely (She is wheelchair-bound due to deconditioning/disequilibrium.  ) participates in exercise. Her home blood glucose trend is fluctuating minimally. Her breakfast blood glucose range is generally 130-140 mg/dl. Her bedtime blood glucose range is generally 140-180 mg/dl. Her overall blood glucose range is 140-180 mg/dl. (She presents with continued control of glycemia both fasting and postprandial.  Her point-of-care A1c is near target at 7.2%, overall improving from 11.5%.   ) An ACE inhibitor/angiotensin II receptor blocker is being taken. Eye exam is current.   Hyperlipidemia This is a chronic problem. The current episode started more than 1 year ago. Exacerbating diseases include diabetes. Pertinent negatives include no chest pain, myalgias or shortness of breath. Current antihyperlipidemic treatment includes statins. Risk factors for coronary artery disease include diabetes mellitus, hypertension, dyslipidemia, family history, post-menopausal and a sedentary lifestyle.  Hypertension This is a chronic problem. The current episode started more than 1 year ago. Associated symptoms include blurred vision. Pertinent negatives include no chest pain, headaches, palpitations or shortness of breath. Risk factors for coronary artery disease include diabetes mellitus, dyslipidemia, family history, post-menopausal state, smoking/tobacco exposure and sedentary lifestyle.  Review of Systems  Constitutional: Positive for fatigue. Negative for chills, fever and unexpected weight change.  HENT: Negative for trouble swallowing and voice change.   Eyes: Positive for blurred vision. Negative for visual disturbance.  Respiratory: Negative for cough, shortness of breath and wheezing.   Cardiovascular: Negative for chest pain, palpitations and leg swelling.  Gastrointestinal: Negative for diarrhea, nausea and vomiting.  Endocrine: Negative for cold intolerance, heat intolerance, polydipsia, polyphagia and polyuria.  Musculoskeletal: Positive for gait problem. Negative for arthralgias and myalgias.       She is wheelchair-bound due to deconditioning/disequilibrium.  Skin: Negative for color change, pallor, rash and wound.  Neurological: Negative for seizures and headaches.  Psychiatric/Behavioral: Negative for confusion and suicidal ideas.    Objective:    Vitals with BMI 03/08/2020 09/01/2019 06/23/2019  Height 5' 6"  5' 6"  5' 6"   Weight 176 lbs 13 oz 170 lbs 174 lbs 13 oz  BMI 28.55 73.71 06.26  Systolic 948 546 270  Diastolic 56 70 82  Pulse 88 96 109    BP  (!) 110/56   Pulse 88   Ht 5' 6"  (1.676 m)   Wt 176 lb 12.8 oz (80.2 kg)   BMI 28.54 kg/m   Wt Readings from Last 3 Encounters:  03/08/20 176 lb 12.8 oz (80.2 kg)  09/01/19 170 lb (77.1 kg)  06/23/19 174 lb 12.8 oz (79.3 kg)     Physical Exam Constitutional:      Appearance: She is well-developed.  HENT:     Head: Normocephalic and atraumatic.  Neck:     Thyroid: No thyromegaly.     Trachea: No tracheal deviation.  Cardiovascular:     Rate and Rhythm: Normal rate and regular rhythm.  Pulmonary:     Effort: Pulmonary effort is normal.     Breath sounds: Normal breath sounds.  Abdominal:     Tenderness: There is no abdominal tenderness. There is no guarding.  Musculoskeletal:     Cervical back: Normal range of motion and neck supple.     Comments: She is wheelchair-bound.  Skin:    General: Skin is warm and dry.     Coloration: Skin is not pale.     Findings: No erythema or rash.  Neurological:     Mental Status: She is alert and oriented to person, place, and time.     Cranial Nerves: No cranial nerve deficit.     Coordination: Coordination normal.     Deep Tendon Reflexes: Reflexes are normal and symmetric.  Psychiatric:        Judgment: Judgment normal.    CMP ( most recent) CMP     Component Value Date/Time   NA 143 12/02/2019 1203   K 4.5 12/02/2019 1203   CL 100 12/02/2019 1203   CO2 25 12/02/2019 1203   GLUCOSE 159 (H) 12/02/2019 1203   GLUCOSE 250 (H) 03/26/2019 1517   BUN 21 12/02/2019 1203   CREATININE 1.01 (H) 12/02/2019 1203   CREATININE 0.93 03/26/2019 1517   CALCIUM 9.3 12/02/2019 1203   PROT 6.9 12/02/2019 1203   ALBUMIN 4.4 12/02/2019 1203   AST 18 12/02/2019 1203   ALT 19 12/02/2019 1203   ALKPHOS 133 (H) 12/02/2019 1203   BILITOT 0.3 12/02/2019 1203   GFRNONAA 59 (L) 12/02/2019 1203   GFRNONAA 65 03/26/2019 1517   GFRAA 68 12/02/2019 1203   GFRAA 75 03/26/2019 1517    Diabetic Labs (most recent): Lab Results  Component Value Date  HGBA1C 7.2 (A) 03/08/2020   HGBA1C 6.9 (A) 09/01/2019   HGBA1C 11.5 (H) 03/26/2019    Lipid Panel     Component Value Date/Time   CHOL 150 12/02/2019 1203   TRIG 91 12/02/2019 1203   HDL 40 12/02/2019 1203   CHOLHDL 3.8 12/02/2019 1203   CHOLHDL 4.3 03/26/2019 1517   LDLCALC 93 12/02/2019 1203   LDLCALC 122 (H) 03/26/2019 1517   LABVLDL 17 12/02/2019 1203     Assessment & Plan:   1. Uncontrolled type 2 diabetes mellitus with hyperglycemia ([place])  - Kim [name] has currently uncontrolled symptomatic type 2 DM since  65 years of age.   She presents her log which shows significantly improved glycemic profile to near target levels.   She presents with continued control of glycemia both fasting and postprandial.  Her point-of-care A1c is near target at 7.2%, overall improving from 11.5%.    Recent labs reviewed. - I had a long discussion with her about the progressive nature of diabetes and the pathology behind its complications. -her diabetes is complicated by sedentary life, smoking and she remains at a high risk for more acute and chronic complications which include CAD, CVA, CKD, retinopathy, and neuropathy. These are all discussed in detail with her.  - I have counseled her on diet  and weight management  by adopting a carbohydrate restricted/protein rich diet. Patient is encouraged to switch to  unprocessed or minimally processed     complex starch and increased protein intake (animal or plant source), fruits, and vegetables. -  she is advised to stick to a routine mealtimes to eat 3 meals  a day and avoid unnecessary snacks ( to snack only to correct hypoglycemia).   - she acknowledges that there is a room for improvement in her food and drink choices. - Suggestion is made for her to avoid simple carbohydrates  from her diet including Cakes, Sweet Desserts, Ice Cream, Soda (diet and regular), Sweet Tea, Candies, Chips, Cookies, Store Bought Juices, Alcohol in Excess of  1-2  drinks a day, Artificial Sweeteners,  Coffee Creamer, and "Sugar-free" Products, Lemonade. This will help patient to have more stable blood glucose profile and potentially avoid unintended weight gain.   - I have approached her with the following individualized plan to manage  her diabetes and patient agrees:   -Based on her presentation with near target glycemic profile, she will not need insulin treatment for now.  She wishes to avoid insulin treatment for now.    She is willing to continue to monitor blood glucose twice a day-daily before breakfast and at bedtime.   - she is encouraged to call clinic for blood glucose levels less than 70 or greater than 200 mg/Dl at fasting.  - she is advised to continue Metformin 1000 mg p.o. twice daily -daily after breakfast and after supper, and glipizide 5 mg XL p.o. daily at breakfast.    - she is not a candidate for SGLT2 inhibitors nor incretin therapy.   - Specific targets for  A1c;  LDL, HDL,  and Triglycerides were discussed with the patient.  2) Blood Pressure /Hypertension: Her blood pressure is controlled to target. she is advised to continue her current medications including losartan/HCTZ 100/25 mg p.o. daily with breakfast .  3) Lipids/Hyperlipidemia:   Review of her recent lipid panel showed improved LDL to 93 from 122.  However, she inadvertently stopped her atorvastatin.  I discussed and prescribed atorvastatin 10 mg p.o. daily at bedtime.  Side effects and precautions discussed with her.  4)  Weight/Diet:  Body mass index is 28.54 kg/m.  -She is not a candidate for major weight loss.   I discussed with her the fact that loss of 5 - 10% of her  current body weight will have the most impact on her diabetes management.  Exercise, and detailed carbohydrates information provided  -  detailed on discharge instructions.  5) Chronic Care/Health Maintenance:  -she  is on ACEI/ARB and Statin medications and  is encouraged to initiate and  continue to follow up with Ophthalmology, Dentist,  Podiatrist at least yearly or according to recommendations, and advised to  Quit smoking. I have recommended yearly flu vaccine and pneumonia vaccine at least every 5 years;  and  sleep for at least 7 hours a day.  She is wheelchair-bound, cannot exercise optimally.  - she is  advised to maintain close follow up with [name], NP for primary care needs, as well as her other providers for optimal and coordinated care.   - Time spent on this patient care encounter:  35 min, of which > 50% was spent in  counseling and the rest reviewing her blood glucose logs , discussing her hypoglycemia and hyperglycemia episodes, reviewing her current and  previous labs / studies  ( including abstraction from other facilities) and medications  doses and developing a  long term treatment plan and documenting her care.   Please refer to Patient Instructions for Blood Glucose Monitoring and Insulin/Medications Dosing Guide"  in media tab for additional information. Please  also refer to " Patient Self Inventory" in the Media  tab for reviewed elements of pertinent patient history.  [name] participated in the discussions, expressed understanding, and voiced agreement with the above plans.  All questions were answered to her satisfaction. she is encouraged to contact clinic should she have any questions or concerns prior to her return visit.   Follow up plan: - Return in about 4 months (around 07/06/2020) for F/U with Pre-visit Labs, Meter, Logs, A1c here.Glade Lloyd, MD [facility] [address] [place], [place] [address] Phone: [phone]  Fax: [phone]    03/08/2020, 2:35 PM  This note was partially dictated with voice recognition software. Similar sounding words can be transcribed inadequately or may not  be corrected upon review.

## 2020-03-08 NOTE — Patient Instructions (Signed)
                                     Advice for Weight Management  -For most of us the best way to lose weight is by diet management. Generally speaking, diet management means consuming less calories intentionally which over time brings about progressive weight loss.  This can be achieved more effectively by restricting carbohydrate consumption to the minimum possible.  So, it is critically important to know your numbers: how much calorie you are consuming and how much calorie you need. More importantly, our carbohydrates sources should be unprocessed or minimally processed complex starch food items.   Sometimes, it is important to balance nutrition by increasing protein intake (animal or plant source), fruits, and vegetables.  -Sticking to a routine mealtime to eat 3 meals a day and avoiding unnecessary snacks is shown to have a big role in weight control. Under normal circumstances, the only time we lose real weight is when we are hungry, so allow hunger to take place- hunger means no food between meal times, only water.  It is not advisable to starve.   -It is better to avoid simple carbohydrates including: Cakes, Sweet Desserts, Ice Cream, Soda (diet and regular), Sweet Tea, Candies, Chips, Cookies, Store Bought Juices, Alcohol in Excess of  1-2 drinks a day, Lemonade,  Artificial Sweeteners, Doughnuts, Coffee Creamers, "Sugar-free" Products, etc, etc.  This is not a complete list.....    -Consulting with certified diabetes educators is proven to provide you with the most accurate and current information on diet.  Also, you may be  interested in discussing diet options/exchanges , we can schedule a visit with Kim [name], RDN, CDE for individualized nutrition education.  -Exercise: If you are able: 30 -60 minutes a day ,4 days a week, or 150 minutes a week.  The longer the better.  Combine stretch, strength, and aerobic activities.  If you were told in the  past that you have high risk for cardiovascular diseases, you may seek evaluation by your heart doctor prior to initiating moderate to intense exercise programs.                                  Additional Care Considerations for Diabetes   -Diabetes  is a chronic disease.  The most important care consideration is regular follow-up with your diabetes care provider with the goal being avoiding or delaying its complications and to take advantage of advances in medications and technology.    -Type 2 diabetes is known to coexist with other important comorbidities such as high blood pressure and high cholesterol.  It is critical to control not only the diabetes but also the high blood pressure and high cholesterol to minimize and delay the risk of complications including coronary artery disease, stroke, amputations, blindness, etc.    - Studies showed that people with diabetes will benefit from a class of medications known as ACE inhibitors and statins.  Unless there are specific reasons not to be on these medications, the standard of care is to consider getting one from these groups of medications at an optimal doses.  These medications are generally considered safe and proven to help protect the heart and the kidneys.    - People with diabetes are encouraged to initiate and maintain regular follow-up with eye doctors, foot   doctors, dentists , and if necessary heart and kidney doctors.     - It is highly recommended that people with diabetes quit smoking or stay away from smoking, and get yearly  flu vaccine and pneumonia vaccine at least every 5 years.  One other important lifestyle recommendation is to ensure adequate sleep - at least 6-7 hours of uninterrupted sleep at night.  -Exercise: If you are able: 30 -60 minutes a day, 4 days a week, or 150 minutes a week.  The longer the better.  Combine stretch, strength, and aerobic activities.  If you were told in the past that you have high risk for  cardiovascular diseases, you may seek evaluation by your heart doctor prior to initiating moderate to intense exercise programs.          

## 2020-03-09 ENCOUNTER — Telehealth: Payer: Self-pay

## 2020-03-09 ENCOUNTER — Other Ambulatory Visit: Payer: Self-pay

## 2020-03-09 DIAGNOSIS — E785 Hyperlipidemia, unspecified: Secondary | ICD-10-CM

## 2020-03-09 DIAGNOSIS — E1169 Type 2 diabetes mellitus with other specified complication: Secondary | ICD-10-CM

## 2020-03-09 DIAGNOSIS — E1165 Type 2 diabetes mellitus with hyperglycemia: Secondary | ICD-10-CM

## 2020-03-09 MED ORDER — ATORVASTATIN CALCIUM 10 MG PO TABS: 10.0000 mg | ORAL_TABLET | Freq: Every day | ORAL | 1 refills | Status: DC

## 2020-03-09 MED ORDER — GLIPIZIDE ER 5 MG PO TB24: 5.0000 mg | ORAL_TABLET | Freq: Every day | ORAL | 1 refills | Status: DC

## 2020-03-09 NOTE — Telephone Encounter (Signed)
Pt states her glipizide or Lipitor was not called in yesterday at her appt. Can you re send please?

## 2020-03-09 NOTE — Telephone Encounter (Signed)
Sent in

## 2020-04-26 DIAGNOSIS — E1165 Type 2 diabetes mellitus with hyperglycemia: Secondary | ICD-10-CM | POA: Diagnosis not present

## 2020-04-27 LAB — COMPREHENSIVE METABOLIC PANEL
ALT: 22 IU/L (ref 0–32)
AST: 21 IU/L (ref 0–40)
Albumin/Globulin Ratio: 1.7 (ref 1.2–2.2)
Albumin: 4.5 g/dL (ref 3.8–4.8)
Alkaline Phosphatase: 132 IU/L — ABNORMAL HIGH (ref 44–121)
BUN/Creatinine Ratio: 21 (ref 12–28)
BUN: 22 mg/dL (ref 8–27)
Bilirubin Total: <0.2 mg/dL (ref 0.0–1.2)
CO2: 24 mmol/L (ref 20–29)
Calcium: 9.6 mg/dL (ref 8.7–10.3)
Chloride: 98 mmol/L (ref 96–106)
Creatinine, Ser: 1.06 mg/dL — ABNORMAL HIGH (ref 0.57–1.00)
Globulin, Total: 2.7 g/dL (ref 1.5–4.5)
Glucose: 206 mg/dL — ABNORMAL HIGH (ref 65–99)
Potassium: 4.2 mmol/L (ref 3.5–5.2)
Sodium: 143 mmol/L (ref 134–144)
Total Protein: 7.2 g/dL (ref 6.0–8.5)
eGFR: 58 mL/min/{1.73_m2} — ABNORMAL LOW (ref >59)

## 2020-05-05 ENCOUNTER — Other Ambulatory Visit: Payer: Self-pay

## 2020-05-05 MED ORDER — OMEPRAZOLE 20 MG PO CPDR: 20.0000 mg | DELAYED_RELEASE_CAPSULE | Freq: Every day | ORAL | 3 refills | Status: DC | PRN

## 2020-05-31 NOTE — Progress Notes (Signed)
Subjective:   Kim [name] is a 65 y.o. female who presents for an Initial Medicare Annual Wellness Visit.  I connected with Lunette Stands  today by telephone and verified that I am speaking with the correct person using two identifiers. Location patient: home Location provider: work Persons participating in the virtual visit: patient, provider.   I discussed the limitations, risks, security and privacy concerns of performing an evaluation and management service by telephone and the availability of in person appointments. I also discussed with the patient that there may be a patient responsible charge related to this service. The patient expressed understanding and verbally consented to this telephonic visit.    Interactive audio and video telecommunications were attempted between this provider and patient, however failed, due to patient having technical difficulties OR patient did not have access to video capability.  We continued and completed visit with audio only.      Review of Systems    N/A  Cardiac Risk Factors include: diabetes mellitus;advanced age (>110mn, >>6women);hypertension     Objective:    Today's Vitals   There is no height or weight on file to calculate BMI.  Advanced Directives 06/01/2020 05/26/2013 12/28/2011  Does Patient Have a Medical Advance Directive? No Patient does not have advance directive Patient would like information;Patient does not have advance directive  Would patient like information on creating a medical advance directive? No - Patient declined - -  Pre-existing out of facility DNR order (yellow form or pink MOST form) - No No    Current Medications (verified) Outpatient Encounter Medications as of 06/01/2020  Medication Sig  . acetaminophen (TYLENOL) 500 MG tablet Take 500 mg by mouth every 6 (six) hours as needed. Pain.  .Marland Kitchenatorvastatin (LIPITOR) 10 MG tablet Take 1 tablet (10 mg total) by mouth daily at 6 PM.  . Blood Glucose  Monitoring Suppl (ACCU-CHEK GUIDE) w/Device KIT 1 Piece by Does not apply route as directed.  .Marland KitchenglipiZIDE (GLUCOTROL XL) 5 MG 24 hr tablet Take 1 tablet (5 mg total) by mouth daily with breakfast.  . glucose blood (GE100 BLOOD GLUCOSE TEST) test strip Use as directed to test twice daily  . Lancets MISC 1 each by Does not apply route 4 (four) times daily. Use as directed to check blood glucose four times daily  . losartan-hydrochlorothiazide (HYZAAR) 100-25 MG tablet Take 1 tablet by mouth daily.  . metFORMIN (GLUCOPHAGE) 1000 MG tablet Take 1 tablet (1,000 mg total) by mouth 2 (two) times daily with a meal.  . omeprazole (PRILOSEC) 20 MG capsule Take 1 capsule (20 mg total) by mouth daily as needed. Heartburn  . silver sulfADIAZINE (SILVADENE) 1 % cream Apply 1 application topically 2 (two) times daily.   No facility-administered encounter medications on file as of 06/01/2020.    Allergies (verified) Patient has no known allergies.   History: Past Medical History:  Diagnosis Date  . Cancer ([facility]    history of leukemia  . Community acquired pneumonia 05/26/2013  . Diabetes mellitus without complication ([facility]   . Encounter for screening mammogram for malignant neoplasm of breast 03/26/2019  . Hypercholesteremia   . Hypertension   . Neuropathy   . Seasonal allergies    Past Surgical History:  Procedure Laterality Date  . COLONOSCOPY  12/28/2011   Procedure: COLONOSCOPY;  Surgeon: SDanie Binder MD;  Location: [facility];  Service: Endoscopy;  Laterality: N/A;  9:30 AM-changed to 8:30 [name] to notify pt  . LAPAROSCOPY    .  TUBAL LIGATION    . TUNNELED VENOUS CATHETER PLACEMENT     Family History  Problem Relation Age of Onset  . Hypothyroidism Mother   . Diabetes Mother   . Hypertension Mother   . Thyroid disease Mother   . Hypertension Father   . Stroke Father   . Colon cancer Neg Hx    Social History   Socioeconomic History  . Marital status: Divorced    Spouse name:  Not on file  . Number of children: 2  . Years of education: Not on file  . Highest education level: 11th grade  Occupational History  . Not on file  Tobacco Use  . Smoking status: Current Every Day Smoker    Packs/day: 0.50    Years: 40.00    Pack years: 20.00  . Smokeless tobacco: Never Used  Substance and Sexual Activity  . Alcohol use: No  . Drug use: No  . Sexual activity: Not Currently  Other Topics Concern  . Not on file  Social History Narrative   Lives with 27 year old mother -caregiver    2 grown children   Son [name] in [place]    Daughter [name] in [place] with daughter [name]       Enjoys: shopping, play games       Diet: not as good a she would like, eats all food groups   Caffeine: 2 cups of coffee in the mornings, coke daily   Water: 3-4 cups daily       Wear seat belt    Smoke detectors at home   Does not use phone while driving      Social Determinants of Health   Financial Resource Strain: Low Risk   . Difficulty of Paying Living Expenses: Not hard at all  Food Insecurity: No Food Insecurity  . Worried About Charity fundraiser in the Last Year: Never true  . Ran Out of Food in the Last Year: Never true  Transportation Needs: No Transportation Needs  . Lack of Transportation (Medical): No  . Lack of Transportation (Non-Medical): No  Physical Activity: Inactive  . Days of Exercise per Week: 0 days  . Minutes of Exercise per Session: 0 min  Stress: No Stress Concern Present  . Feeling of Stress : Not at all  Social Connections: Socially Isolated  . Frequency of Communication with Friends and Family: More than three times a week  . Frequency of Social Gatherings with Friends and Family: More than three times a week  . Attends Religious Services: Never  . Active Member of Clubs or Organizations: No  . Attends Archivist Meetings: Never  . Marital Status: Divorced    Tobacco Counseling Ready to quit: Not  Answered Counseling given: Not Answered   Clinical Intake:  Pre-visit preparation completed: Yes  Pain : No/denies pain     Nutritional Risks: None Diabetes: Yes CBG done?: No Did pt. bring in CBG monitor from home?: No  How often do you need to have someone help you when you read instructions, pamphlets, or other written materials from your doctor or pharmacy?: 1 - Never What is the last grade level you completed in school?: 11th grade  Diabetic?Yes Nutrition Risk Assessment:  Has the patient had any N/V/D within the last 2 months?  No  Does the patient have any non-healing wounds?  No  Has the patient had any unintentional weight loss or weight gain?  No   Diabetes:  Is the patient diabetic?  Yes  If diabetic, was a CBG obtained today?  No  Did the patient bring in their glucometer from home?  No  How often do you monitor your CBG's? Patient states checks glucose twice per day.   Financial Strains and Diabetes Management:  Are you having any financial strains with the device, your supplies or your medication? No .  Does the patient want to be seen by Chronic Care Management for management of their diabetes?  No  Would the patient like to be referred to a Nutritionist or for Diabetic Management?  No   Diabetic Exams:  Diabetic Eye Exam: Overdue for diabetic eye exam. Pt has been advised about the importance in completing this exam. Patient advised to call and schedule an eye exam. Diabetic Foot Exam: Completed 06/23/2019   Interpreter Needed?: No  Information entered by :: [place] of Daily Living In your present state of health, do you have any difficulty performing the following activities: 06/01/2020 06/23/2019  Hearing? N N  Vision? N N  Difficulty concentrating or making decisions? N N  Walking or climbing stairs? Y Y  Dressing or bathing? N Y  Doing errands, shopping? Tempie Donning  Preparing Food and eating ? N -  Using the Toilet? N -  In the  past six months, have you accidently leaked urine? Y -  Comment has stress incontience. Leaks urine with coughing -  Do you have problems with loss of bowel control? N -  Managing your Medications? N -  Managing your Finances? N -  Housekeeping or managing your Housekeeping? N -  Some recent data might be hidden    Patient Care Team: [name], NP as PCP - General (Family Medicine)  Indicate any recent Medical Services you may have received from other than [facility] providers in the past year (date may be approximate).     Assessment:   This is a routine wellness examination for Cherrelle.  Hearing/Vision screen  Hearing Screening   125Hz  250Hz  500Hz  1000Hz  2000Hz  3000Hz  4000Hz  6000Hz  8000Hz   Right ear:           Left ear:           Vision Screening Comments: States has not had an eye exam in a few years. Patient states that she only wears reading glasses that she purchases over the counter   Dietary issues and exercise activities discussed: Current Exercise Habits: The patient does not participate in regular exercise at present  Goals    . Patient Stated     I would like to maintain my current state of health     . Prevent falls      Depression Screen PHQ 2/9 Scores 06/01/2020 12/17/2019 03/26/2019  PHQ - 2 Score 0 1 2  PHQ- 9 Score - - 6    Fall Risk Fall Risk  06/01/2020 12/17/2019 06/23/2019 03/26/2019  Falls in the past year? 0 0 0 1  Number falls in past yr: 0 0 0 1  Injury with Fall? 0 0 0 1  Risk for fall due to : Impaired balance/gait;Impaired mobility No Fall Risks Impaired balance/gait;Impaired mobility -  Follow up Falls evaluation completed;Falls prevention discussed Falls evaluation completed - -    FALL RISK PREVENTION PERTAINING TO THE HOME:  Any stairs in or around the home? No  If so, are there any without handrails? No  Home free of loose throw rugs in walkways, pet beds, electrical cords,  etc? Yes  Adequate lighting in your home to reduce risk of  falls? Yes   ASSISTIVE DEVICES UTILIZED TO PREVENT FALLS:  Life alert? No  Use of a cane, walker or w/c? Yes  Grab bars in the bathroom? Yes  Shower chair or bench in shower? Yes  Elevated toilet seat or a handicapped toilet? Yes    Cognitive Function:   Normal cognitive status assessed by direct observation by this Nurse Health Advisor. No abnormalities found.        Immunizations Immunization History  Administered Date(s) Administered  . Pneumococcal Polysaccharide-23 11/03/2010, 05/27/2013, 06/23/2019  . Tdap 08/12/2019    TDAP status: Up to date  Flu Vaccine status: Due, Education has been provided regarding the importance of this vaccine. Advised may receive this vaccine at local pharmacy or Health Dept. Aware to provide a copy of the vaccination record if obtained from local pharmacy or Health Dept. Verbalized acceptance and understanding.  Pneumococcal vaccine status: Up to date  Covid-19 vaccine status: Completed vaccines  Qualifies for Shingles Vaccine? Yes   Zostavax completed No   Shingrix Completed?: No.    Education has been provided regarding the importance of this vaccine. Patient has been advised to call insurance company to determine out of pocket expense if they have not yet received this vaccine. Advised may also receive vaccine at local pharmacy or Health Dept. Verbalized acceptance and understanding.  Screening Tests Health Maintenance  Topic Date Due  . Hepatitis C Screening  Never done  . COVID-19 Vaccine (1) Never done  . OPHTHALMOLOGY EXAM  Never done  . DEXA SCAN  Never done  . MAMMOGRAM  04/05/2020  . FOOT EXAM  06/22/2020  . PNA vac Low Risk Adult (2 of 2 - PCV13) 06/22/2020  . HEMOGLOBIN A1C  09/05/2020  . INFLUENZA VACCINE  09/12/2020  . COLONOSCOPY (Pts 45-3yr Insurance coverage will need to be confirmed)  12/27/2021  . TETANUS/TDAP  08/11/2029  . HPV VACCINES  Aged Out    Health Maintenance  Health Maintenance Due  Topic Date  Due  . Hepatitis C Screening  Never done  . COVID-19 Vaccine (1) Never done  . OPHTHALMOLOGY EXAM  Never done  . DEXA SCAN  Never done  . MAMMOGRAM  04/05/2020    Colorectal cancer screening: Type of screening: Colonoscopy. Completed 12/28/2011. Repeat every 10 years  Mammogram status: Ordered 06/01/2020. Pt provided with contact info and advised to call to schedule appt.   Bone Density status: Ordered 06/01/2020. Pt provided with contact info and advised to call to schedule appt.  Lung Cancer Screening: (Low Dose CT Chest recommended if Age 66-80years, 30 pack-year currently smoking OR have quit w/in 15years.) does not qualify.   Lung Cancer Screening Referral: N/A   Additional Screening:  Hepatitis C Screening: does qualify;   Vision Screening: Recommended annual ophthalmology exams for early detection of glaucoma and other disorders of the eye. Is the patient up to date with their annual eye exam?  No  Who is the provider or what is the name of the office in which the patient attends annual eye exams? Does not have an eye doctor If pt is not established with a provider, would they like to be referred to a provider to establish care? No .   Dental Screening: Recommended annual dental exams for proper oral hygiene  Community Resource Referral / Chronic Care Management: CRR required this visit?  No   CCM required this visit?  No  Plan:     I have personally reviewed and noted the following in the patient's chart:   . Medical and social history . Use of alcohol, tobacco or illicit drugs  . Current medications and supplements . Functional ability and status . Nutritional status . Physical activity . Advanced directives . List of other physicians . Hospitalizations, surgeries, and ER visits in previous 12 months . Vitals . Screenings to include cognitive, depression, and falls . Referrals and appointments  In addition, I have reviewed and discussed with patient  certain preventive protocols, quality metrics, and best practice recommendations. A written personalized care plan for preventive services as well as general preventive health recommendations were provided to patient.     Ofilia Neas, LPN   04/08/8344   Nurse Notes: None

## 2020-06-01 ENCOUNTER — Other Ambulatory Visit: Payer: Self-pay

## 2020-06-01 ENCOUNTER — Ambulatory Visit (INDEPENDENT_AMBULATORY_CARE_PROVIDER_SITE_OTHER): Payer: Medicare Other

## 2020-06-01 DIAGNOSIS — Z0000 Encounter for general adult medical examination without abnormal findings: Secondary | ICD-10-CM | POA: Diagnosis not present

## 2020-06-01 DIAGNOSIS — Z1231 Encounter for screening mammogram for malignant neoplasm of breast: Secondary | ICD-10-CM | POA: Diagnosis not present

## 2020-06-01 DIAGNOSIS — Z780 Asymptomatic menopausal state: Secondary | ICD-10-CM

## 2020-06-01 NOTE — Patient Instructions (Signed)
Kim [name] , Thank you for taking time to come for your Medicare Wellness Visit. I appreciate your ongoing commitment to your health goals. Please review the following plan we discussed and let me know if I can assist you in the future.   Screening recommendations/referrals: Colonoscopy: Up to date, next due 12/27/2021 Mammogram: Currently due, orders placed this visit.  Bone Density: Currently due orders placed this visit. Recommended yearly ophthalmology/optometry visit for glaucoma screening and checkup Recommended yearly dental visit for hygiene and checkup  Vaccinations: Influenza vaccine: Up to date, next due fall 2022  Pneumococcal vaccine: Up to date, next due 06/22/2020 Tdap vaccine: Up to date, next due 08/11/2029 Shingles vaccine: Currently due for Shingrix, if you would like to receive we recommend that you do so at your local pharmacy.    Advanced directives: Advance directive discussed with you today. Even though you declined this today please call our office should you change your mind and we can give you the proper paperwork for you to fill out.   Conditions/risks identified: None   Next appointment: 06/07/2021 @ 10:20 am with Nurse Health advisor    Preventive Care 34 Years and Older, Female Preventive care refers to lifestyle choices and visits with your health care provider that can promote health and wellness. What does preventive care include?  A yearly physical exam. This is also called an annual well check.  Dental exams once or twice a year.  Routine eye exams. Ask your health care provider how often you should have your eyes checked.  Personal lifestyle choices, including:  Daily care of your teeth and gums.  Regular physical activity.  Eating a healthy diet.  Avoiding tobacco and drug use.  Limiting alcohol use.  Practicing safe sex.  Taking low-dose aspirin every day.  Taking vitamin and mineral supplements as recommended by your health  care provider. What happens during an annual well check? The services and screenings done by your health care provider during your annual well check will depend on your age, overall health, lifestyle risk factors, and family history of disease. Counseling  Your health care provider may ask you questions about your:  Alcohol use.  Tobacco use.  Drug use.  Emotional well-being.  Home and relationship well-being.  Sexual activity.  Eating habits.  History of falls.  Memory and ability to understand (cognition).  Work and work Statistician.  Reproductive health. Screening  You may have the following tests or measurements:  Height, weight, and BMI.  Blood pressure.  Lipid and cholesterol levels. These may be checked every 5 years, or more frequently if you are over 20 years old.  Skin check.  Lung cancer screening. You may have this screening every year starting at age 87 if you have a 30-pack-year history of smoking and currently smoke or have quit within the past 15 years.  Fecal occult blood test (FOBT) of the stool. You may have this test every year starting at age 72.  Flexible sigmoidoscopy or colonoscopy. You may have a sigmoidoscopy every 5 years or a colonoscopy every 10 years starting at age 79.  Hepatitis C blood test.  Hepatitis B blood test.  Sexually transmitted disease (STD) testing.  Diabetes screening. This is done by checking your blood sugar (glucose) after you have not eaten for a while (fasting). You may have this done every 1-3 years.  Bone density scan. This is done to screen for osteoporosis. You may have this done starting at age 79.  Mammogram. This  may be done every 1-2 years. Talk to your health care provider about how often you should have regular mammograms. Talk with your health care provider about your test results, treatment options, and if necessary, the need for more tests. Vaccines  Your health care provider may recommend certain  vaccines, such as:  Influenza vaccine. This is recommended every year.  Tetanus, diphtheria, and acellular pertussis (Tdap, Td) vaccine. You may need a Td booster every 10 years.  Zoster vaccine. You may need this after age 49.  Pneumococcal 13-valent conjugate (PCV13) vaccine. One dose is recommended after age 51.  Pneumococcal polysaccharide (PPSV23) vaccine. One dose is recommended after age 17. Talk to your health care provider about which screenings and vaccines you need and how often you need them. This information is not intended to replace advice given to you by your health care provider. Make sure you discuss any questions you have with your health care provider. Document Released: 02/25/2015 Document Revised: 10/19/2015 Document Reviewed: 11/30/2014 Elsevier Interactive Patient Education  2017 [employer] Prevention in the Home Falls can cause injuries. They can happen to people of all ages. There are many things you can do to make your home safe and to help prevent falls. What can I do on the outside of my home?  Regularly fix the edges of walkways and driveways and fix any cracks.  Remove anything that might make you trip as you walk through a door, such as a raised step or threshold.  Trim any bushes or trees on the path to your home.  Use bright outdoor lighting.  Clear any walking paths of anything that might make someone trip, such as rocks or tools.  Regularly check to see if handrails are loose or broken. Make sure that both sides of any steps have handrails.  Any raised decks and porches should have guardrails on the edges.  Have any leaves, snow, or ice cleared regularly.  Use sand or salt on walking paths during winter.  Clean up any spills in your garage right away. This includes oil or grease spills. What can I do in the bathroom?  Use night lights.  Install grab bars by the toilet and in the tub and shower. Do not use towel bars as grab  bars.  Use non-skid mats or decals in the tub or shower.  If you need to sit down in the shower, use a plastic, non-slip stool.  Keep the floor dry. Clean up any water that spills on the floor as soon as it happens.  Remove soap buildup in the tub or shower regularly.  Attach bath mats securely with double-sided non-slip rug tape.  Do not have throw rugs and other things on the floor that can make you trip. What can I do in the bedroom?  Use night lights.  Make sure that you have a light by your bed that is easy to reach.  Do not use any sheets or blankets that are too big for your bed. They should not hang down onto the floor.  Have a firm chair that has side arms. You can use this for support while you get dressed.  Do not have throw rugs and other things on the floor that can make you trip. What can I do in the kitchen?  Clean up any spills right away.  Avoid walking on wet floors.  Keep items that you use a lot in easy-to-reach places.  If you need to reach something above  you, use a strong step stool that has a grab bar.  Keep electrical cords out of the way.  Do not use floor polish or wax that makes floors slippery. If you must use wax, use non-skid floor wax.  Do not have throw rugs and other things on the floor that can make you trip. What can I do with my stairs?  Do not leave any items on the stairs.  Make sure that there are handrails on both sides of the stairs and use them. Fix handrails that are broken or loose. Make sure that handrails are as long as the stairways.  Check any carpeting to make sure that it is firmly attached to the stairs. Fix any carpet that is loose or worn.  Avoid having throw rugs at the top or bottom of the stairs. If you do have throw rugs, attach them to the floor with carpet tape.  Make sure that you have a light switch at the top of the stairs and the bottom of the stairs. If you do not have them, ask someone to add them for  you. What else can I do to help prevent falls?  Wear shoes that:  Do not have high heels.  Have rubber bottoms.  Are comfortable and fit you well.  Are closed at the toe. Do not wear sandals.  If you use a stepladder:  Make sure that it is fully opened. Do not climb a closed stepladder.  Make sure that both sides of the stepladder are locked into place.  Ask someone to hold it for you, if possible.  Clearly mark and make sure that you can see:  Any grab bars or handrails.  First and last steps.  Where the edge of each step is.  Use tools that help you move around (mobility aids) if they are needed. These include:  Canes.  Walkers.  Scooters.  Crutches.  Turn on the lights when you go into a dark area. Replace any light bulbs as soon as they burn out.  Set up your furniture so you have a clear path. Avoid moving your furniture around.  If any of your floors are uneven, fix them.  If there are any pets around you, be aware of where they are.  Review your medicines with your doctor. Some medicines can make you feel dizzy. This can increase your chance of falling. Ask your doctor what other things that you can do to help prevent falls. This information is not intended to replace advice given to you by your health care provider. Make sure you discuss any questions you have with your health care provider. Document Released: 11/25/2008 Document Revised: 07/07/2015 Document Reviewed: 03/05/2014 Elsevier Interactive Patient Education  2017 [employer].

## 2020-06-06 ENCOUNTER — Other Ambulatory Visit: Payer: Self-pay | Admitting: "Endocrinology

## 2020-06-06 DIAGNOSIS — E1165 Type 2 diabetes mellitus with hyperglycemia: Secondary | ICD-10-CM

## 2020-06-09 ENCOUNTER — Ambulatory Visit: Payer: Medicare Other | Attending: Critical Care Medicine

## 2020-06-09 DIAGNOSIS — Z23 Encounter for immunization: Secondary | ICD-10-CM

## 2020-06-09 NOTE — Progress Notes (Signed)
   Covid-19 Vaccination Clinic  Name:  Kim [name]    MRN: [id number] DOB: [birth year]  06/09/2020  Ms. Spoon was observed post Covid-19 immunization for 15 minutes without incident. She was provided with Vaccine Information Sheet and instruction to access the V-Safe system.   [name] was instructed to call 911 with any severe reactions post vaccine: [name] Difficulty breathing  . Swelling of face and throat  . A fast heartbeat  . A bad rash all over body  . Dizziness and weakness   Immunizations Administered    Name Date Dose VIS Date Route   Moderna Covid-19 Booster Vaccine 06/09/2020 11:37 AM 0.25 mL 12/02/2019 Intramuscular   Manufacturer: Moderna   Lot: [id number]   [place]: [id number]

## 2020-06-15 ENCOUNTER — Other Ambulatory Visit: Payer: Self-pay | Admitting: "Endocrinology

## 2020-06-28 ENCOUNTER — Other Ambulatory Visit: Payer: Self-pay | Admitting: "Endocrinology

## 2020-07-06 ENCOUNTER — Encounter: Payer: Self-pay | Admitting: "Endocrinology

## 2020-07-06 ENCOUNTER — Ambulatory Visit (INDEPENDENT_AMBULATORY_CARE_PROVIDER_SITE_OTHER): Payer: Medicare Other | Admitting: "Endocrinology

## 2020-07-06 ENCOUNTER — Other Ambulatory Visit: Payer: Self-pay

## 2020-07-06 VITALS — BP 138/82 | HR 96 | Ht 66.0 in | Wt 182.8 lb

## 2020-07-06 DIAGNOSIS — E1165 Type 2 diabetes mellitus with hyperglycemia: Secondary | ICD-10-CM | POA: Diagnosis not present

## 2020-07-06 LAB — POCT GLYCOSYLATED HEMOGLOBIN (HGB A1C): HbA1c, POC (controlled diabetic range): 6.9 % (ref 0.0–7.0)

## 2020-07-06 NOTE — Patient Instructions (Signed)

## 2020-07-06 NOTE — Progress Notes (Signed)
07/06/2020, 4:51 PM   Endocrinology follow-up note  Subjective:    Patient ID: Kim [name], female    DOB: 28-Apr-1955.  Kim [name] is being seen in follow-up after she was seen in consultation for management of currently uncontrolled symptomatic diabetes requested by  [name], NP.   Past Medical History:  Diagnosis Date  . Cancer [facility])    history of leukemia  . Community acquired pneumonia 05/26/2013  . Diabetes mellitus without complication ([place])   . Encounter for screening mammogram for malignant neoplasm of breast 03/26/2019  . Hypercholesteremia   . Hypertension   . Neuropathy   . Seasonal allergies     Past Surgical History:  Procedure Laterality Date  . COLONOSCOPY  12/28/2011   Procedure: COLONOSCOPY;  Surgeon: Danie Binder, MD;  Location: [facility];  Service: Endoscopy;  Laterality: N/A;  9:30 AM-changed to 8:30 [name] to notify pt  . LAPAROSCOPY    . TUBAL LIGATION    . TUNNELED VENOUS CATHETER PLACEMENT      Social History   Socioeconomic History  . Marital status: Divorced    Spouse name: Not on file  . Number of children: 2  . Years of education: Not on file  . Highest education level: 11th grade  Occupational History  . Not on file  Tobacco Use  . Smoking status: Current Every Day Smoker    Packs/day: 0.50    Years: 40.00    Pack years: 20.00  . Smokeless tobacco: Never Used  Substance and Sexual Activity  . Alcohol use: No  . Drug use: No  . Sexual activity: Not Currently  Other Topics Concern  . Not on file  Social History Narrative   Lives with 1 year old mother -caregiver    2 grown children   Son [name] in [place]    Daughter [name] in [place] with daughter [name]       Enjoys: shopping, play games       Diet: not as good a she would like, eats all food groups   Caffeine: 2 cups of coffee in the mornings,  coke daily   Water: 3-4 cups daily       Wear seat belt    Smoke detectors at home   Does not use phone while driving      Social Determinants of Health   Financial Resource Strain: Low Risk   . Difficulty of Paying Living Expenses: Not hard at all  Food Insecurity: No Food Insecurity  . Worried About Charity fundraiser in the Last Year: Never true  . Ran Out of Food in the Last Year: Never true  Transportation Needs: No Transportation Needs  . Lack of Transportation (Medical): No  . Lack of Transportation (Non-Medical): No  Physical Activity: Inactive  . Days of Exercise per Week: 0 days  . Minutes of Exercise per Session: 0 min  Stress: No Stress Concern Present  . Feeling of Stress : Not at all  Social Connections: Socially Isolated  . Frequency of Communication with Friends and Family:  More than three times a week  . Frequency of Social Gatherings with Friends and Family: More than three times a week  . Attends Religious Services: Never  . Active Member of Clubs or Organizations: No  . Attends Archivist Meetings: Never  . Marital Status: Divorced    Family History  Problem Relation Age of Onset  . Hypothyroidism Mother   . Diabetes Mother   . Hypertension Mother   . Thyroid disease Mother   . Hypertension Father   . Stroke Father   . Colon cancer Neg Hx     Outpatient Encounter Medications as of 07/06/2020  Medication Sig  . acetaminophen (TYLENOL) 500 MG tablet Take 500 mg by mouth every 6 (six) hours as needed. Pain.  [name] atorvastatin (LIPITOR) 10 MG tablet Take 1 tablet (10 mg total) by mouth daily at 6 PM.  . Blood Glucose Monitoring Suppl (ACCU-CHEK GUIDE) w/Device KIT 1 Piece by Does not apply route as directed.  . GE100 BLOOD GLUCOSE TEST test strip USE AS DIRECTED TO TEST TWICE DAILY  . glipiZIDE (GLUCOTROL XL) 5 MG 24 hr tablet TAKE ONE TABLET BY MOUTH DAILY WITH BREAKFAST  . Lancets MISC 1 each by Does not apply route 4 (four) times daily. Use  as directed to check blood glucose four times daily  . losartan-hydrochlorothiazide (HYZAAR) 100-25 MG tablet Take 1 tablet by mouth daily.  . metFORMIN (GLUCOPHAGE) 1000 MG tablet TAKE ONE TABLET BY MOUTH TWICE DAILY WITH A MEAL.  [name] omeprazole (PRILOSEC) 20 MG capsule Take 1 capsule (20 mg total) by mouth daily as needed. Heartburn  . silver sulfADIAZINE (SILVADENE) 1 % cream Apply 1 application topically 2 (two) times daily.   No facility-administered encounter medications on file as of 07/06/2020.    ALLERGIES: No Known Allergies  VACCINATION STATUS: Immunization History  Administered Date(s) Administered  . Moderna SARS-COV2 Booster Vaccination 06/09/2020  . Pneumococcal Polysaccharide-23 11/03/2010, 05/27/2013, 06/23/2019  . Tdap 08/12/2019    Diabetes She presents for her follow-up diabetic visit. She has type 2 diabetes mellitus. Onset time: She was diagnosed at approximate age of 28 years. Her disease course has been improving. There are no hypoglycemic associated symptoms. Pertinent negatives for hypoglycemia include no confusion, headaches, pallor or seizures. Associated symptoms include blurred vision and fatigue. Pertinent negatives for diabetes include no chest pain, no polydipsia, no polyphagia and no polyuria. There are no hypoglycemic complications. Symptoms are improving. There are no diabetic complications. Risk factors for coronary artery disease include diabetes mellitus, dyslipidemia, hypertension, family history, tobacco exposure, sedentary lifestyle and post-menopausal. Current diabetic treatment includes oral agent (monotherapy). Her weight is fluctuating minimally. She is following a generally unhealthy diet. When asked about meal planning, she reported none. She has not had a previous visit with a dietitian. She rarely (She is wheelchair-bound due to deconditioning/disequilibrium.  ) participates in exercise. Her home blood glucose trend is fluctuating minimally. Her  breakfast blood glucose range is generally 130-140 mg/dl. Her bedtime blood glucose range is generally 140-180 mg/dl. Her overall blood glucose range is 140-180 mg/dl. (She presents with continued improvement in her glycemic profile.  She has on target fasting glucose profile with no hypoglycemia.  Her point-of-care A1c is 6.9%, overall improving from 11.5%.  No major hypoglycemia documented or reported.    ) An ACE inhibitor/angiotensin II receptor blocker is being taken. Eye exam is current.  Hyperlipidemia This is a chronic problem. The current episode started more than 1 year ago. Exacerbating diseases include  diabetes. Pertinent negatives include no chest pain, myalgias or shortness of breath. Current antihyperlipidemic treatment includes statins. Risk factors for coronary artery disease include diabetes mellitus, hypertension, dyslipidemia, family history, post-menopausal and a sedentary lifestyle.  Hypertension This is a chronic problem. The current episode started more than 1 year ago. Associated symptoms include blurred vision. Pertinent negatives include no chest pain, headaches, palpitations or shortness of breath. Risk factors for coronary artery disease include diabetes mellitus, dyslipidemia, family history, post-menopausal state, smoking/tobacco exposure and sedentary lifestyle.     Review of Systems  Constitutional: Positive for fatigue. Negative for chills, fever and unexpected weight change.  HENT: Negative for trouble swallowing and voice change.   Eyes: Positive for blurred vision. Negative for visual disturbance.  Respiratory: Negative for cough, shortness of breath and wheezing.   Cardiovascular: Negative for chest pain, palpitations and leg swelling.  Gastrointestinal: Negative for diarrhea, nausea and vomiting.  Endocrine: Negative for cold intolerance, heat intolerance, polydipsia, polyphagia and polyuria.  Musculoskeletal: Positive for gait problem. Negative for  arthralgias and myalgias.       She is wheelchair-bound due to deconditioning/disequilibrium.  Skin: Negative for color change, pallor, rash and wound.  Neurological: Negative for seizures and headaches.  Psychiatric/Behavioral: Negative for confusion and suicidal ideas.    Objective:    Vitals with BMI 07/06/2020 06/01/2020 03/08/2020  Height _0  (No Data) _1   Weight 182 lbs 13 oz (No Data) 176 lbs 13 oz  BMI 20.35 - 59.74  Systolic 163 (No Data) 845  Diastolic 82 (No Data) 56  Pulse 96 (No Data) 88    BP 138/82   Pulse 96   Ht _2  (1.676 m)   Wt 182 lb 12.8 oz (82.9 kg)   BMI 29.50 kg/m   Wt Readings from Last 3 Encounters:  07/06/20 182 lb 12.8 oz (82.9 kg)  03/08/20 176 lb 12.8 oz (80.2 kg)  09/01/19 170 lb (77.1 kg)     Physical Exam Constitutional:      Appearance: She is well-developed.  HENT:     Head: Normocephalic and atraumatic.  Neck:     Thyroid: No thyromegaly.     Trachea: No tracheal deviation.  Cardiovascular:     Rate and Rhythm: Normal rate and regular rhythm.  Pulmonary:     Effort: Pulmonary effort is normal.     Breath sounds: Normal breath sounds.  Abdominal:     Tenderness: There is no abdominal tenderness. There is no guarding.  Musculoskeletal:     Cervical back: Normal range of motion and neck supple.     Comments: She is wheelchair-bound.  Skin:    General: Skin is warm and dry.     Coloration: Skin is not pale.     Findings: No erythema or rash.  Neurological:     Mental Status: She is alert and oriented to person, place, and time.     Cranial Nerves: No cranial nerve deficit.     Coordination: Coordination normal.     Deep Tendon Reflexes: Reflexes are normal and symmetric.  Psychiatric:        Judgment: Judgment normal.    CMP ( most recent) CMP     Component Value Date/Time   NA 143 04/26/2020 1450   K 4.2 04/26/2020 1450   CL 98 04/26/2020 1450   CO2 24 04/26/2020 1450   GLUCOSE 206 (H) 04/26/2020 1450    GLUCOSE 250 (H) 03/26/2019 1517   BUN 22 04/26/2020 1450   CREATININE  1.06 (H) 04/26/2020 1450   CREATININE 0.93 03/26/2019 1517   CALCIUM 9.6 04/26/2020 1450   PROT 7.2 04/26/2020 1450   ALBUMIN 4.5 04/26/2020 1450   AST 21 04/26/2020 1450   ALT 22 04/26/2020 1450   ALKPHOS 132 (H) 04/26/2020 1450   BILITOT <0.2 04/26/2020 1450   GFRNONAA 59 (L) 12/02/2019 1203   GFRNONAA 65 03/26/2019 1517   GFRAA 68 12/02/2019 1203   GFRAA 75 03/26/2019 1517    Diabetic Labs (most recent): Lab Results  Component Value Date   HGBA1C 6.9 07/06/2020   HGBA1C 7.2 (A) 03/08/2020   HGBA1C 6.9 (A) 09/01/2019    Lipid Panel     Component Value Date/Time   CHOL 150 12/02/2019 1203   TRIG 91 12/02/2019 1203   HDL 40 12/02/2019 1203   CHOLHDL 3.8 12/02/2019 1203   CHOLHDL 4.3 03/26/2019 1517   LDLCALC 93 12/02/2019 1203   LDLCALC 122 (H) 03/26/2019 1517   LABVLDL 17 12/02/2019 1203     Assessment & Plan:   1. Uncontrolled type 2 diabetes mellitus with hyperglycemia ([place])  - Kim [name] has currently uncontrolled symptomatic type 2 DM since  65 years of age.   She presents her log which shows significantly improved glycemic profile to near target levels.     She presents with continued improvement in her glycemic profile.  She has on target fasting glucose profile with no hypoglycemia.  Her point-of-care A1c is 6.9%, overall improving from 11.5%.  No major hypoglycemia documented or reported.    Recent labs reviewed. - I had a long discussion with her about the progressive nature of diabetes and the pathology behind its complications. -her diabetes is complicated by sedentary life, smoking and she remains at a high risk for more acute and chronic complications which include CAD, CVA, CKD, retinopathy, and neuropathy. These are all discussed in detail with her.  - I have counseled her on diet  and weight management  by adopting a carbohydrate restricted/protein rich diet. Patient is  encouraged to switch to  unprocessed or minimally processed     complex starch and increased protein intake (animal or plant source), fruits, and vegetables. -  she is advised to stick to a routine mealtimes to eat 3 meals  a day and avoid unnecessary snacks ( to snack only to correct hypoglycemia).   - she acknowledges that there is a room for improvement in her food and drink choices. - Suggestion is made for her to avoid simple carbohydrates  from her diet including Cakes, Sweet Desserts, Ice Cream, Soda (diet and regular), Sweet Tea, Candies, Chips, Cookies, Store Bought Juices, Alcohol in Excess of  1-2 drinks a day, Artificial Sweeteners,  Coffee Creamer, and "Sugar-free" Products, Lemonade. This will help patient to have more stable blood glucose profile and potentially avoid unintended weight gain.  - I have approached her with the following individualized plan to manage  her diabetes and patient agrees:   -Based on her presentation with near target glycemic profile, she will not need insulin treatment for now.   She wishes to avoid insulin treatment for now.    She is willing to continue to monitor blood glucose twice a day-daily before breakfast and at bedtime.   - she is encouraged to call clinic for blood glucose levels less than 70 or greater than 200 mg/Dl at fasting.  - she is advised to continue metformin 1000 mg p.o. twice daily-daily after breakfast and after supper.  She will  also benefit from continued treatment with glipizide 5 mg XL p.o. daily at breakfast.       - she is not a candidate for SGLT2 inhibitors nor incretin therapy.   - Specific targets for  A1c;  LDL, HDL,  and Triglycerides were discussed with the patient.  2) Blood Pressure /Hypertension: Her blood pressure is controlled to target. she is advised to continue her current medications including losartan/HCTZ 100/25 mg p.o. daily with breakfast .  3) Lipids/Hyperlipidemia:   Review of her recent lipid panel  showed improved LDL to 93 from 122.  However, she is on atorvastatin.  She is advised to continue atorvastatin 10 mg p.o. daily at bedtime.     I discussed and prescribed atorvastatin 10 mg p.o. daily at bedtime.    Side effects and precautions discussed with her.  4)  Weight/Diet:  Body mass index is 29.5 kg/m.  -She is a candidate for some  weight loss.   I discussed with her the fact that loss of 5 - 10% of her  current body weight will have the most impact on her diabetes management.  Exercise, and detailed carbohydrates information provided  -  detailed on discharge instructions.  5) Chronic Care/Health Maintenance:  -she  is on ACEI/ARB and Statin medications and  is encouraged to initiate and continue to follow up with Ophthalmology, Dentist,  Podiatrist at least yearly or according to recommendations, and advised to  Quit smoking. I have recommended yearly flu vaccine and pneumonia vaccine at least every 5 years;  and  sleep for at least 7 hours a day.  She is wheelchair-bound, cannot exercise optimally.  - she is  advised to maintain close follow up with [name], NP for primary care needs, as well as her other providers for optimal and coordinated care.   I spent 40 minutes in the care of the patient today including review of labs from [place], Lipids, Thyroid Function, Hematology (current and previous including abstractions from other facilities); face-to-face time discussing  her blood glucose readings/logs, discussing hypoglycemia and hyperglycemia episodes and symptoms, medications doses, her options of short and long term treatment based on the latest standards of care / guidelines;  discussion about incorporating lifestyle medicine;  and documenting the encounter.    Please refer to Patient Instructions for Blood Glucose Monitoring and Insulin/Medications Dosing Guide"  in media tab for additional information. Please  also refer to " Patient Self Inventory" in the Media  tab for  reviewed elements of pertinent patient history.  [name] participated in the discussions, expressed understanding, and voiced agreement with the above plans.  All questions were answered to her satisfaction. she is encouraged to contact clinic should she have any questions or concerns prior to her return visit.    Follow up plan: - Return in about 4 months (around 11/06/2020) for F/U with Pre-visit Labs, Meter, Logs, A1c here.Glade Lloyd, MD [facility] [address] [place], [place] [address] Phone: [phone]  Fax: [phone]    07/06/2020, 4:51 PM  This note was partially dictated with voice recognition software. Similar sounding words can be transcribed inadequately or may not  be corrected upon review.

## 2020-07-12 ENCOUNTER — Other Ambulatory Visit: Payer: Self-pay | Admitting: "Endocrinology

## 2020-07-19 ENCOUNTER — Other Ambulatory Visit: Payer: Self-pay | Admitting: *Deleted

## 2020-07-19 ENCOUNTER — Telehealth: Payer: Self-pay

## 2020-07-19 DIAGNOSIS — I10 Essential (primary) hypertension: Secondary | ICD-10-CM

## 2020-07-19 MED ORDER — LOSARTAN POTASSIUM-HCTZ 100-25 MG PO TABS: 1.0000 | ORAL_TABLET | Freq: Every day | ORAL | 1 refills | Status: DC

## 2020-07-19 NOTE — Telephone Encounter (Signed)
Patient called need med refill  losartan-hydrochlorothiazide (HYZAAR) 100-25 MG tablet  Pharmacy: [facility]

## 2020-07-19 NOTE — Telephone Encounter (Signed)
Pt medication sent to pharmacy  

## 2020-07-29 ENCOUNTER — Other Ambulatory Visit: Payer: Self-pay | Admitting: "Endocrinology

## 2020-08-01 ENCOUNTER — Other Ambulatory Visit: Payer: Self-pay | Admitting: "Endocrinology

## 2020-08-29 ENCOUNTER — Other Ambulatory Visit: Payer: Self-pay | Admitting: Nurse Practitioner

## 2020-08-29 ENCOUNTER — Other Ambulatory Visit: Payer: Self-pay | Admitting: "Endocrinology

## 2020-08-29 DIAGNOSIS — E1169 Type 2 diabetes mellitus with other specified complication: Secondary | ICD-10-CM

## 2020-08-29 DIAGNOSIS — E785 Hyperlipidemia, unspecified: Secondary | ICD-10-CM

## 2020-08-29 DIAGNOSIS — I10 Essential (primary) hypertension: Secondary | ICD-10-CM

## 2020-11-02 DIAGNOSIS — E1165 Type 2 diabetes mellitus with hyperglycemia: Secondary | ICD-10-CM | POA: Diagnosis not present

## 2020-11-03 LAB — TSH: TSH: 5.8 u[IU]/mL — ABNORMAL HIGH (ref 0.450–4.500)

## 2020-11-03 LAB — COMPREHENSIVE METABOLIC PANEL
ALT: 20 IU/L (ref 0–32)
AST: 15 IU/L (ref 0–40)
Albumin/Globulin Ratio: 1.5 (ref 1.2–2.2)
Albumin: 4.1 g/dL (ref 3.8–4.8)
Alkaline Phosphatase: 147 IU/L — ABNORMAL HIGH (ref 44–121)
BUN/Creatinine Ratio: 20 (ref 12–28)
BUN: 22 mg/dL (ref 8–27)
Bilirubin Total: <0.2 mg/dL (ref 0.0–1.2)
CO2: 24 mmol/L (ref 20–29)
Calcium: 9.5 mg/dL (ref 8.7–10.3)
Chloride: 103 mmol/L (ref 96–106)
Creatinine, Ser: 1.08 mg/dL — ABNORMAL HIGH (ref 0.57–1.00)
Globulin, Total: 2.7 g/dL (ref 1.5–4.5)
Glucose: 146 mg/dL — ABNORMAL HIGH (ref 65–99)
Potassium: 4.8 mmol/L (ref 3.5–5.2)
Sodium: 145 mmol/L — ABNORMAL HIGH (ref 134–144)
Total Protein: 6.8 g/dL (ref 6.0–8.5)
eGFR: 57 mL/min/{1.73_m2} — ABNORMAL LOW (ref >59)

## 2020-11-03 LAB — LIPID PANEL
Chol/HDL Ratio: 3.3 ratio (ref 0.0–4.4)
Cholesterol, Total: 135 mg/dL (ref 100–199)
HDL: 41 mg/dL (ref >39)
LDL Chol Calc (NIH): 72 mg/dL (ref 0–99)
Triglycerides: 124 mg/dL (ref 0–149)
VLDL Cholesterol Cal: 22 mg/dL (ref 5–40)

## 2020-11-03 LAB — T4, FREE: Free T4: 1.17 ng/dL (ref 0.82–1.77)

## 2020-11-08 ENCOUNTER — Ambulatory Visit (INDEPENDENT_AMBULATORY_CARE_PROVIDER_SITE_OTHER): Payer: Medicare Other | Admitting: "Endocrinology

## 2020-11-08 ENCOUNTER — Encounter: Payer: Self-pay | Admitting: "Endocrinology

## 2020-11-08 ENCOUNTER — Other Ambulatory Visit: Payer: Self-pay

## 2020-11-08 VITALS — BP 123/82 | HR 96 | Ht 66.0 in | Wt 180.0 lb

## 2020-11-08 DIAGNOSIS — E1165 Type 2 diabetes mellitus with hyperglycemia: Secondary | ICD-10-CM | POA: Diagnosis not present

## 2020-11-08 DIAGNOSIS — E782 Mixed hyperlipidemia: Secondary | ICD-10-CM | POA: Diagnosis not present

## 2020-11-08 DIAGNOSIS — I10 Essential (primary) hypertension: Secondary | ICD-10-CM | POA: Diagnosis not present

## 2020-11-08 DIAGNOSIS — E038 Other specified hypothyroidism: Secondary | ICD-10-CM | POA: Diagnosis not present

## 2020-11-08 LAB — POCT GLYCOSYLATED HEMOGLOBIN (HGB A1C): HbA1c, POC (controlled diabetic range): 7.2 % — AB (ref 0.0–7.0)

## 2020-11-08 MED ORDER — CHOLECALCIFEROL 50 MCG (2000 UT) PO CAPS: 1.0000 | ORAL_CAPSULE | Freq: Every day | ORAL | 1 refills | Status: AC

## 2020-11-08 NOTE — Patient Instructions (Signed)

## 2020-11-08 NOTE — Progress Notes (Signed)
11/08/2020, 3:29 PM   Endocrinology follow-up note  Subjective:    Patient ID: Kim [name], female    DOB: 14-Aug-1955.  Kim [name] is being seen in follow-up after she was seen in consultation for management of currently uncontrolled symptomatic diabetes requested by  [name], NP.   Past Medical History:  Diagnosis Date   Cancer [facility])    history of leukemia   Community acquired pneumonia 05/26/2013   Diabetes mellitus without complication ([place])    Encounter for screening mammogram for malignant neoplasm of breast 03/26/2019   Hypercholesteremia    Hypertension    Neuropathy    Seasonal allergies     Past Surgical History:  Procedure Laterality Date   COLONOSCOPY  12/28/2011   Procedure: COLONOSCOPY;  Surgeon: Danie Binder, MD;  Location: [facility];  Service: Endoscopy;  Laterality: N/A;  9:30 AM-changed to 8:30 [name] to notify pt   LAPAROSCOPY     TUBAL LIGATION     TUNNELED VENOUS CATHETER PLACEMENT      Social History   Socioeconomic History   Marital status: Divorced    Spouse name: Not on file   Number of children: 2   Years of education: Not on file   Highest education level: 11th grade  Occupational History   Not on file  Tobacco Use   Smoking status: Every Day    Packs/day: 0.50    Years: 40.00    Pack years: 20.00    Types: Cigarettes   Smokeless tobacco: Never  Substance and Sexual Activity   Alcohol use: No   Drug use: No   Sexual activity: Not Currently  Other Topics Concern   Not on file  Social History Narrative   Lives with 32 year old mother -caregiver    2 grown children   Son [name] in [place]    Daughter [name] in [place] with daughter [name]       Enjoys: shopping, play games       Diet: not as good a she would like, eats all food groups   Caffeine: 2 cups of coffee in the mornings, coke daily   Water:  3-4 cups daily       Wear seat belt    Smoke detectors at home   Does not use phone while driving      Social Determinants of Radio broadcast assistant Strain: Low Risk    Difficulty of Paying Living Expenses: Not hard at all  Food Insecurity: No Food Insecurity   Worried About Charity fundraiser in the Last Year: Never true   Arboriculturist in the Last Year: Never true  Transportation Needs: No Transportation Needs   Lack of Transportation (Medical): No   Lack of Transportation (Non-Medical): No  Physical Activity: Inactive   Days of Exercise per Week: 0 days   Minutes of Exercise per Session: 0 min  Stress: No Stress Concern Present   Feeling of Stress : Not at all  Social Connections: Socially Isolated   Frequency of Communication with Friends  and Family: More than three times a week   Frequency of Social Gatherings with Friends and Family: More than three times a week   Attends Religious Services: Never   Marine scientist or Organizations: No   Attends Music therapist: Never   Marital Status: Divorced    Family History  Problem Relation Age of Onset   Hypothyroidism Mother    Diabetes Mother    Hypertension Mother    Thyroid disease Mother    Hypertension Father    Stroke Father    Colon cancer Neg Hx     Outpatient Encounter Medications as of 11/08/2020  Medication Sig   Cholecalciferol 50 MCG (2000 UT) CAPS Take 1 capsule (2,000 Units total) by mouth daily with breakfast.   acetaminophen (TYLENOL) 500 MG tablet Take 500 mg by mouth every 6 (six) hours as needed. Pain.   atorvastatin (LIPITOR) 10 MG tablet TAKE ONE TABLET BY MOUTH DAILY AT 6 PM   Blood Glucose Monitoring Suppl (ACCU-CHEK GUIDE) w/Device KIT 1 Piece by Does not apply route as directed.   GE100 BLOOD GLUCOSE TEST test strip USE AS DIRECTED TO TEST TWICE DAILY   glipiZIDE (GLUCOTROL XL) 5 MG 24 hr tablet TAKE ONE TABLET BY MOUTH DAILY WITH BREAKFAST   Lancets MISC 1 each by  Does not apply route 4 (four) times daily. Use as directed to check blood glucose four times daily   losartan-hydrochlorothiazide (HYZAAR) 100-25 MG tablet TAKE ONE TABLET BY MOUTH ONCE DAILY   metFORMIN (GLUCOPHAGE) 1000 MG tablet TAKE ONE TABLET BY MOUTH TWICE DAILY WITH A MEAL   omeprazole (PRILOSEC) 20 MG capsule Take 1 capsule (20 mg total) by mouth daily as needed. Heartburn   silver sulfADIAZINE (SILVADENE) 1 % cream Apply 1 application topically 2 (two) times daily.   No facility-administered encounter medications on file as of 11/08/2020.    ALLERGIES: No Known Allergies  VACCINATION STATUS: Immunization History  Administered Date(s) Administered   Moderna SARS-COV2 Booster Vaccination 06/09/2020   Pneumococcal Polysaccharide-23 11/03/2010, 05/27/2013, 06/23/2019   Tdap 08/12/2019    Diabetes She presents for her follow-up diabetic visit. She has type 2 diabetes mellitus. Onset time: She was diagnosed at approximate age of 61 years. Her disease course has been improving. There are no hypoglycemic associated symptoms. Pertinent negatives for hypoglycemia include no confusion, headaches, pallor or seizures. Associated symptoms include blurred vision and fatigue. Pertinent negatives for diabetes include no chest pain, no polydipsia, no polyphagia and no polyuria. There are no hypoglycemic complications. Symptoms are improving. There are no diabetic complications. Risk factors for coronary artery disease include diabetes mellitus, dyslipidemia, hypertension, family history, tobacco exposure, sedentary lifestyle and post-menopausal. Current diabetic treatment includes oral agent (monotherapy). Her weight is fluctuating minimally. She is following a generally unhealthy diet. When asked about meal planning, she reported none. She has not had a previous visit with a dietitian. She rarely (She is wheelchair-bound due to deconditioning/disequilibrium.  ) participates in exercise. Her home blood  glucose trend is fluctuating minimally. Her breakfast blood glucose range is generally 140-180 mg/dl. Her bedtime blood glucose range is generally 180-200 mg/dl. Her overall blood glucose range is 180-200 mg/dl. (She presents with continued improvement in her glycemic profile both fasting and postprandial.  Her point-of-care A1c is 7.2%, overall improving from 11.5%.  She did not document or report any major hypoglycemia.     ) An ACE inhibitor/angiotensin II receptor blocker is being taken. Eye exam is current.  Hyperlipidemia This  is a chronic problem. The current episode started more than 1 year ago. Exacerbating diseases include diabetes. Pertinent negatives include no chest pain, myalgias or shortness of breath. Current antihyperlipidemic treatment includes statins. Risk factors for coronary artery disease include diabetes mellitus, hypertension, dyslipidemia, family history, post-menopausal and a sedentary lifestyle.  Hypertension This is a chronic problem. The current episode started more than 1 year ago. Associated symptoms include blurred vision. Pertinent negatives include no chest pain, headaches, palpitations or shortness of breath. Risk factors for coronary artery disease include diabetes mellitus, dyslipidemia, family history, post-menopausal state, smoking/tobacco exposure and sedentary lifestyle.    Review of Systems  Constitutional:  Positive for fatigue. Negative for chills, fever and unexpected weight change.  HENT:  Negative for trouble swallowing and voice change.   Eyes:  Positive for blurred vision. Negative for visual disturbance.  Respiratory:  Negative for cough, shortness of breath and wheezing.   Cardiovascular:  Negative for chest pain, palpitations and leg swelling.  Gastrointestinal:  Negative for diarrhea, nausea and vomiting.  Endocrine: Negative for cold intolerance, heat intolerance, polydipsia, polyphagia and polyuria.  Musculoskeletal:  Positive for gait  problem. Negative for arthralgias and myalgias.       She is wheelchair-bound due to deconditioning/disequilibrium.  Skin:  Negative for color change, pallor, rash and wound.  Neurological:  Negative for seizures and headaches.  Psychiatric/Behavioral:  Negative for confusion and suicidal ideas.    Objective:    Vitals with BMI 11/08/2020 07/06/2020 06/01/2020  Height $Remov'5\' 6"'umiggz$  $Remove'5\' 6"'NFDEkkJ$  (No Data)  Weight 180 lbs 182 lbs 13 oz (No Data)  BMI 56.25 63.89 -  Systolic 373 428 (No Data)  Diastolic 82 82 (No Data)  Pulse 96 96 (No Data)    BP 123/82   Pulse 96   Ht $R'5\' 6"'ZG$  (1.676 m)   Wt 180 lb (81.6 kg)   BMI 29.05 kg/m   Wt Readings from Last 3 Encounters:  11/08/20 180 lb (81.6 kg)  07/06/20 182 lb 12.8 oz (82.9 kg)  03/08/20 176 lb 12.8 oz (80.2 kg)     Physical Exam Constitutional:      Appearance: She is well-developed.  HENT:     Head: Normocephalic and atraumatic.  Neck:     Thyroid: No thyromegaly.     Trachea: No tracheal deviation.  Cardiovascular:     Rate and Rhythm: Normal rate and regular rhythm.  Pulmonary:     Effort: Pulmonary effort is normal.     Breath sounds: Normal breath sounds.  Abdominal:     Tenderness: There is no abdominal tenderness. There is no guarding.  Musculoskeletal:     Cervical back: Normal range of motion and neck supple.     Comments: She is wheelchair-bound.  Skin:    General: Skin is warm and dry.     Coloration: Skin is not pale.     Findings: No erythema or rash.  Neurological:     Mental Status: She is alert and oriented to person, place, and time.     Cranial Nerves: No cranial nerve deficit.     Coordination: Coordination normal.     Deep Tendon Reflexes: Reflexes are normal and symmetric.  Psychiatric:        Judgment: Judgment normal.   CMP ( most recent) CMP     Component Value Date/Time   NA 145 (H) 11/02/2020 0911   K 4.8 11/02/2020 0911   CL 103 11/02/2020 0911   CO2 24 11/02/2020 0911   GLUCOSE  146 (H) 11/02/2020  0911   GLUCOSE 250 (H) 03/26/2019 1517   BUN 22 11/02/2020 0911   CREATININE 1.08 (H) 11/02/2020 0911   CREATININE 0.93 03/26/2019 1517   CALCIUM 9.5 11/02/2020 0911   PROT 6.8 11/02/2020 0911   ALBUMIN 4.1 11/02/2020 0911   AST 15 11/02/2020 0911   ALT 20 11/02/2020 0911   ALKPHOS 147 (H) 11/02/2020 0911   BILITOT <0.2 11/02/2020 0911   GFRNONAA 59 (L) 12/02/2019 1203   GFRNONAA 65 03/26/2019 1517   GFRAA 68 12/02/2019 1203   GFRAA 75 03/26/2019 1517    Diabetic Labs (most recent): Lab Results  Component Value Date   HGBA1C 7.2 (A) 11/08/2020   HGBA1C 6.9 07/06/2020   HGBA1C 7.2 (A) 03/08/2020    Lipid Panel     Component Value Date/Time   CHOL 135 11/02/2020 0911   TRIG 124 11/02/2020 0911   HDL 41 11/02/2020 0911   CHOLHDL 3.3 11/02/2020 0911   CHOLHDL 4.3 03/26/2019 1517   LDLCALC 72 11/02/2020 0911   LDLCALC 122 (H) 03/26/2019 1517   LABVLDL 22 11/02/2020 0911     Assessment & Plan:   1. Uncontrolled type 2 diabetes mellitus with hyperglycemia ([place])  - Kim [name] has currently uncontrolled symptomatic type 2 DM since  65 years of age.   She presents her log which shows significantly improved glycemic profile to near target levels.    She presents with continued improvement in her glycemic profile both fasting and postprandial.  Her point-of-care A1c is 7.2%, overall improving from 11.5%.  She did not document or report any major hypoglycemia.    Recent labs reviewed. - I had a long discussion with her about the progressive nature of diabetes and the pathology behind its complications. -her diabetes is complicated by sedentary life, smoking and she remains at a high risk for more acute and chronic complications which include CAD, CVA, CKD, retinopathy, and neuropathy. These are all discussed in detail with her.  - I have counseled her on diet  and weight management  by adopting a carbohydrate restricted/protein rich diet. Patient is encouraged to switch  to  unprocessed or minimally processed     complex starch and increased protein intake (animal or plant source), fruits, and vegetables. -  she is advised to stick to a routine mealtimes to eat 3 meals  a day and avoid unnecessary snacks ( to snack only to correct hypoglycemia).   - she acknowledges that there is a room for improvement in her food and drink choices. - Suggestion is made for her to avoid simple carbohydrates  from her diet including Cakes, Sweet Desserts, Ice Cream, Soda (diet and regular), Sweet Tea, Candies, Chips, Cookies, Store Bought Juices, Alcohol in Excess of  1-2 drinks a day, Artificial Sweeteners,  Coffee Creamer, and "Sugar-free" Products, Lemonade. This will help patient to have more stable blood glucose profile and potentially avoid unintended weight gain.  - I have approached her with the following individualized plan to manage  her diabetes and patient agrees:   -Based on her presentation with near target glycemic profile, she will not need insulin treatment for now.   She wishes to avoid insulin treatment for now.  She is willing to continue to monitor blood glucose twice a day-daily before breakfast and at bedtime.     - she is encouraged to call clinic for blood glucose levels less than 70 or greater than 200 mg/Dl at fasting.  - she is advised  to continue metformin 1000 mg p.o. twice daily-daily after breakfast and after supper.  She is also benefiting from glipizide.  She is advised to continue glipizide 5 mg XL p.o. daily after breakfast.      - she is not a candidate for SGLT2 inhibitors nor incretin therapy.   - Specific targets for  A1c;  LDL, HDL,  and Triglycerides were discussed with the patient.  2) Blood Pressure /Hypertension: Her blood pressure is controlled to target.  she is advised to continue her current medications including losartan/HCTZ 100/25 mg p.o. daily with breakfast .  3) Lipids/Hyperlipidemia:   Review of her recent lipid panel  showed improved LDL to 93 from 122.  However, she is on atorvastatin.  She is advised to continue atorvastatin 10 mg p.o. daily at bedtime.     Side effects and precautions discussed with her.  4)  Weight/Diet:  Body mass index is 29.05 kg/m.  -She is a candidate for some  weight loss.   I discussed with her the fact that loss of 5 - 10% of her  current body weight will have the most impact on her diabetes management.  Exercise, and detailed carbohydrates information provided  -  detailed on discharge instructions.  5) Chronic Care/Health Maintenance:  -she  is on ACEI/ARB and Statin medications and  is encouraged to initiate and continue to follow up with Ophthalmology, Dentist,  Podiatrist at least yearly or according to recommendations, and advised to  Quit smoking. I have recommended yearly flu vaccine and pneumonia vaccine at least every 5 years;  and  sleep for at least 7 hours a day.  She is wheelchair-bound, cannot exercise optimally.   6) subclinical hypothyroidism Her previsit labs show subclinical hypothyroidism.  I did not initiate thyroid hormone replacement at this time.  She will have repeat full profile thyroid functions before her next visit in 3 months.  - she is  advised to maintain close follow up with [name], NP for primary care needs, as well as her other providers for optimal and coordinated care.   I spent 35 minutes in the care of the patient today including review of labs from [place], Lipids, Thyroid Function, Hematology (current and previous including abstractions from other facilities); face-to-face time discussing  her blood glucose readings/logs, discussing hypoglycemia and hyperglycemia episodes and symptoms, medications doses, her options of short and long term treatment based on the latest standards of care / guidelines;  discussion about incorporating lifestyle medicine;  and documenting the encounter.    Please refer to Patient Instructions for Blood  Glucose Monitoring and Insulin/Medications Dosing Guide"  in media tab for additional information. Please  also refer to " Patient Self Inventory" in the Media  tab for reviewed elements of pertinent patient history.  [name] participated in the discussions, expressed understanding, and voiced agreement with the above plans.  All questions were answered to her satisfaction. she is encouraged to contact clinic should she have any questions or concerns prior to her return visit.     Follow up plan: - Return in about 3 months (around 02/07/2021) for F/U with Pre-visit Labs, Meter, Logs, A1c here.Glade Lloyd, MD [facility] [address] [place], [place] [address] Phone: [phone]  Fax: [phone]    11/08/2020, 3:29 PM  This note was partially dictated with voice recognition software. Similar sounding words can be transcribed inadequately or may not  be corrected upon review.

## 2020-11-29 ENCOUNTER — Other Ambulatory Visit: Payer: Self-pay | Admitting: "Endocrinology

## 2020-11-29 DIAGNOSIS — E1165 Type 2 diabetes mellitus with hyperglycemia: Secondary | ICD-10-CM

## 2020-12-06 DIAGNOSIS — Z23 Encounter for immunization: Secondary | ICD-10-CM | POA: Diagnosis not present

## 2021-01-27 ENCOUNTER — Other Ambulatory Visit: Payer: Self-pay

## 2021-01-27 ENCOUNTER — Ambulatory Visit (INDEPENDENT_AMBULATORY_CARE_PROVIDER_SITE_OTHER): Payer: Medicare Other | Admitting: Nurse Practitioner

## 2021-01-27 ENCOUNTER — Encounter: Payer: Self-pay | Admitting: Nurse Practitioner

## 2021-01-27 VITALS — BP 137/83 | HR 72 | Ht 67.0 in | Wt 183.1 lb

## 2021-01-27 DIAGNOSIS — E782 Mixed hyperlipidemia: Secondary | ICD-10-CM | POA: Diagnosis not present

## 2021-01-27 DIAGNOSIS — I10 Essential (primary) hypertension: Secondary | ICD-10-CM

## 2021-01-27 DIAGNOSIS — L97309 Non-pressure chronic ulcer of unspecified ankle with unspecified severity: Secondary | ICD-10-CM

## 2021-01-27 DIAGNOSIS — E1165 Type 2 diabetes mellitus with hyperglycemia: Secondary | ICD-10-CM | POA: Diagnosis not present

## 2021-01-27 DIAGNOSIS — E11622 Type 2 diabetes mellitus with other skin ulcer: Secondary | ICD-10-CM

## 2021-01-27 MED ORDER — SULFAMETHOXAZOLE-TRIMETHOPRIM 800-160 MG PO TABS
1.0000 | ORAL_TABLET | Freq: Two times a day (BID) | ORAL | 0 refills | Status: DC
Start: 2021-01-27 — End: 2021-02-22

## 2021-01-27 NOTE — Progress Notes (Signed)
° °  LINDEY RENZULLI     MRN: [id number]      DOB: 26-Feb-1955   HPI Ms. Bas is here for c/o left ankle wound that  '' started as a carpet burn in September this year and the wound has gotten worse over time. She re injured the wound before Halloween. She has never had this type of wound. She has been putting silverdem cream  that she has at home. 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  ROS Denies recent fever or chills. Denies sinus pressure, nasal congestion, ear pain or sore throat. Denies chest congestion, productive cough or wheezing. Denies chest pains, palpitations and leg swelling Denies abdominal pain, nausea, vomiting,diarrhea or constipation.   Denies dysuria, frequency, hesitancy or incontinence. Denies joint pain, swelling and limitation in mobility. Denies headaches, seizures, numbness, or tingling. Denies depression, anxiety or insomnia. Has left ankle wound with swelling and redness   PE  BP 137/83    Pulse 72    Ht 5\' 7"  (1.702 m)    Wt 183 lb 1.9 oz (83.1 kg)    SpO2 94%    BMI 28.68 kg/m   Patient alert and oriented and in no cardiopulmonary distress.  HEENT: No facial asymmetry, EOMI,     Neck supple .  Chest: Clear to auscultation bilaterally.  CVS: S1, S2 no murmurs, no S3.Regular rate.  ABD: Soft non tender.   Ext: No edema  MS: Adequate ROM spine, shoulders, hips and knees.  Skin: left ankle ulcer,ulcer measures 2cm x2cm, ulcer is black in color and has small amount of purulent drainage.  Foot and ankle is swollen  and red, pedal pulse was palpable, foot feels warm to touch, no tenderness, sensation is intact.  Psych: Good eye contact, normal affect. Memory intact not anxious or depressed appearing.  CNS: CN 2-12 intact, power,  normal throughout.no focal deficits noted.   Assessment & Plan  Pt educated on the need to get her DEXA scan, mammogram and eye exam done.

## 2021-01-27 NOTE — Assessment & Plan Note (Signed)
Well controled , continue current meds.  BP Readings from Last 3 Encounters:  01/27/21 137/83  11/08/20 123/82  07/06/20 138/82

## 2021-01-27 NOTE — Assessment & Plan Note (Signed)
Left ankle diabetic wound. Bactrim DS BID for 7 days.  referral to podiatry.  daibetic Foot exam done today.

## 2021-01-27 NOTE — Assessment & Plan Note (Signed)
Lab Results  Component Value Date   CHOL 135 11/02/2020   HDL 41 11/02/2020   LDLCALC 72 11/02/2020   TRIG 124 11/02/2020   CHOLHDL 3.3 11/02/2020  continue atorvastatin 10mg  daily.

## 2021-01-27 NOTE — Patient Instructions (Addendum)
Take bactrim one tablet two times daily for 7 days for your ankle wound.  You have been referred to podiatry for your ankle wound  Get your bone density scan, mammogram  and diabetic eye exam done.   It is important that you exercise regularly at least 30 minutes 5 times a week.  Think about what you will eat, plan ahead. Choose " clean, green, fresh or frozen" over canned, processed or packaged foods which are more sugary, salty and fatty. 70 to 75% of food eaten should be vegetables and fruit. Three meals at set times with snacks allowed between meals, but they must be fruit or vegetables. Aim to eat over a 12 hour period , example 7 am to 7 pm, and STOP after  your last meal of the day. Drink water,generally about 64 ounces per day, no other drink is as healthy. Fruit juice is best enjoyed in a healthy way, by EATING the fruit.  Thanks for choosing [facility], we consider it a privelige to serve you.

## 2021-01-27 NOTE — Assessment & Plan Note (Signed)
Lab Results  Component Value Date   HGBA1C 7.2 (A) 11/08/2020  followed by Dr Dorris Fetch. diabetic foot exam done today. Diabetic eye exam ordered today.

## 2021-01-30 ENCOUNTER — Other Ambulatory Visit: Payer: Self-pay | Admitting: Podiatry

## 2021-01-30 ENCOUNTER — Other Ambulatory Visit: Payer: Self-pay

## 2021-01-30 ENCOUNTER — Telehealth: Payer: Self-pay | Admitting: *Deleted

## 2021-01-30 ENCOUNTER — Ambulatory Visit (INDEPENDENT_AMBULATORY_CARE_PROVIDER_SITE_OTHER): Payer: Medicare Other

## 2021-01-30 ENCOUNTER — Ambulatory Visit (INDEPENDENT_AMBULATORY_CARE_PROVIDER_SITE_OTHER): Payer: Medicare Other | Admitting: Podiatry

## 2021-01-30 DIAGNOSIS — M86172 Other acute osteomyelitis, left ankle and foot: Secondary | ICD-10-CM | POA: Diagnosis not present

## 2021-01-30 DIAGNOSIS — L97302 Non-pressure chronic ulcer of unspecified ankle with fat layer exposed: Secondary | ICD-10-CM

## 2021-01-30 DIAGNOSIS — L97322 Non-pressure chronic ulcer of left ankle with fat layer exposed: Secondary | ICD-10-CM

## 2021-01-30 DIAGNOSIS — L97321 Non-pressure chronic ulcer of left ankle limited to breakdown of skin: Secondary | ICD-10-CM

## 2021-01-30 MED ORDER — SANTYL 250 UNIT/GM EX OINT: 1.0000 "application " | TOPICAL_OINTMENT | Freq: Every day | CUTANEOUS | 2 refills | Status: AC

## 2021-01-30 NOTE — Telephone Encounter (Signed)
Hanna with [facility] is calling for the size of wound that they are treating in order to  fill the Santyl ointment prescription. Please call @ [phone]

## 2021-01-31 NOTE — Telephone Encounter (Signed)
Returned the call to pharmacist(Hanna) giving measurements per Dr Jacqualyn Posey, verbalized understanding.

## 2021-02-01 ENCOUNTER — Other Ambulatory Visit: Payer: Self-pay

## 2021-02-01 ENCOUNTER — Ambulatory Visit (INDEPENDENT_AMBULATORY_CARE_PROVIDER_SITE_OTHER): Payer: Medicare Other

## 2021-02-01 DIAGNOSIS — M86172 Other acute osteomyelitis, left ankle and foot: Secondary | ICD-10-CM | POA: Diagnosis not present

## 2021-02-01 DIAGNOSIS — E038 Other specified hypothyroidism: Secondary | ICD-10-CM | POA: Diagnosis not present

## 2021-02-02 LAB — BASIC METABOLIC PANEL
BUN/Creatinine Ratio: 14 (ref 12–28)
BUN: 23 mg/dL (ref 8–27)
CO2: 25 mmol/L (ref 20–29)
Calcium: 9.2 mg/dL (ref 8.7–10.3)
Chloride: 99 mmol/L (ref 96–106)
Creatinine, Ser: 1.62 mg/dL — ABNORMAL HIGH (ref 0.57–1.00)
Glucose: 214 mg/dL — ABNORMAL HIGH (ref 70–99)
Potassium: 4.9 mmol/L (ref 3.5–5.2)
Sodium: 141 mmol/L (ref 134–144)
eGFR: 35 mL/min/{1.73_m2} — ABNORMAL LOW (ref >59)

## 2021-02-02 LAB — CBC WITH DIFFERENTIAL/PLATELET
Basophils Absolute: 0 10*3/uL (ref 0.0–0.2)
Basos: 0 %
EOS (ABSOLUTE): 0.2 10*3/uL (ref 0.0–0.4)
Eos: 3 %
Hematocrit: 42.1 % (ref 34.0–46.6)
Hemoglobin: 14.1 g/dL (ref 11.1–15.9)
Immature Grans (Abs): 0 10*3/uL (ref 0.0–0.1)
Immature Granulocytes: 0 %
Lymphocytes Absolute: 1.1 10*3/uL (ref 0.7–3.1)
Lymphs: 15 %
MCH: 29.1 pg (ref 26.6–33.0)
MCHC: 33.5 g/dL (ref 31.5–35.7)
MCV: 87 fL (ref 79–97)
Monocytes Absolute: 0.4 10*3/uL (ref 0.1–0.9)
Monocytes: 6 %
Neutrophils Absolute: 5.5 10*3/uL (ref 1.4–7.0)
Neutrophils: 76 %
Platelets: 439 10*3/uL (ref 150–450)
RBC: 4.84 x10E6/uL (ref 3.77–5.28)
RDW: 12.2 % (ref 11.7–15.4)
WBC: 7.3 10*3/uL (ref 3.4–10.8)

## 2021-02-02 LAB — SEDIMENTATION RATE: Sed Rate: 24 mm/hr (ref 0–40)

## 2021-02-02 LAB — THYROGLOBULIN ANTIBODY: Thyroglobulin Antibody: 83.4 IU/mL — ABNORMAL HIGH (ref 0.0–0.9)

## 2021-02-02 LAB — T4, FREE: Free T4: 1.12 ng/dL (ref 0.82–1.77)

## 2021-02-02 LAB — TSH: TSH: 2.96 u[IU]/mL (ref 0.450–4.500)

## 2021-02-02 LAB — C-REACTIVE PROTEIN: CRP: 6 mg/L (ref 0–10)

## 2021-02-02 LAB — THYROID PEROXIDASE ANTIBODY: Thyroperoxidase Ab SerPl-aCnc: 108 IU/mL — ABNORMAL HIGH (ref 0–34)

## 2021-02-02 NOTE — Progress Notes (Signed)
Subjective:   Patient ID: Kim [name], female   DOB: 65 y.o.   MRN: [id number]   HPI 65 year old female presents the office today for concerns of ulceration lateral aspect left ankle which is been on about 4 months.  She states it started off as a carpet burn.  She states that it has been getting worse.  She is been applying Silvadene to open.  She does not bloody drainage.  She has not noticed any redness and no odor that she reports.  She denies any fevers or chills currently.  She is diabetic and last A1c was 7.2 last blood sugar was 103.   Review of Systems  All other systems reviewed and are negative.  Past Medical History:  Diagnosis Date   Cancer [facility])    history of leukemia   Community acquired pneumonia 05/26/2013   Diabetes mellitus without complication ([place])    Encounter for screening mammogram for malignant neoplasm of breast 03/26/2019   Hypercholesteremia    Hypertension    Neuropathy    Seasonal allergies     Past Surgical History:  Procedure Laterality Date   COLONOSCOPY  12/28/2011   Procedure: COLONOSCOPY;  Surgeon: Danie Binder, MD;  Location[facility];  Service: Endoscopy;  Laterality: N/A;  9:30 AM-changed to 8:30 [name] to notify pt   LAPAROSCOPY     TUBAL LIGATION     TUNNELED VENOUS CATHETER PLACEMENT       Current Outpatient Medications:    collagenase (SANTYL) ointment, Apply 1 application topically daily., Disp: 30 g, Rfl: 2   acetaminophen (TYLENOL) 500 MG tablet, Take 500 mg by mouth every 6 (six) hours as needed. Pain., Disp: , Rfl:    atorvastatin (LIPITOR) 10 MG tablet, TAKE ONE TABLET BY MOUTH DAILY AT 6 PM, Disp: 90 tablet, Rfl: 0   Blood Glucose Monitoring Suppl (ACCU-CHEK GUIDE) w/Device KIT, 1 Piece by Does not apply route as directed., Disp: 1 kit, Rfl: 0   Cholecalciferol 50 MCG (2000 UT) CAPS, Take 1 capsule (2,000 Units total) by mouth daily with breakfast., Disp: 90 capsule, Rfl: 1   GE100 BLOOD GLUCOSE TEST test strip, USE AS  DIRECTED TO TEST TWICE DAILY, Disp: 150 strip, Rfl: 2   glipiZIDE (GLUCOTROL XL) 5 MG 24 hr tablet, TAKE ONE TABLET BY MOUTH DAILY WITH BREAKFAST, Disp: 90 tablet, Rfl: 1   Lancets MISC, 1 each by Does not apply route 4 (four) times daily. Use as directed to check blood glucose four times daily, Disp: 200 each, Rfl: 1   losartan-hydrochlorothiazide (HYZAAR) 100-25 MG tablet, TAKE ONE TABLET BY MOUTH ONCE DAILY, Disp: 90 tablet, Rfl: 1   metFORMIN (GLUCOPHAGE) 1000 MG tablet, TAKE ONE TABLET BY MOUTH TWICE DAILY WITH A MEAL, Disp: 180 tablet, Rfl: 0   omeprazole (PRILOSEC) 20 MG capsule, Take 1 capsule (20 mg total) by mouth daily as needed. Heartburn, Disp: 30 capsule, Rfl: 3   silver sulfADIAZINE (SILVADENE) 1 % cream, Apply 1 application topically 2 (two) times daily., Disp: , Rfl:    sulfamethoxazole-trimethoprim (BACTRIM DS) 800-160 MG tablet, Take 1 tablet by mouth 2 (two) times daily., Disp: 14 tablet, Rfl: 0  No Known Allergies        Objective:  Physical Exam  General: AAO x3, NAD  Dermatological: Ulceration noted to the lateral malleolus with callus, eschar overlying the wound.  On debridement there is a fibrotic wound base.  After debridement the wound measures 2 x 1.5 x 0.3 cm there is  no exposed bone or tendon.  Prior to wound debridement was smaller given the overlying callus formation.  No significant surrounding erythema and there is no ascending cellulitis.  There is no fluctuation or crepitation.     Vascular: Dorsalis Pedis artery and Posterior Tibial artery pedal pulses are palpable bilateral with immedate capillary fill time. There is no pain with calf compression, swelling, warmth, erythema.   Neruologic: Sensation decreased with Semmes Weinstein monofilament  Musculoskeletal: No significant tenderness to the ankle ulcer muscular strength 5/5 in all groups tested bilateral.  Gait: Unassisted, Nonantalgic.       Assessment:   Left chronic ankle wound      Plan:  -Treatment options discussed including all alternatives, risks, and complications -Etiology of symptoms were discussed -X-rays were obtained and reviewed with the patient.  There is questional cortical changes of the lateral malleolus concerning for possible osteomyelitis. -Given x-ray findings I ordered an MRI of the ankle -ABI  -Blood work: CBC, sed rate, CRP, BMP -Sharply debrided the wound utilizing #312 with scalpel to help debride any nonviable devitalized tissue to help promote wound healing.  I debrided the overlying callus, eschar to reveal underlying fibrotic tissue.  There is no purulence or signs of an abscess.  She tolerated procedure well and there is no blood loss.  I cleansed the wound with wound cleanser and Silvadene was applied followed by dressing.  I ordered Santyl to be applied to the wound daily as well. -Surgical shoe dispensed for offloading -Monitor for any clinical signs or symptoms of infection and directed to call the office immediately should any occur or go to the ER.  Return in about 2 weeks (around 02/13/2021) for ulcer.  Trula Slade DPM

## 2021-02-03 ENCOUNTER — Other Ambulatory Visit: Payer: Self-pay | Admitting: Podiatry

## 2021-02-03 DIAGNOSIS — N289 Disorder of kidney and ureter, unspecified: Secondary | ICD-10-CM

## 2021-02-03 DIAGNOSIS — I739 Peripheral vascular disease, unspecified: Secondary | ICD-10-CM

## 2021-02-03 DIAGNOSIS — L97302 Non-pressure chronic ulcer of unspecified ankle with fat layer exposed: Secondary | ICD-10-CM

## 2021-02-06 DIAGNOSIS — L299 Pruritus, unspecified: Secondary | ICD-10-CM | POA: Diagnosis not present

## 2021-02-07 ENCOUNTER — Emergency Department (HOSPITAL_COMMUNITY)
Admission: EM | Admit: 2021-02-07 | Discharge: 2021-02-07 | Disposition: A | Discharge status: Home / Self Care | Payer: Medicare Other | Attending: Emergency Medicine | Admitting: Emergency Medicine

## 2021-02-07 ENCOUNTER — Emergency Department (HOSPITAL_COMMUNITY): Payer: Medicare Other

## 2021-02-07 ENCOUNTER — Other Ambulatory Visit: Payer: Self-pay

## 2021-02-07 DIAGNOSIS — R21 Rash and other nonspecific skin eruption: Secondary | ICD-10-CM | POA: Diagnosis not present

## 2021-02-07 DIAGNOSIS — I10 Essential (primary) hypertension: Secondary | ICD-10-CM | POA: Diagnosis not present

## 2021-02-07 DIAGNOSIS — E1140 Type 2 diabetes mellitus with diabetic neuropathy, unspecified: Secondary | ICD-10-CM | POA: Insufficient documentation

## 2021-02-07 DIAGNOSIS — Z79899 Other long term (current) drug therapy: Secondary | ICD-10-CM | POA: Insufficient documentation

## 2021-02-07 DIAGNOSIS — Z7984 Long term (current) use of oral hypoglycemic drugs: Secondary | ICD-10-CM | POA: Diagnosis not present

## 2021-02-07 DIAGNOSIS — L299 Pruritus, unspecified: Secondary | ICD-10-CM | POA: Insufficient documentation

## 2021-02-07 DIAGNOSIS — M7989 Other specified soft tissue disorders: Secondary | ICD-10-CM | POA: Diagnosis not present

## 2021-02-07 DIAGNOSIS — F17210 Nicotine dependence, cigarettes, uncomplicated: Secondary | ICD-10-CM | POA: Diagnosis not present

## 2021-02-07 DIAGNOSIS — J449 Chronic obstructive pulmonary disease, unspecified: Secondary | ICD-10-CM | POA: Diagnosis not present

## 2021-02-07 LAB — CBC WITH DIFFERENTIAL/PLATELET
Abs Immature Granulocytes: 0.08 10*3/uL — ABNORMAL HIGH (ref 0.00–0.07)
Basophils Absolute: 0.1 10*3/uL (ref 0.0–0.1)
Basophils Relative: 1 %
Eosinophils Absolute: 0.7 10*3/uL — ABNORMAL HIGH (ref 0.0–0.5)
Eosinophils Relative: 6 %
HCT: 44.4 % (ref 36.0–46.0)
Hemoglobin: 14.4 g/dL (ref 12.0–15.0)
Immature Granulocytes: 1 %
Lymphocytes Relative: 29 %
Lymphs Abs: 3.3 10*3/uL (ref 0.7–4.0)
MCH: 30.1 pg (ref 26.0–34.0)
MCHC: 32.4 g/dL (ref 30.0–36.0)
MCV: 92.7 fL (ref 80.0–100.0)
Monocytes Absolute: 0.5 10*3/uL (ref 0.1–1.0)
Monocytes Relative: 4 %
Neutro Abs: 6.9 10*3/uL (ref 1.7–7.7)
Neutrophils Relative %: 59 %
Platelets: 461 10*3/uL — ABNORMAL HIGH (ref 150–400)
RBC: 4.79 MIL/uL (ref 3.87–5.11)
RDW: 13.1 % (ref 11.5–15.5)
WBC: 11.4 10*3/uL — ABNORMAL HIGH (ref 4.0–10.5)
nRBC: 0 % (ref 0.0–0.2)

## 2021-02-07 LAB — BASIC METABOLIC PANEL
Anion gap: 12 (ref 5–15)
BUN: 43 mg/dL — ABNORMAL HIGH (ref 8–23)
CO2: 22 mmol/L (ref 22–32)
Calcium: 8.6 mg/dL — ABNORMAL LOW (ref 8.9–10.3)
Chloride: 99 mmol/L (ref 98–111)
Creatinine, Ser: 1.66 mg/dL — ABNORMAL HIGH (ref 0.44–1.00)
GFR, Estimated: 34 mL/min — ABNORMAL LOW (ref >60)
Glucose, Bld: 129 mg/dL — ABNORMAL HIGH (ref 70–99)
Potassium: 4.6 mmol/L (ref 3.5–5.1)
Sodium: 133 mmol/L — ABNORMAL LOW (ref 135–145)

## 2021-02-07 LAB — WOUND CULTURE
MICRO NUMBER:: 12774476
SPECIMEN QUALITY:: ADEQUATE

## 2021-02-07 LAB — HOUSE ACCOUNT TRACKING

## 2021-02-07 MED ORDER — PREDNISONE 20 MG PO TABS: 40.0000 mg | ORAL_TABLET | Freq: Every day | ORAL | 0 refills | Status: DC

## 2021-02-07 MED ORDER — DIPHENHYDRAMINE HCL 25 MG PO CAPS
25.0000 mg | ORAL_CAPSULE | Freq: Once | ORAL | Status: AC
  Administered 2021-02-07: 18:00:00 25 mg via ORAL
  Filled 2021-02-07: qty 1

## 2021-02-07 NOTE — ED Triage Notes (Signed)
Pt arrives with c/o rash on most of her body. Rash is on legs, arms, and torso. Pt denies SOB, difficulty swallowing, or facial swelling. Per pt, she received a new cream for her foot.

## 2021-02-07 NOTE — ED Notes (Signed)
Patient provided with drink.

## 2021-02-07 NOTE — Discharge Instructions (Signed)
Take the prednisone as directed until its finished.  Continue taking Benadryl, 1 capsule every 6 hours for itching.  This may cause drowsiness.  Be careful when walking or standing.  Keep the wound clean and bandaged.  Follow-up with your podiatrist for recheck.  Return to the emergency department for any new or worsening symptoms.

## 2021-02-08 ENCOUNTER — Other Ambulatory Visit: Payer: Self-pay | Admitting: Podiatry

## 2021-02-08 MED ORDER — DOXYCYCLINE HYCLATE 100 MG PO TABS: 100.0000 mg | ORAL_TABLET | Freq: Two times a day (BID) | ORAL | 0 refills | Status: DC

## 2021-02-09 ENCOUNTER — Ambulatory Visit: Payer: Medicare Other

## 2021-02-09 ENCOUNTER — Other Ambulatory Visit: Payer: Self-pay

## 2021-02-09 LAB — HM DIABETES EYE EXAM

## 2021-02-10 NOTE — ED Provider Notes (Signed)
[facility] EMERGENCY DEPARTMENT Provider Note   CSN: [id number] Arrival date & time: 02/07/21  1441     History Chief Complaint  Patient presents with   Rash    Kim [name] is a 65 y.o. female.   Rash Associated symptoms: no abdominal pain, no fever, no joint pain, no myalgias, no nausea, no shortness of breath, not vomiting and not wheezing       Kim [name] is a 65 y.o. female who presents to the Emergency Department complaining of itching and rash all over.  Symptoms have been present for several days and not improving.  Notes recently completing a course of Bactrim for wound infection to her left ankle.  Noticed rash starting to her leg and has gradually spread to most of her body.  Tried taking benadryl with minimal relief.  Denies swelling of body, face, lips or tongue.  No shortness of breath. 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[place]) 01/27/2021   Annual visit for general adult medical examination with abnormal findings 06/23/2019   Hyperlipidemia associated with type 2 diabetes mellitus ([place]) 06/23/2019   Encounter for screening for malignant neoplasm of cervix 06/23/2019   Post-menopausal 06/23/2019   Encounter for screening for human immunodeficiency virus (HIV) 06/23/2019   Vitamin D deficiency 06/23/2019   Special screening for malignant neoplasms, colon 06/23/2019   Uncontrolled type 2 diabetes mellitus with hyperglycemia ([place]) 06/04/2019   Mixed hyperlipidemia 06/04/2019   Encounter for screening for malignant neoplasm of colon 03/26/2019   Encounter for hepatitis C  screening test for low risk patient 03/26/2019   COPD exacerbation ([place]) 05/26/2013   Essential hypertension, benign 05/26/2013   Diabetes ([place]) 05/26/2013    Past Surgical History:  Procedure Laterality Date   COLONOSCOPY  12/28/2011   Procedure: COLONOSCOPY;  Surgeon: Danie Binder, MD;  Location[facility];  Service: Endoscopy;  Laterality: N/A;  9:30 AM-changed to 8:30 [name] to notify pt   LAPAROSCOPY     TUBAL LIGATION     TUNNELED VENOUS CATHETER PLACEMENT       OB History   No obstetric history on file.     Family History  Problem Relation Age of Onset   Hypothyroidism Mother    Diabetes Mother    Hypertension Mother    Thyroid disease Mother    Hypertension Father    Stroke Father    Colon cancer Neg Hx     Social History   Tobacco Use   Smoking status: Every Day    Packs/day: 0.50    Years: 40.00    Pack years: 20.00    Types: Cigarettes   Smokeless tobacco: Never  Substance Use Topics   Alcohol use: No   Drug use: No    Home Medications Prior to Admission medications   Medication Sig Start Date End Date Taking? Authorizing Provider  predniSONE (DELTASONE) 20 MG tablet Take 2 tablets (40 mg total) by mouth daily. 02/07/21  Yes Farhad Burleson, PA-C  acetaminophen (TYLENOL) 500 MG tablet Take 500 mg by mouth every 6 (  six) hours as needed. Pain.    [provider]  atorvastatin (LIPITOR) 10 MG tablet TAKE ONE TABLET BY MOUTH DAILY AT 6 PM 08/31/20   Brita Romp, NP  Blood Glucose Monitoring Suppl (ACCU-CHEK GUIDE) w/Device KIT 1 Piece by Does not apply route as directed. 06/04/19   Cassandria Anger, MD  Cholecalciferol 50 MCG (2000 UT) CAPS Take 1 capsule (2,000 Units total) by mouth daily with breakfast. 11/08/20   Nida, Marella Chimes, MD  collagenase (SANTYL) ointment Apply 1 application topically daily. 01/30/21   Trula Slade, DPM  doxycycline (VIBRA-TABS) 100 MG tablet Take 1 tablet (100 mg total) by mouth 2 (two) times  daily. 02/08/21   Trula Slade, DPM  doxycycline (VIBRA-TABS) 100 MG tablet Take 1 tablet (100 mg total) by mouth 2 (two) times daily. 02/08/21   Trula Slade, DPM  GE100 BLOOD GLUCOSE TEST test strip USE AS DIRECTED TO TEST TWICE DAILY 07/12/20   Cassandria Anger, MD  glipiZIDE (GLUCOTROL XL) 5 MG 24 hr tablet TAKE ONE TABLET BY MOUTH DAILY WITH BREAKFAST 11/29/20   Cassandria Anger, MD  Lancets MISC 1 each by Does not apply route 4 (four) times daily. Use as directed to check blood glucose four times daily 06/05/19   Cassandria Anger, MD  losartan-hydrochlorothiazide [facility]) 100-25 MG tablet TAKE ONE TABLET BY MOUTH ONCE DAILY 08/29/20   Noreene Larsson, NP  metFORMIN (GLUCOPHAGE) 1000 MG tablet TAKE ONE TABLET BY MOUTH TWICE DAILY WITH A MEAL 11/29/20   Nida, Marella Chimes, MD  omeprazole (PRILOSEC) 20 MG capsule Take 1 capsule (20 mg total) by mouth daily as needed. Heartburn 05/05/20   [name], NP  silver sulfADIAZINE (SILVADENE) 1 % cream Apply 1 application topically 2 (two) times daily. 01/15/19   [provider]  sulfamethoxazole-trimethoprim (BACTRIM DS) 800-160 MG tablet Take 1 tablet by mouth 2 (two) times daily. 01/27/21   Renee Rival, FNP    Allergies    Patient has no known allergies.  Review of Systems   Review of Systems  Constitutional:  Negative for chills and fever.  HENT:  Negative for trouble swallowing.   Respiratory:  Negative for shortness of breath and wheezing.   Cardiovascular:  Negative for chest pain and palpitations.  Gastrointestinal:  Negative for abdominal pain, nausea and vomiting.  Genitourinary:  Negative for dysuria and flank pain.  Musculoskeletal:  Negative for arthralgias and myalgias.  Skin:  Positive for rash.       Ulceration to lateral left ankle  Neurological:  Negative for dizziness, weakness and numbness.  Hematological:  Does not bruise/bleed easily.  All other systems reviewed and are  negative.  Physical Exam Updated Vital Signs BP 119/80 (BP Location: Right Arm)    Pulse 94    Temp 98 F (36.7 C) (Oral)    Resp (!) 8    Ht 5' 7"  (1.702 m)    Wt 84.4 kg    SpO2 99%    BMI 29.13 kg/m   Physical Exam Vitals and nursing note reviewed.  Constitutional:      General: She is not in acute distress.    Appearance: Normal appearance. She is not ill-appearing or toxic-appearing.  HENT:     Head: Normocephalic.     Mouth/Throat:     Mouth: Mucous membranes are moist. No oral lesions or angioedema.     Pharynx: Oropharynx is clear. Uvula midline. No pharyngeal swelling or uvula swelling.  Eyes:     Pupils: Pupils are equal, round, and reactive to light.  Cardiovascular:     Rate and Rhythm: Normal rate and regular rhythm.     Pulses: Normal pulses.  Pulmonary:     Effort: Pulmonary effort is normal.     Breath sounds: Normal breath sounds. No wheezing.  Abdominal:     Palpations: Abdomen is soft.     Tenderness: There is no abdominal tenderness.  Musculoskeletal:        General: No swelling. Normal range of motion.     Cervical back: Normal range of motion.     Right lower leg: No edema.     Left lower leg: No edema.  Lymphadenopathy:     Cervical: No cervical adenopathy.  Skin:    General: Skin is warm and dry.     Capillary Refill: Capillary refill takes 2 to 3 seconds.     Findings: Erythema and rash present.     Comments: Maculopapular , erythematous rash to most of the body.  No edema of the body, face, lips or tongue.  Chronic appearing ulceration to the lateral left ankle.  Granulation tissue present, no drainage or edema.  No lymphangitis.  Some areas of excoriation to the forearms.   Neurological:     General: No focal deficit present.     Mental Status: She is alert.     Sensory: No sensory deficit.     Motor: No weakness.    ED Results / Procedures / Treatments   Labs (all labs ordered are listed, but only abnormal results are displayed) Labs  Reviewed  CBC WITH DIFFERENTIAL/PLATELET - Abnormal; Notable for the following components:      Result Value   WBC 11.4 (*)    Platelets 461 (*)    Eosinophils Absolute 0.7 (*)    Abs Immature Granulocytes 0.08 (*)    All other components within normal limits  BASIC METABOLIC PANEL - Abnormal; Notable for the following components:   Sodium 133 (*)    Glucose, Bld 129 (*)    BUN 43 (*)    Creatinine, Ser 1.66 (*)    Calcium 8.6 (*)    GFR, Estimated 34 (*)    All other components within normal limits    EKG None  Radiology No results found.  Procedures Procedures   Medications Ordered in ED Medications  diphenhydrAMINE (BENADRYL) capsule 25 mg (25 mg Oral Given 02/07/21 1804)    ED Course  I have reviewed the triage vital signs and the nursing notes.  Pertinent labs & imaging results that were available during my care of the patient were reviewed by me and considered in my medical decision making (see chart for details).    MDM Rules/Calculators/A&P                          Pt well appearing, non toxic.  Here for evaluation for itching and rash.  Symptoms began after finishing Bactrim.  Possible allergic reaction.  No concerning sx's for anaphylaxis, zoster, or angioedema.    Will treat with benadryl, steroids.  She has upcoming appt with podiatry.  No indication of systemic infection.  Pt agreeable to plan and return precautions discussed.       Final Clinical Impression(s) / ED Diagnoses Final diagnoses:  Rash    Rx / DC Orders ED Discharge Orders          Ordered    predniSONE (  DELTASONE) 20 MG tablet  Daily        02/07/21 1930             Bufford Lope 02/10/21 2325    Davonna Belling, MD 02/13/21 [phone]

## 2021-02-15 ENCOUNTER — Encounter: Payer: Medicare Other | Admitting: Vascular Surgery

## 2021-02-17 ENCOUNTER — Telehealth: Payer: Self-pay | Admitting: *Deleted

## 2021-02-17 NOTE — Telephone Encounter (Signed)
Patient notified to keep upcoming appointment

## 2021-02-17 NOTE — Telephone Encounter (Signed)
-----   Message from Trula Slade, DPM sent at 02/03/2021  4:38 PM EST ----- Referral to vascular surgery placed due to abnormal circulation test. Saryn Cherry- can you please let her know? Thanks.

## 2021-02-17 NOTE — Telephone Encounter (Signed)
Returned the call to patient, informed of referral to a vascular surgery,she verbalized understanding and said she has an appointment on [place] she keep this?

## 2021-02-20 ENCOUNTER — Other Ambulatory Visit: Payer: Self-pay

## 2021-02-20 ENCOUNTER — Ambulatory Visit (INDEPENDENT_AMBULATORY_CARE_PROVIDER_SITE_OTHER): Payer: Medicare Other | Admitting: Podiatry

## 2021-02-20 DIAGNOSIS — L97302 Non-pressure chronic ulcer of unspecified ankle with fat layer exposed: Secondary | ICD-10-CM | POA: Diagnosis not present

## 2021-02-22 ENCOUNTER — Encounter: Payer: Self-pay | Admitting: "Endocrinology

## 2021-02-22 ENCOUNTER — Ambulatory Visit (INDEPENDENT_AMBULATORY_CARE_PROVIDER_SITE_OTHER): Payer: Medicare Other | Admitting: "Endocrinology

## 2021-02-22 ENCOUNTER — Other Ambulatory Visit: Payer: Self-pay

## 2021-02-22 VITALS — BP 102/68 | HR 100 | Ht 67.0 in | Wt 178.4 lb

## 2021-02-22 DIAGNOSIS — I10 Essential (primary) hypertension: Secondary | ICD-10-CM

## 2021-02-22 DIAGNOSIS — E1165 Type 2 diabetes mellitus with hyperglycemia: Secondary | ICD-10-CM

## 2021-02-22 DIAGNOSIS — E038 Other specified hypothyroidism: Secondary | ICD-10-CM

## 2021-02-22 DIAGNOSIS — E782 Mixed hyperlipidemia: Secondary | ICD-10-CM | POA: Diagnosis not present

## 2021-02-22 LAB — POCT GLYCOSYLATED HEMOGLOBIN (HGB A1C): HbA1c, POC (controlled diabetic range): 7.3 % — AB (ref 0.0–7.0)

## 2021-02-22 MED ORDER — METFORMIN HCL 500 MG PO TABS: 500.0000 mg | ORAL_TABLET | Freq: Two times a day (BID) | ORAL | 1 refills | Status: DC

## 2021-02-22 NOTE — Progress Notes (Signed)
Subjective: 66 year old female presents the office today for follow-up evaluation of a wound on the lateral aspect left ankle.  She has been applying Santyl to the wound.  She finished course of antibiotics.  She states that is doing well.  Denies any swelling redness or drainage.  No fevers or chills that she reports.  Last A1c was 7.2 and has follow-up with endocrinology later this week. Schedule to see vascular surgery on January 18. Awaiting MRI.  Objective: AAO x3, NAD DP/PT pulses 1/4 bilaterally Protective sensation decreased with Semmes Weinstein monofilament To the lateral aspect left ankle wound the wound is fibrotic.  The wound measures 2 x 2 x 0.3 cm.  There is no exposed bone or tendon.  Mild rim of erythema without any ascending cellulitis.  No fluctuance or crepitation.  There is serosanguineous drainage expressed. No pain with calf compression, swelling, warmth, erythema  Assessment: Left lateral ankle ulcer  Plan: -All treatment options discussed with the patient including all alternatives, risks, complications.  -Debrided the tissue today with a #312 scalpel and complications.  This area is still very fibrotic.  Continue with Santyl dressing changes.  I did take a wound culture today as well. -Encouraged to follow-up with vascular surgery.  We will follow-up with MRI as well. -Continue offloading. -Monitor for any clinical signs or symptoms of infection and directed to call the office immediately should any occur or go to the ER. -Patient encouraged to call the office with any questions, concerns, change in symptoms.   *Repeat blood work next appointment  Kim [name] DPM

## 2021-02-22 NOTE — Patient Instructions (Signed)

## 2021-02-22 NOTE — Progress Notes (Signed)
02/22/2021, 4:22 PM   Endocrinology follow-up note  Subjective:    Patient ID: Kim [name], female    DOB: [birth year].  Kim [name] is being seen in follow-up after she was seen in consultation for management of currently uncontrolled symptomatic diabetes requested by  Renee Rival, FNP.   Past Medical History:  Diagnosis Date   Cancer [facility])    history of leukemia   Community acquired pneumonia 05/26/2013   Diabetes mellitus without complication ([place])    Encounter for screening mammogram for malignant neoplasm of breast 03/26/2019   Hypercholesteremia    Hypertension    Neuropathy    Seasonal allergies     Past Surgical History:  Procedure Laterality Date   COLONOSCOPY  12/28/2011   Procedure: COLONOSCOPY;  Surgeon: Danie Binder, MD;  Location: [facility];  Service: Endoscopy;  Laterality: N/A;  9:30 AM-changed to 8:30 [name] to notify pt   LAPAROSCOPY     TUBAL LIGATION     TUNNELED VENOUS CATHETER PLACEMENT      Social History   Socioeconomic History   Marital status: Divorced    Spouse name: Not on file   Number of children: 2   Years of education: Not on file   Highest education level: 11th grade  Occupational History   Not on file  Tobacco Use   Smoking status: Every Day    Packs/day: 0.50    Years: 40.00    Pack years: 20.00    Types: Cigarettes   Smokeless tobacco: Never  Substance and Sexual Activity   Alcohol use: No   Drug use: No   Sexual activity: Not Currently  Other Topics Concern   Not on file  Social History Narrative   Lives with 99 year old mother -caregiver    2 grown children   Son [name] in [place]    Daughter [name] in [place] with daughter [name]       Enjoys: shopping, play games       Diet: not as good a she would like, eats all food groups   Caffeine: 2 cups of coffee in the mornings, coke daily    Water: 3-4 cups daily       Wear seat belt    Smoke detectors at home   Does not use phone while driving      Social Determinants of Radio broadcast assistant Strain: Low Risk    Difficulty of Paying Living Expenses: Not hard at all  Food Insecurity: No Food Insecurity   Worried About Charity fundraiser in the Last Year: Never true   Arboriculturist in the Last Year: Never true  Transportation Needs: No Transportation Needs   Lack of Transportation (Medical): No   Lack of Transportation (Non-Medical): No  Physical Activity: Inactive   Days of Exercise per Week: 0 days   Minutes of Exercise per Session: 0 min  Stress: No Stress Concern Present   Feeling of Stress : Not at all  Social Connections: Socially Isolated   Frequency of Communication with Friends  and Family: More than three times a week   Frequency of Social Gatherings with Friends and Family: More than three times a week   Attends Religious Services: Never   Marine scientist or Organizations: No   Attends Archivist Meetings: Never   Marital Status: Divorced    Family History  Problem Relation Age of Onset   Hypothyroidism Mother    Diabetes Mother    Hypertension Mother    Thyroid disease Mother    Hypertension Father    Stroke Father    Colon cancer Neg Hx     Outpatient Encounter Medications as of 02/22/2021  Medication Sig   acetaminophen (TYLENOL) 500 MG tablet Take 500 mg by mouth every 6 (six) hours as needed. Pain.   atorvastatin (LIPITOR) 10 MG tablet TAKE ONE TABLET BY MOUTH DAILY AT 6 PM   Blood Glucose Monitoring Suppl (ACCU-CHEK GUIDE) w/Device KIT 1 Piece by Does not apply route as directed.   Cholecalciferol 50 MCG (2000 UT) CAPS Take 1 capsule (2,000 Units total) by mouth daily with breakfast.   collagenase (SANTYL) ointment Apply 1 application topically daily.   GE100 BLOOD GLUCOSE TEST test strip USE AS DIRECTED TO TEST TWICE DAILY   glipiZIDE (GLUCOTROL XL) 5 MG 24 hr  tablet TAKE ONE TABLET BY MOUTH DAILY WITH BREAKFAST   Lancets MISC 1 each by Does not apply route 4 (four) times daily. Use as directed to check blood glucose four times daily   losartan-hydrochlorothiazide (HYZAAR) 100-25 MG tablet TAKE ONE TABLET BY MOUTH ONCE DAILY   metFORMIN (GLUCOPHAGE) 500 MG tablet Take 1 tablet (500 mg total) by mouth 2 (two) times daily with a meal.   omeprazole (PRILOSEC) 20 MG capsule Take 1 capsule (20 mg total) by mouth daily as needed. Heartburn   silver sulfADIAZINE (SILVADENE) 1 % cream Apply 1 application topically 2 (two) times daily.   [DISCONTINUED] doxycycline (VIBRA-TABS) 100 MG tablet Take 1 tablet (100 mg total) by mouth 2 (two) times daily.   [DISCONTINUED] doxycycline (VIBRA-TABS) 100 MG tablet Take 1 tablet (100 mg total) by mouth 2 (two) times daily.   [DISCONTINUED] metFORMIN (GLUCOPHAGE) 1000 MG tablet TAKE ONE TABLET BY MOUTH TWICE DAILY WITH A MEAL   [DISCONTINUED] predniSONE (DELTASONE) 20 MG tablet Take 2 tablets (40 mg total) by mouth daily.   [DISCONTINUED] sulfamethoxazole-trimethoprim (BACTRIM DS) 800-160 MG tablet Take 1 tablet by mouth 2 (two) times daily.   No facility-administered encounter medications on file as of 02/22/2021.    ALLERGIES: No Known Allergies  VACCINATION STATUS: Immunization History  Administered Date(s) Administered   Influenza, Quadrivalent, Recombinant, Inj, Pf 12/06/2020   Moderna SARS-COV2 Booster Vaccination 06/09/2020   Moderna Sars-Covid-2 Vaccination 12/06/2020   Pneumococcal Polysaccharide-23 11/03/2010, 05/27/2013, 06/23/2019   Tdap 08/12/2019    Diabetes She presents for her follow-up diabetic visit. She has type 2 diabetes mellitus. Onset time: She was diagnosed at approximate age of 14 years. Her disease course has been improving. There are no hypoglycemic associated symptoms. Pertinent negatives for hypoglycemia include no confusion, headaches, pallor or seizures. Associated symptoms include  blurred vision and fatigue. Pertinent negatives for diabetes include no chest pain, no polydipsia, no polyphagia and no polyuria. There are no hypoglycemic complications. Symptoms are improving. Diabetic complications include nephropathy. Risk factors for coronary artery disease include diabetes mellitus, dyslipidemia, hypertension, family history, tobacco exposure, sedentary lifestyle and post-menopausal. Current diabetic treatment includes oral agent (monotherapy). Her weight is fluctuating minimally. She is following a generally  unhealthy diet. When asked about meal planning, she reported none. She has not had a previous visit with a dietitian. She rarely (She is wheelchair-bound due to deconditioning/disequilibrium.  ) participates in exercise. Her home blood glucose trend is fluctuating minimally. (She did not bring her logs, meter EAG is 155. Her POC a1c is 7.3%- stable. overall improving from 11.5%.  She did not document or report any major hypoglycemia.     ) An ACE inhibitor/angiotensin II receptor blocker is being taken. Eye exam is current.  Hyperlipidemia This is a chronic problem. The current episode started more than 1 year ago. Exacerbating diseases include diabetes. Pertinent negatives include no chest pain, myalgias or shortness of breath. Current antihyperlipidemic treatment includes statins. Risk factors for coronary artery disease include diabetes mellitus, hypertension, dyslipidemia, family history, post-menopausal and a sedentary lifestyle.  Hypertension This is a chronic problem. The current episode started more than 1 year ago. Associated symptoms include blurred vision. Pertinent negatives include no chest pain, headaches, palpitations or shortness of breath. Risk factors for coronary artery disease include diabetes mellitus, dyslipidemia, family history, post-menopausal state, smoking/tobacco exposure and sedentary lifestyle.    Review of Systems  Constitutional:  Positive for  fatigue. Negative for chills, fever and unexpected weight change.  HENT:  Negative for trouble swallowing and voice change.   Eyes:  Positive for blurred vision. Negative for visual disturbance.  Respiratory:  Negative for cough, shortness of breath and wheezing.   Cardiovascular:  Negative for chest pain, palpitations and leg swelling.  Gastrointestinal:  Negative for diarrhea, nausea and vomiting.  Endocrine: Negative for cold intolerance, heat intolerance, polydipsia, polyphagia and polyuria.  Musculoskeletal:  Positive for gait problem. Negative for arthralgias and myalgias.       She is wheelchair-bound due to deconditioning/disequilibrium.  Skin:  Negative for color change, pallor, rash and wound.  Neurological:  Negative for seizures and headaches.  Psychiatric/Behavioral:  Negative for confusion and suicidal ideas.    Objective:    Vitals with BMI 02/22/2021 02/07/2021 02/07/2021  Height 5' 7"  - 5' 7"   Weight 178 lbs 6 oz - 186 lbs  BMI 89.16 - 94.50  Systolic 388 828 -  Diastolic 68 80 -  Pulse 003 94 -    BP 102/68    Pulse 100    Ht 5' 7"  (1.702 m)    Wt 178 lb 6.4 oz (80.9 kg)    BMI 27.94 kg/m   Wt Readings from Last 3 Encounters:  02/22/21 178 lb 6.4 oz (80.9 kg)  02/07/21 186 lb (84.4 kg)  01/27/21 183 lb 1.9 oz (83.1 kg)      CMP ( most recent) CMP     Component Value Date/Time   NA 133 (L) 02/07/2021 1745   NA 141 02/01/2021 1345   K 4.6 02/07/2021 1745   CL 99 02/07/2021 1745   CO2 22 02/07/2021 1745   GLUCOSE 129 (H) 02/07/2021 1745   BUN 43 (H) 02/07/2021 1745   BUN 23 02/01/2021 1345   CREATININE 1.66 (H) 02/07/2021 1745   CREATININE 0.93 03/26/2019 1517   CALCIUM 8.6 (L) 02/07/2021 1745   PROT 6.8 11/02/2020 0911   ALBUMIN 4.1 11/02/2020 0911   AST 15 11/02/2020 0911   ALT 20 11/02/2020 0911   ALKPHOS 147 (H) 11/02/2020 0911   BILITOT <0.2 11/02/2020 0911   GFRNONAA 34 (L) 02/07/2021 1745   GFRNONAA 65 03/26/2019 1517   GFRAA 68 12/02/2019  1203   GFRAA 75 03/26/2019 1517  Diabetic Labs (most recent): Lab Results  Component Value Date   HGBA1C 7.3 (A) 02/22/2021   HGBA1C 7.2 (A) 11/08/2020   HGBA1C 6.9 07/06/2020    Lipid Panel     Component Value Date/Time   CHOL 135 11/02/2020 0911   TRIG 124 11/02/2020 0911   HDL 41 11/02/2020 0911   CHOLHDL 3.3 11/02/2020 0911   CHOLHDL 4.3 03/26/2019 1517   LDLCALC 72 11/02/2020 0911   LDLCALC 122 (H) 03/26/2019 1517   LABVLDL 22 11/02/2020 0911     Assessment & Plan:   1. Uncontrolled type 2 diabetes mellitus with hyperglycemia ([place])  - Kim [name] has currently uncontrolled symptomatic type 2 DM since  66 years of age.   She presents her log which shows significantly improved glycemic profile to near target levels.    She did not bring her logs, meter EAG is 155. Her POC a1c is 7.3%- stable. overall improving from 11.5%.  She did not document or report any major hypoglycemia.    Recent labs reviewed. - I had a long discussion with her about the progressive nature of diabetes and the pathology behind its complications. -her diabetes is complicated by CKD, sedentary life, smoking and she remains at a high risk for more acute and chronic complications which include CAD, CVA, retinopathy, and neuropathy. These are all discussed in detail with her.  - I have counseled her on diet  and weight management  by adopting a carbohydrate restricted/protein rich diet. Patient is encouraged to switch to  unprocessed or minimally processed     complex starch and increased protein intake (animal or plant source), fruits, and vegetables. -  she is advised to stick to a routine mealtimes to eat 3 meals  a day and avoid unnecessary snacks ( to snack only to correct hypoglycemia).   - she acknowledges that there is a room for improvement in her food and drink choices. - Suggestion is made for her to avoid simple carbohydrates  from her diet including Cakes, Sweet Desserts, Ice Cream,  Soda (diet and regular), Sweet Tea, Candies, Chips, Cookies, Store Bought Juices, Alcohol , Artificial Sweeteners,  Coffee Creamer, and "Sugar-free" Products, Lemonade. This will help patient to have more stable blood glucose profile and potentially avoid unintended weight gain.  The following Lifestyle Medicine recommendations according to [place]  [facility]) were discussed and and offered to patient and she  agrees to start the journey:  A. Whole Foods, Plant-Based Nutrition comprising of fruits and vegetables, plant-based proteins, whole-grain carbohydrates was discussed in detail with the patient.   A list for source of those nutrients were also provided to the patient.  Patient will use only water or unsweetened tea for hydration. B.  The need to stay away from risky substances including alcohol, smoking; obtaining 7 to 9 hours of restorative sleep, at least 150 minutes of moderate intensity exercise weekly, the importance of healthy social connections,  and stress management techniques were discussed.    - I have approached her with the following individualized plan to manage  her diabetes and patient agrees:   -Based on her presentation with near target glycemic profile, she will not need insulin treatment for now.   She wishes to avoid insulin treatment for now.  She is willing to continue to monitor blood glucose twice a day-daily before breakfast and at bedtime.     - she is encouraged to call clinic for blood glucose levels less than 70  or greater than 200 mg/Dl at fasting.  - Inlight of her CKD , she is advised to lower her metformin to 500 mg p.o. twice daily-daily after breakfast and after supper.  She is also benefiting from glipizide.  She is advised to continue glipizide 5 mg XL p.o. daily after breakfast.     - she is not a candidate for SGLT2 inhibitors nor incretin therapy.   - Specific targets for  A1c;  LDL, HDL,  and Triglycerides were discussed  with the patient.  2) Blood Pressure /Hypertension: Her BP is controlled to target. she is advised to continue her current medications including losartan/HCTZ 100/25 mg p.o. daily with breakfast .  3) Lipids/Hyperlipidemia:   Review of her recent lipid panel showed improved LDL to 93 from 122.  However, she is on atorvastatin.  She is advised to continue  atorvastatin 10 mg p.o. daily at bedtime.     Side effects and precautions discussed with her.  4)  Weight/Diet:  Body mass index is 27.94 kg/m.  -She is a candidate for some  weight loss.   I discussed with her the fact that loss of 5 - 10% of her  current body weight will have the most impact on her diabetes management.  Exercise, and detailed carbohydrates information provided  -  detailed on discharge instructions.  5) Chronic Care/Health Maintenance:  -she  is on ACEI/ARB and Statin medications and  is encouraged to initiate and continue to follow up with Ophthalmology, Dentist,  Podiatrist at least yearly or according to recommendations, and advised to  Quit smoking. I have recommended yearly flu vaccine and pneumonia vaccine at least every 5 years;  and  sleep for at least 7 hours a day.  She is wheelchair-bound, cannot exercise optimally.   6) subclinical hypothyroidism Her previsit labs show euthroid state, but high anti-thyroid antibodies -a future risk for thyroid dysfunction. She needs TFTs measurements 2 x yearly.  I did not initiate thyroid hormone replacement at this time.    - she is  advised to maintain close follow up with Renee Rival, FNP for primary care needs, as well as her other providers for optimal and coordinated care.     I spent 40 minutes in the care of the patient today including review of labs from [place], Lipids, Thyroid Function, Hematology (current and previous including abstractions from other facilities); face-to-face time discussing  her blood glucose readings/logs, discussing hypoglycemia and  hyperglycemia episodes and symptoms, medications doses, her options of short and long term treatment based on the latest standards of care / guidelines;  discussion about incorporating lifestyle medicine;  and documenting the encounter.    Please refer to Patient Instructions for Blood Glucose Monitoring and Insulin/Medications Dosing Guide"  in media tab for additional information. Please  also refer to " Patient Self Inventory" in the Media  tab for reviewed elements of pertinent patient history.  [name] participated in the discussions, expressed understanding, and voiced agreement with the above plans.  All questions were answered to her satisfaction. she is encouraged to contact clinic should she have any questions or concerns prior to her return visit.     Follow up plan: - Return in about 3 months (around 05/23/2021) for F/U with Pre-visit Labs, Meter, Logs, A1c here.Glade Lloyd, MD [facility] [address] [place], [place] [address] Phone: [phone]  Fax: [phone]    02/22/2021, 4:22 PM  This note was  partially dictated with voice recognition software. Similar sounding words can be transcribed inadequately or may not  be corrected upon review.

## 2021-02-23 ENCOUNTER — Other Ambulatory Visit: Payer: Self-pay | Admitting: Podiatry

## 2021-02-23 LAB — WOUND CULTURE
MICRO NUMBER:: 12845055
SPECIMEN QUALITY:: ADEQUATE

## 2021-02-23 MED ORDER — AMOXICILLIN-POT CLAVULANATE 875-125 MG PO TABS: 1.0000 | ORAL_TABLET | Freq: Two times a day (BID) | ORAL | 0 refills | Status: DC

## 2021-02-27 ENCOUNTER — Other Ambulatory Visit: Payer: Self-pay | Admitting: "Endocrinology

## 2021-02-27 ENCOUNTER — Other Ambulatory Visit: Payer: Self-pay | Admitting: Nurse Practitioner

## 2021-02-27 DIAGNOSIS — E785 Hyperlipidemia, unspecified: Secondary | ICD-10-CM

## 2021-02-27 DIAGNOSIS — E1169 Type 2 diabetes mellitus with other specified complication: Secondary | ICD-10-CM

## 2021-03-01 ENCOUNTER — Encounter: Payer: Self-pay | Admitting: Vascular Surgery

## 2021-03-01 ENCOUNTER — Other Ambulatory Visit: Payer: Self-pay

## 2021-03-01 ENCOUNTER — Ambulatory Visit (INDEPENDENT_AMBULATORY_CARE_PROVIDER_SITE_OTHER): Payer: Medicare Other | Admitting: Vascular Surgery

## 2021-03-01 VITALS — BP 136/84 | HR 117 | Temp 97.7°F | Ht 67.0 in | Wt 178.5 lb

## 2021-03-01 DIAGNOSIS — S81802A Unspecified open wound, left lower leg, initial encounter: Secondary | ICD-10-CM

## 2021-03-01 DIAGNOSIS — S81002A Unspecified open wound, left knee, initial encounter: Secondary | ICD-10-CM

## 2021-03-01 NOTE — Progress Notes (Signed)
Vascular and Vein Specialist of [place]  Patient name: Kim [name] MRN: [id number] DOB: 02-05-1956 Sex: female  REASON FOR CONSULT: Evaluation of adequacy of arterial flow for left ankle ulcer  HPI: Kim [name] is a 66 y.o. female, who is here today for evaluation of arterial flow.  She reports that she developed this as a rug burn.  She has severe mobility limitation and is in a wheelchair continuously.  She is able to stand slightly and transfer.  Apparently she rubbed her lateral ankle on a rug and initially developed ulceration and eschar.  This has been slow to heal.  She has seen Dr.Wagoner who has debrided this and has an MRI pending.  She initially was placed on antibiotics and had allergic reaction to this with total body rash.  She is now on a second antibiotic treatment.  She has no prior history of arterial insufficiency.  Does have a history of leukemia and apparently had her weakness related to chemotherapy treatment.  This was many years ago at [facility] clinic and I do not have any documentation of this.  Past Medical History:  Diagnosis Date   Cancer [facility])    history of leukemia   Community acquired pneumonia 05/26/2013   Diabetes mellitus without complication ([place])    Encounter for screening mammogram for malignant neoplasm of breast 03/26/2019   Hypercholesteremia    Hypertension    Neuropathy    Seasonal allergies     Family History  Problem Relation Age of Onset   Hypothyroidism Mother    Diabetes Mother    Hypertension Mother    Thyroid disease Mother    Hypertension Father    Stroke Father    Colon cancer Neg Hx     SOCIAL HISTORY: Social History   Socioeconomic History   Marital status: Divorced    Spouse name: Not on file   Number of children: 2   Years of education: Not on file   Highest education level: 11th grade  Occupational History   Not on file  Tobacco Use   Smoking status: Every Day     Packs/day: 0.50    Years: 40.00    Pack years: 20.00    Types: Cigarettes   Smokeless tobacco: Never  Substance and Sexual Activity   Alcohol use: No   Drug use: No   Sexual activity: Not Currently  Other Topics Concern   Not on file  Social History Narrative   Lives with 51 year old mother -caregiver    2 grown children   Son [name] in [place]    Daughter [name] in [place] with daughter [name]       Enjoys: shopping, play games       Diet: not as good a she would like, eats all food groups   Caffeine: 2 cups of coffee in the mornings, coke daily   Water: 3-4 cups daily       Wear seat belt    Smoke detectors at home   Does not use phone while driving      Social Determinants of Radio broadcast assistant Strain: Low Risk    Difficulty of Paying Living Expenses: Not hard at all  Food Insecurity: No Food Insecurity   Worried About Charity fundraiser in the Last Year: Never true   Arboriculturist in the Last Year: Never true  Transportation Needs: No Transportation Needs   Lack of Transportation (Medical): No  Lack of Transportation (Non-Medical): No  Physical Activity: Inactive   Days of Exercise per Week: 0 days   Minutes of Exercise per Session: 0 min  Stress: No Stress Concern Present   Feeling of Stress : Not at all  Social Connections: Socially Isolated   Frequency of Communication with Friends and Family: More than three times a week   Frequency of Social Gatherings with Friends and Family: More than three times a week   Attends Religious Services: Never   Marine scientist or Organizations: No   Attends Music therapist: Never   Marital Status: Divorced  Human resources officer Violence: Not At Risk   Fear of Current or Ex-Partner: No   Emotionally Abused: No   Physically Abused: No   Sexually Abused: No    No Known Allergies  Current Outpatient Medications  Medication Sig Dispense Refill   acetaminophen (TYLENOL)  500 MG tablet Take 500 mg by mouth every 6 (six) hours as needed. Pain.     amoxicillin-clavulanate (AUGMENTIN) 875-125 MG tablet Take 1 tablet by mouth 2 (two) times daily. 20 tablet 0   atorvastatin (LIPITOR) 10 MG tablet TAKE ONE TABLET BY MOUTH DAILY AT 6 PM 90 tablet 1   Blood Glucose Monitoring Suppl (ACCU-CHEK GUIDE) w/Device KIT 1 Piece by Does not apply route as directed. 1 kit 0   Cholecalciferol 50 MCG (2000 UT) CAPS Take 1 capsule (2,000 Units total) by mouth daily with breakfast. 90 capsule 1   collagenase (SANTYL) ointment Apply 1 application topically daily. 30 g 2   GE100 BLOOD GLUCOSE TEST test strip USE AS DIRECTED TO TEST TWICE DAILY 150 strip 2   glipiZIDE (GLUCOTROL XL) 5 MG 24 hr tablet TAKE ONE TABLET BY MOUTH DAILY WITH BREAKFAST 90 tablet 1   Lancets MISC 1 each by Does not apply route 4 (four) times daily. Use as directed to check blood glucose four times daily 200 each 1   losartan-hydrochlorothiazide (HYZAAR) 100-25 MG tablet TAKE ONE TABLET BY MOUTH ONCE DAILY 90 tablet 1   metFORMIN (GLUCOPHAGE) 1000 MG tablet TAKE ONE TABLET BY MOUTH TWICE DAILY WITH A MEAL 180 tablet 1   metFORMIN (GLUCOPHAGE) 500 MG tablet Take 1 tablet (500 mg total) by mouth 2 (two) times daily with a meal. 180 tablet 1   omeprazole (PRILOSEC) 20 MG capsule Take 1 capsule (20 mg total) by mouth daily as needed. Heartburn 30 capsule 3   silver sulfADIAZINE (SILVADENE) 1 % cream Apply 1 application topically 2 (two) times daily.     No current facility-administered medications for this visit.    REVIEW OF SYSTEMS:  [X]  denotes positive finding, [ ]  denotes negative finding Cardiac  Comments:  Chest pain or chest pressure:    Shortness of breath upon exertion:    Short of breath when lying flat:    Irregular heart rhythm:        Vascular    Pain in calf, thigh, or hip brought on by ambulation:    Pain in feet at night that wakes you up from your sleep:     Blood clot in your veins:    Leg  swelling:         Pulmonary    Oxygen at home:    Productive cough:     Wheezing:         Neurologic    Sudden weakness in arms or legs:     Sudden numbness in arms or legs:  Sudden onset of difficulty speaking or slurred speech:    Temporary loss of vision in one eye:     Problems with dizziness:         Gastrointestinal    Blood in stool:     Vomited blood:         Genitourinary    Burning when urinating:     Blood in urine:        Psychiatric    Major depression:         Hematologic    Bleeding problems:    Problems with blood clotting too easily:        Skin    Rashes or ulcers: x       Constitutional    Fever or chills:      PHYSICAL EXAM: Vitals:   03/01/21 1410  BP: 136/84  Pulse: (!) 117  Temp: 97.7 F (36.5 C)  TempSrc: Temporal  SpO2: 97%  Weight: 178 lb 8 oz (81 kg)  Height: 5' 7"  (1.702 m)    GENERAL: The patient is a well-nourished female, in no acute distress. The vital signs are documented above. CARDIOVASCULAR: 2+ radial pulses bilaterally.  2+ popliteal pulses bilaterally.  She has a 2+ left dorsalis pedis pulse PULMONARY: There is good air exchange  MUSCULOSKELETAL: There are no major deformities or cyanosis. NEUROLOGIC: No focal weakness or paresthesias are detected. SKIN: She has a 2 cm ulceration over her lateral malleolus on the left.  This is quite deep. PSYCHIATRIC: The patient has a normal affect.  DATA:  Noninvasive studies were reviewed with the patient.  This reveals ankle arm index of 0.89 with triphasic signals on the left and 0.77 with triphasic signals on the right  MEDICAL ISSUES: No evidence of significant arterial insufficiency in her left leg.  Near normal ABIs and normal triphasic waveforms.  Normal 2+ dorsalis pedis pulse.  I reassured her that fortunately she will not require revascularization prior to attempts for wound healing.  She is to continue her follow-up with Dr.Wagoner following MRI   Rosetta Posner, MD  [facility] and Ve[facility] Specialists of [facility] [phone] Pager [phone]  Note: Portions of this report may have been transcribed using voice recognition software.  Every effort has been made to ensure accuracy; however, inadvertent computerized transcription errors may still be present.

## 2021-03-06 ENCOUNTER — Telehealth: Payer: Self-pay

## 2021-03-06 NOTE — Telephone Encounter (Signed)
Patient called and wanting you to know that her son in law will not be able to drive her to [place] anymore because he has a new job , she states that she will like you to find her a new doctor in [place] or [place]/

## 2021-03-08 ENCOUNTER — Ambulatory Visit (HOSPITAL_COMMUNITY): Payer: Medicare Other

## 2021-03-09 ENCOUNTER — Ambulatory Visit: Payer: Medicare Other | Admitting: Podiatry

## 2021-03-10 ENCOUNTER — Other Ambulatory Visit: Payer: Self-pay | Admitting: "Endocrinology

## 2021-03-17 ENCOUNTER — Other Ambulatory Visit: Payer: Self-pay

## 2021-03-17 ENCOUNTER — Ambulatory Visit (HOSPITAL_COMMUNITY)
Admission: RE | Admit: 2021-03-17 | Discharge: 2021-03-17 | Disposition: A | Discharge status: Home / Self Care | Payer: Medicare Other | Source: Ambulatory Visit | Attending: Podiatry | Admitting: Podiatry

## 2021-03-17 DIAGNOSIS — M65872 Other synovitis and tenosynovitis, left ankle and foot: Secondary | ICD-10-CM | POA: Diagnosis not present

## 2021-03-17 DIAGNOSIS — M86172 Other acute osteomyelitis, left ankle and foot: Secondary | ICD-10-CM | POA: Insufficient documentation

## 2021-03-17 DIAGNOSIS — S91002A Unspecified open wound, left ankle, initial encounter: Secondary | ICD-10-CM | POA: Diagnosis not present

## 2021-03-17 DIAGNOSIS — L03116 Cellulitis of left lower limb: Secondary | ICD-10-CM | POA: Diagnosis not present

## 2021-03-17 DIAGNOSIS — M25472 Effusion, left ankle: Secondary | ICD-10-CM | POA: Diagnosis not present

## 2021-03-21 ENCOUNTER — Other Ambulatory Visit: Payer: Self-pay | Admitting: Podiatry

## 2021-03-21 MED ORDER — AMOXICILLIN-POT CLAVULANATE 875-125 MG PO TABS
1.0000 | ORAL_TABLET | Freq: Two times a day (BID) | ORAL | 0 refills | Status: DC
Start: 2021-03-21 — End: 2021-05-01

## 2021-03-22 ENCOUNTER — Telehealth: Payer: Self-pay | Admitting: Podiatry

## 2021-03-22 NOTE — Telephone Encounter (Signed)
Patient would like Dr. Jacqualyn Posey to call her back

## 2021-03-30 DIAGNOSIS — L97322 Non-pressure chronic ulcer of left ankle with fat layer exposed: Secondary | ICD-10-CM | POA: Diagnosis not present

## 2021-03-30 DIAGNOSIS — M869 Osteomyelitis, unspecified: Secondary | ICD-10-CM | POA: Diagnosis not present

## 2021-03-30 DIAGNOSIS — E11622 Type 2 diabetes mellitus with other skin ulcer: Secondary | ICD-10-CM | POA: Diagnosis not present

## 2021-04-04 ENCOUNTER — Ambulatory Visit (INDEPENDENT_AMBULATORY_CARE_PROVIDER_SITE_OTHER): Payer: Medicare Other | Admitting: Nurse Practitioner

## 2021-04-04 ENCOUNTER — Other Ambulatory Visit: Payer: Self-pay

## 2021-04-04 ENCOUNTER — Encounter: Payer: Self-pay | Admitting: Nurse Practitioner

## 2021-04-04 VITALS — BP 132/87 | HR 113 | Ht 69.0 in | Wt 179.0 lb

## 2021-04-04 DIAGNOSIS — E11622 Type 2 diabetes mellitus with other skin ulcer: Secondary | ICD-10-CM | POA: Diagnosis not present

## 2021-04-04 DIAGNOSIS — L97309 Non-pressure chronic ulcer of unspecified ankle with unspecified severity: Secondary | ICD-10-CM | POA: Diagnosis not present

## 2021-04-04 DIAGNOSIS — Z01818 Encounter for other preprocedural examination: Secondary | ICD-10-CM

## 2021-04-04 DIAGNOSIS — N179 Acute kidney failure, unspecified: Secondary | ICD-10-CM | POA: Diagnosis not present

## 2021-04-04 DIAGNOSIS — M869 Osteomyelitis, unspecified: Secondary | ICD-10-CM | POA: Diagnosis not present

## 2021-04-04 DIAGNOSIS — I10 Essential (primary) hypertension: Secondary | ICD-10-CM

## 2021-04-04 NOTE — Assessment & Plan Note (Addendum)
DASH diet and commitment to daily physical activity for a minimum of 30 minutes discussed and encouraged, as a part of hypertension management. The importance of attaining a healthy weight is also discussed.  BP/Weight 04/04/2021 03/01/2021 02/22/2021 02/07/2021 01/27/2021 11/08/2020 0/27/7412  Systolic BP 878 676 720 947 096 283 662  Diastolic BP 87 84 68 80 83 82 82  Wt. (Lbs) 179 178.5 178.4 186 183.12 180 182.8  BMI 26.43 27.96 27.94 29.13 28.68 29.05 29.5  well controlled o Takes losartan- hydrochlorothiazide 100-25 mg daily. CMP today  04/05/21 Continue current med, CMP normal today

## 2021-04-04 NOTE — Assessment & Plan Note (Addendum)
A1C 7.3 BP well controlled  Will check CMP+ EGFR due to AKI 2 months ago.  Pt medically optimized for procedure. please hydrate pt before procedure   04/05/21  AKI now resolved egfr 71, serum CR 0.89 on 04/05/2021 Pt medically optimized for procedure

## 2021-04-04 NOTE — Progress Notes (Signed)
° °  Kim [name]     MRN: [id number]      DOB: [birth year]   HPI Kim [name] is here for follow up surgical clearance for debridement of left ankle ulcer. Surgery will be done at [facility] on 04/07/2020 by Dr Caprice Beaver. She  states that she is doing well and ready for her procedure. Pt denies fever, chills, sob, wheezing, body aches, pain in the left ankle.    Followed by Dr Dorris Fetch for her T2DM last visit was 02/22/21 Last A1C was 7.3.    ROS Denies recent fever or chills. Denies sinus pressure, nasal congestion, ear pain or sore throat. Denies chest congestion, productive cough or wheezing. Denies chest pains, palpitations and leg swelling Denies abdominal pain, nausea, vomiting,diarrhea or constipation.   Denies dysuria, frequency, hesitancy or incontinence. Denies joint pain, swelling and limitation in mobility. Denies headaches, seizures, numbness, or tingling. Denies depression, anxiety or insomnia. Has ulcer on left ankle   PE  BP 132/87    Pulse (!) 113    Ht 5\' 9"  (1.753 m)    Wt 179 lb (81.2 kg)    SpO2 94%    BMI 26.43 kg/m   Patient alert and oriented and in no cardiopulmonary distress.  Chest: Clear to auscultation bilaterally.  CVS: S1, S2 no murmurs, no S3.Regular rate.  ABD: Soft non tender.   Ext: No edema  MS: reduced ROM spine, shoulders, hips and knees, sitting in a wheelchair today  Skin: pt deferred examination of left ankle wound today, dressing in place, dressing clean and dry, leg warm and dry  Psych: Good eye contact, normal affect. Memory intact not anxious or depressed appearing.  CNS: CN 2-12 intact, power,  normal throughout.no focal deficits noted.   Assessment & Plan

## 2021-04-04 NOTE — Patient Instructions (Signed)
Please get your labs done today, we will get back to you tomorrow about your lab result.   It is important that you exercise regularly at least 30 minutes 5 times a week.  Think about what you will eat, plan ahead. Choose " clean, green, fresh or frozen" over canned, processed or packaged foods which are more sugary, salty and fatty. 70 to 75% of food eaten should be vegetables and fruit. Three meals at set times with snacks allowed between meals, but they must be fruit or vegetables. Aim to eat over a 12 hour period , example 7 am to 7 pm, and STOP after  your last meal of the day. Drink water,generally about 64 ounces per day, no other drink is as healthy. Fruit juice is best enjoyed in a healthy way, by EATING the fruit.  Thanks for choosing [facility], we consider it a privelige to serve you.

## 2021-04-04 NOTE — Assessment & Plan Note (Addendum)
Pt on losartan-hctz 100-53m daily Will check BMP today will dc HCTZ if kidney function not improved.  Pt told to stay hydrated .    Lab Results  Component Value Date   NA 146 (H) 04/04/2021   K 4.0 04/04/2021   CO2 24 04/04/2021   GLUCOSE 199 (H) 04/04/2021   BUN 15 04/04/2021   CREATININE 0.89 04/04/2021   CALCIUM 9.8 04/04/2021   EGFR 71 04/04/2021   GFRNONAA 34 (L) 02/07/2021  AKI now resolved 2/22/23Kidney function back to normal..  egfr 71, Cr 0.89 on 04/05/21 Continue losartan-HCTZ 100-266mDrink plenty of water to stay hydrated

## 2021-04-04 NOTE — Assessment & Plan Note (Signed)
Has an upcoming debridement on 03/07/20  followed  by podiatry,  She was recently  treated with antibiotics

## 2021-04-05 ENCOUNTER — Encounter: Payer: Self-pay | Admitting: Nurse Practitioner

## 2021-04-05 ENCOUNTER — Telehealth: Payer: Self-pay

## 2021-04-05 LAB — CMP14+EGFR
ALT: 20 IU/L (ref 0–32)
AST: 17 IU/L (ref 0–40)
Albumin/Globulin Ratio: 1.5 (ref 1.2–2.2)
Albumin: 4.2 g/dL (ref 3.8–4.8)
Alkaline Phosphatase: 127 IU/L — ABNORMAL HIGH (ref 44–121)
BUN/Creatinine Ratio: 17 (ref 12–28)
BUN: 15 mg/dL (ref 8–27)
Bilirubin Total: 0.2 mg/dL (ref 0.0–1.2)
CO2: 24 mmol/L (ref 20–29)
Calcium: 9.8 mg/dL (ref 8.7–10.3)
Chloride: 103 mmol/L (ref 96–106)
Creatinine, Ser: 0.89 mg/dL (ref 0.57–1.00)
Globulin, Total: 2.8 g/dL (ref 1.5–4.5)
Glucose: 199 mg/dL — ABNORMAL HIGH (ref 70–99)
Potassium: 4 mmol/L (ref 3.5–5.2)
Sodium: 146 mmol/L — ABNORMAL HIGH (ref 134–144)
Total Protein: 7 g/dL (ref 6.0–8.5)
eGFR: 71 mL/min/{1.73_m2} (ref >59)

## 2021-04-05 NOTE — Telephone Encounter (Signed)
Patient calling about lab results. 

## 2021-04-05 NOTE — Telephone Encounter (Signed)
Patient aware of lab results.

## 2021-04-05 NOTE — Progress Notes (Signed)
Pls review result with pt, sodium level is slightly elevated, avoid salt , drink plenty of water to stay hydrated. Kidney function is normal, thanks

## 2021-04-06 DIAGNOSIS — Z01818 Encounter for other preprocedural examination: Secondary | ICD-10-CM | POA: Diagnosis not present

## 2021-04-07 DIAGNOSIS — E1142 Type 2 diabetes mellitus with diabetic polyneuropathy: Secondary | ICD-10-CM | POA: Diagnosis not present

## 2021-04-07 DIAGNOSIS — Z7984 Long term (current) use of oral hypoglycemic drugs: Secondary | ICD-10-CM | POA: Diagnosis not present

## 2021-04-07 DIAGNOSIS — L97329 Non-pressure chronic ulcer of left ankle with unspecified severity: Secondary | ICD-10-CM | POA: Diagnosis not present

## 2021-04-07 DIAGNOSIS — E785 Hyperlipidemia, unspecified: Secondary | ICD-10-CM | POA: Diagnosis not present

## 2021-04-07 DIAGNOSIS — M85872 Other specified disorders of bone density and structure, left ankle and foot: Secondary | ICD-10-CM | POA: Diagnosis not present

## 2021-04-07 DIAGNOSIS — E1169 Type 2 diabetes mellitus with other specified complication: Secondary | ICD-10-CM | POA: Diagnosis not present

## 2021-04-07 DIAGNOSIS — I10 Essential (primary) hypertension: Secondary | ICD-10-CM | POA: Diagnosis not present

## 2021-04-07 DIAGNOSIS — M869 Osteomyelitis, unspecified: Secondary | ICD-10-CM | POA: Diagnosis not present

## 2021-04-07 DIAGNOSIS — E1140 Type 2 diabetes mellitus with diabetic neuropathy, unspecified: Secondary | ICD-10-CM | POA: Diagnosis not present

## 2021-04-07 DIAGNOSIS — M868X7 Other osteomyelitis, ankle and foot: Secondary | ICD-10-CM | POA: Diagnosis not present

## 2021-04-07 DIAGNOSIS — L97322 Non-pressure chronic ulcer of left ankle with fat layer exposed: Secondary | ICD-10-CM | POA: Diagnosis not present

## 2021-04-07 DIAGNOSIS — E11622 Type 2 diabetes mellitus with other skin ulcer: Secondary | ICD-10-CM | POA: Diagnosis not present

## 2021-04-07 DIAGNOSIS — M7989 Other specified soft tissue disorders: Secondary | ICD-10-CM | POA: Diagnosis not present

## 2021-04-07 DIAGNOSIS — K219 Gastro-esophageal reflux disease without esophagitis: Secondary | ICD-10-CM | POA: Diagnosis not present

## 2021-04-07 DIAGNOSIS — Z79899 Other long term (current) drug therapy: Secondary | ICD-10-CM | POA: Diagnosis not present

## 2021-04-13 DIAGNOSIS — R9431 Abnormal electrocardiogram [ECG] [EKG]: Secondary | ICD-10-CM | POA: Diagnosis not present

## 2021-04-25 DIAGNOSIS — E11622 Type 2 diabetes mellitus with other skin ulcer: Secondary | ICD-10-CM | POA: Diagnosis not present

## 2021-04-25 DIAGNOSIS — M869 Osteomyelitis, unspecified: Secondary | ICD-10-CM | POA: Diagnosis not present

## 2021-04-25 DIAGNOSIS — L97322 Non-pressure chronic ulcer of left ankle with fat layer exposed: Secondary | ICD-10-CM | POA: Diagnosis not present

## 2021-05-01 ENCOUNTER — Telehealth: Payer: Self-pay

## 2021-05-01 ENCOUNTER — Other Ambulatory Visit: Payer: Self-pay

## 2021-05-01 ENCOUNTER — Ambulatory Visit (INDEPENDENT_AMBULATORY_CARE_PROVIDER_SITE_OTHER): Payer: Medicare Other | Admitting: Internal Medicine

## 2021-05-01 ENCOUNTER — Encounter: Payer: Self-pay | Admitting: Internal Medicine

## 2021-05-01 VITALS — BP 133/74 | HR 112 | Temp 98.3°F | Wt 179.0 lb

## 2021-05-01 DIAGNOSIS — E1169 Type 2 diabetes mellitus with other specified complication: Secondary | ICD-10-CM

## 2021-05-01 DIAGNOSIS — M869 Osteomyelitis, unspecified: Secondary | ICD-10-CM | POA: Diagnosis not present

## 2021-05-01 MED ORDER — AMOXICILLIN 500 MG PO CAPS: 1000.0000 mg | ORAL_CAPSULE | Freq: Three times a day (TID) | ORAL | 1 refills | Status: AC

## 2021-05-01 NOTE — Telephone Encounter (Signed)
Received call from [place] [facility] wanting to clarify that provider wanted a total of 60 days of amoxicillin. Per Dr. Candiss Norse, patient needs 6 weeks of therapy. Relayed this to the pharmacist. They will go ahead and dispense.  ? ?Beryle Flock, RN ? ?

## 2021-05-01 NOTE — Progress Notes (Signed)
? ?  ?Patient: Kim [name]  ?DOB: Nov 09, 1955 ?MRN: [id number] ?PCP: Renee Rival, FNP  ? ? ? ? ?Patient Active Problem List  ? Diagnosis Date Noted  ? Pre-operative clearance 04/04/2021  ? Acute kidney injury ([place]) 04/04/2021  ? Subclinical hypothyroidism 02/22/2021  ? Diabetic ulcer of ankle ([place]) 01/27/2021  ? Annual visit for general adult medical examination with abnormal findings 06/23/2019  ? Hyperlipidemia associated with type 2 diabetes mellitus ([place]) 06/23/2019  ? Encounter for screening for malignant neoplasm of cervix 06/23/2019  ? Post-menopausal 06/23/2019  ? Encounter for screening for human immunodeficiency virus (HIV) 06/23/2019  ? Vitamin D deficiency 06/23/2019  ? Special screening for malignant neoplasms, colon 06/23/2019  ? Uncontrolled type 2 diabetes mellitus with hyperglycemia ([place]) 06/04/2019  ? Mixed hyperlipidemia 06/04/2019  ? Encounter for screening for malignant neoplasm of colon 03/26/2019  ? Encounter for hepatitis C screening test for low risk patient 03/26/2019  ? COPD exacerbation ([place]) 05/26/2013  ? Essential hypertension, benign 05/26/2013  ? Diabetes ([place]) 05/26/2013  ?  ? ?Subjective:  ?Kim [name] is a 66 y.o. F PMHx as below presents for left ankle osteomyelitis. Referred by podiatry Caprice Beaver, DPM([facility]). Biopsy of left fibula on 04/07/21 showed osteomyelitis and Cx NG. Soft tissue Cx of left ankle on 04/07/21+ MSSA and stpah epidermidis. Last seen by podiatry on 04/25/21 and pt started on doxycyline. She was previously followed by  Dr. Sula Rumple) until Januray 2023. Pt did not have transportation and switched to care in [place]. Pt has been on multiple courses of Augmentin. She  reports she has had wound for  a few months following a fall.  ?MRI ankle on 03/17/21 showed lateral malleolus wound with surrounding cellulitis, subcortical bone marrow edema in lateral malleolus deep wound concerning for osteomyelitis,  peroneal tenosynovitis. ?Today 05/01/21:. She is tolerating doxycyline. Reports she has transportation issue. Denies fever, chills, N/V/D. She states she has not seen ortho surgery. ? ?Review of Systems  ?All other systems reviewed and are negative. ? ?Past Medical History:  ?Diagnosis Date  ? Cancer [facility])   ? history of leukemia  ? Community acquired pneumonia 05/26/2013  ? Diabetes mellitus without complication ([place])   ? Encounter for screening mammogram for malignant neoplasm of breast 03/26/2019  ? Hypercholesteremia   ? Hypertension   ? Neuropathy   ? Seasonal allergies   ? ? ?Outpatient Medications Prior to Visit  ?Medication Sig Dispense Refill  ? acetaminophen (TYLENOL) 500 MG tablet Take 500 mg by mouth every 6 (six) hours as needed. Pain.    ? amoxicillin-clavulanate (AUGMENTIN) 875-125 MG tablet Take 1 tablet by mouth 2 (two) times daily. 20 tablet 0  ? atorvastatin (LIPITOR) 10 MG tablet TAKE ONE TABLET BY MOUTH DAILY AT 6 PM 90 tablet 1  ? Blood Glucose Monitoring Suppl (ACCU-CHEK GUIDE) w/Device KIT 1 Piece by Does not apply route as directed. 1 kit 0  ? Cholecalciferol 50 MCG (2000 UT) CAPS Take 1 capsule (2,000 Units total) by mouth daily with breakfast. 90 capsule 1  ? collagenase (SANTYL) ointment Apply 1 application topically daily. 30 g 2  ? GE100 BLOOD GLUCOSE TEST test strip USE AS DIRECTED TO TEST TWICE DAILY 200 strip 2  ? glipiZIDE (GLUCOTROL XL) 5 MG 24 hr tablet TAKE ONE TABLET BY MOUTH DAILY WITH BREAKFAST 90 tablet 1  ? Lancets MISC 1 each by Does not apply route 4 (four) times daily. Use as directed to check blood  glucose four times daily 200 each 1  ? losartan-hydrochlorothiazide (HYZAAR) 100-25 MG tablet TAKE ONE TABLET BY MOUTH ONCE DAILY 90 tablet 1  ? metFORMIN (GLUCOPHAGE) 1000 MG tablet TAKE ONE TABLET BY MOUTH TWICE DAILY WITH A MEAL 180 tablet 1  ? metFORMIN (GLUCOPHAGE) 500 MG tablet Take 1 tablet (500 mg total) by mouth 2 (two) times daily with a meal. (Patient not taking:  Reported on 04/04/2021) 180 tablet 1  ? omeprazole (PRILOSEC) 20 MG capsule Take 1 capsule (20 mg total) by mouth daily as needed. Heartburn 30 capsule 3  ? silver sulfADIAZINE (SILVADENE) 1 % cream Apply 1 application topically 2 (two) times daily.    ? ?No facility-administered medications prior to visit.  ?  ? ?No Known Allergies ? ?Social History  ? ?Tobacco Use  ? Smoking status: Every Day  ?  Packs/day: 0.50  ?  Years: 40.00  ?  Pack years: 20.00  ?  Types: Cigarettes  ? Smokeless tobacco: Never  ?Substance Use Topics  ? Alcohol use: No  ? Drug use: No  ? ? ?Family History  ?Problem Relation Age of Onset  ? Hypothyroidism Mother   ? Diabetes Mother   ? Hypertension Mother   ? Thyroid disease Mother   ? Hypertension Father   ? Stroke Father   ? Colon cancer Neg Hx   ? ? ?Objective:  ?There were no vitals filed for this visit. ?There is no height or weight on file to calculate BMI. ? ?Physical Exam ?Constitutional:   ?   Appearance: Normal appearance.  ?HENT:  ?   Head: Normocephalic and atraumatic.  ?   Right Ear: Tympanic membrane normal.  ?   Left Ear: Tympanic membrane normal.  ?   Nose: Nose normal.  ?   Mouth/Throat:  ?   Mouth: Mucous membranes are moist.  ?Eyes:  ?   Extraocular Movements: Extraocular movements intact.  ?   Conjunctiva/sclera: Conjunctivae normal.  ?   Pupils: Pupils are equal, round, and reactive to light.  ?Cardiovascular:  ?   Rate and Rhythm: Normal rate and regular rhythm.  ?   Heart sounds: No murmur heard. ?  No friction rub. No gallop.  ?Pulmonary:  ?   Effort: Pulmonary effort is normal.  ?   Breath sounds: Normal breath sounds.  ?Abdominal:  ?   General: Abdomen is flat.  ?   Palpations: Abdomen is soft.  ?Musculoskeletal:     ?   General: Normal range of motion.  ?Skin: ?   General: Skin is warm and dry.  ?Neurological:  ?   General: No focal deficit present.  ?   Mental Status: She is alert and oriented to person, place, and time.  ?Psychiatric:     ?   Mood and Affect: Mood  normal.  ? ? ?Lab Results: ?Lab Results  ?Component Value Date  ? WBC 11.4 (H) 02/07/2021  ? HGB 14.4 02/07/2021  ? HCT 44.4 02/07/2021  ? MCV 92.7 02/07/2021  ? PLT 461 (H) 02/07/2021  ?  ?Lab Results  ?Component Value Date  ? CREATININE 0.89 04/04/2021  ? BUN 15 04/04/2021  ? NA 146 (H) 04/04/2021  ? K 4.0 04/04/2021  ? CL 103 04/04/2021  ? CO2 24 04/04/2021  ?  ?Lab Results  ?Component Value Date  ? ALT 20 04/04/2021  ? AST 17 04/04/2021  ? ALKPHOS 127 (H) 04/04/2021  ? BILITOT 0.2 04/04/2021  ?  ?04/07/21 ?Assessment & Plan:  ?#  Left ankle osteomyelitis ?-SP bone left fibula bx showing osteomyelitis and negative Cx ?-Cx from left ankle soft tissue + MSSA, Staph epidermidis ?-02/01/21 ABI showed mild left lower extremity arterial disease, abnormal toe brachial index. Seen by vascular on 03/01/21 and no revascularization required prior to wound healing attempts.  ?Plan: ?-Follow-up with podiatry in 2 weeks ?-D/C Doxycyline ?-Start amoxicillin gm tid to target MSSA ?-CBC, CMP, esr and crp ?-Follow-up in one month(ok with video visit) ? ?Laurice Record, MD ?[facility] ?[employer] ? ? ?05/01/21  ?1:52 PM  ?

## 2021-05-02 LAB — CBC WITH DIFFERENTIAL/PLATELET
Absolute Monocytes: 380 cells/uL (ref 200–950)
Basophils Absolute: 51 cells/uL (ref 0–200)
Basophils Relative: 0.7 %
Eosinophils Absolute: 102 cells/uL (ref 15–500)
Eosinophils Relative: 1.4 %
HCT: 43.1 % (ref 35.0–45.0)
Hemoglobin: 14.7 g/dL (ref 11.7–15.5)
Lymphs Abs: 2307 cells/uL (ref 850–3900)
MCH: 30.2 pg (ref 27.0–33.0)
MCHC: 34.1 g/dL (ref 32.0–36.0)
MCV: 88.5 fL (ref 80.0–100.0)
MPV: 10.2 fL (ref 7.5–12.5)
Monocytes Relative: 5.2 %
Neutro Abs: 4460 cells/uL (ref 1500–7800)
Neutrophils Relative %: 61.1 %
Platelets: 376 10*3/uL (ref 140–400)
RBC: 4.87 10*6/uL (ref 3.80–5.10)
RDW: 13.1 % (ref 11.0–15.0)
Total Lymphocyte: 31.6 %
WBC: 7.3 10*3/uL (ref 3.8–10.8)

## 2021-05-02 LAB — SEDIMENTATION RATE: Sed Rate: 36 mm/h — ABNORMAL HIGH (ref 0–30)

## 2021-05-02 LAB — COMPLETE METABOLIC PANEL WITH GFR
AG Ratio: 1.5 (calc) (ref 1.0–2.5)
ALT: 17 U/L (ref 6–29)
AST: 16 U/L (ref 10–35)
Albumin: 4.3 g/dL (ref 3.6–5.1)
Alkaline phosphatase (APISO): 118 U/L (ref 37–153)
BUN: 23 mg/dL (ref 7–25)
CO2: 27 mmol/L (ref 20–32)
Calcium: 9.8 mg/dL (ref 8.6–10.4)
Chloride: 103 mmol/L (ref 98–110)
Creat: 1.02 mg/dL (ref 0.50–1.05)
Globulin: 2.8 g/dL (calc) (ref 1.9–3.7)
Glucose, Bld: 206 mg/dL — ABNORMAL HIGH (ref 65–99)
Potassium: 4.1 mmol/L (ref 3.5–5.3)
Sodium: 144 mmol/L (ref 135–146)
Total Bilirubin: 0.3 mg/dL (ref 0.2–1.2)
Total Protein: 7.1 g/dL (ref 6.1–8.1)
eGFR: 60 mL/min/{1.73_m2} (ref >=60)

## 2021-05-02 LAB — C-REACTIVE PROTEIN: CRP: 6.4 mg/L (ref <8.0)

## 2021-05-09 DIAGNOSIS — M869 Osteomyelitis, unspecified: Secondary | ICD-10-CM | POA: Diagnosis not present

## 2021-05-09 DIAGNOSIS — E11622 Type 2 diabetes mellitus with other skin ulcer: Secondary | ICD-10-CM | POA: Diagnosis not present

## 2021-05-09 DIAGNOSIS — L97322 Non-pressure chronic ulcer of left ankle with fat layer exposed: Secondary | ICD-10-CM | POA: Diagnosis not present

## 2021-05-15 DIAGNOSIS — E1165 Type 2 diabetes mellitus with hyperglycemia: Secondary | ICD-10-CM | POA: Diagnosis not present

## 2021-05-16 LAB — COMPREHENSIVE METABOLIC PANEL
ALT: 30 IU/L (ref 0–32)
AST: 27 IU/L (ref 0–40)
Albumin/Globulin Ratio: 1.7 (ref 1.2–2.2)
Albumin: 4.5 g/dL (ref 3.8–4.8)
Alkaline Phosphatase: 116 IU/L (ref 44–121)
BUN/Creatinine Ratio: 21 (ref 12–28)
BUN: 21 mg/dL (ref 8–27)
Bilirubin Total: <0.2 mg/dL (ref 0.0–1.2)
CO2: 19 mmol/L — ABNORMAL LOW (ref 20–29)
Calcium: 9.9 mg/dL (ref 8.7–10.3)
Chloride: 98 mmol/L (ref 96–106)
Creatinine, Ser: 0.99 mg/dL (ref 0.57–1.00)
Globulin, Total: 2.6 g/dL (ref 1.5–4.5)
Glucose: 180 mg/dL — ABNORMAL HIGH (ref 70–99)
Potassium: 4.7 mmol/L (ref 3.5–5.2)
Sodium: 142 mmol/L (ref 134–144)
Total Protein: 7.1 g/dL (ref 6.0–8.5)
eGFR: 62 mL/min/{1.73_m2} (ref >59)

## 2021-05-23 DIAGNOSIS — M869 Osteomyelitis, unspecified: Secondary | ICD-10-CM | POA: Diagnosis not present

## 2021-05-23 DIAGNOSIS — L97322 Non-pressure chronic ulcer of left ankle with fat layer exposed: Secondary | ICD-10-CM | POA: Diagnosis not present

## 2021-05-23 DIAGNOSIS — E11622 Type 2 diabetes mellitus with other skin ulcer: Secondary | ICD-10-CM | POA: Diagnosis not present

## 2021-05-24 ENCOUNTER — Ambulatory Visit (INDEPENDENT_AMBULATORY_CARE_PROVIDER_SITE_OTHER): Payer: Medicare Other | Admitting: "Endocrinology

## 2021-05-24 ENCOUNTER — Encounter: Payer: Self-pay | Admitting: "Endocrinology

## 2021-05-24 VITALS — BP 124/84 | HR 92 | Ht 69.0 in | Wt 179.0 lb

## 2021-05-24 DIAGNOSIS — I10 Essential (primary) hypertension: Secondary | ICD-10-CM

## 2021-05-24 DIAGNOSIS — E1165 Type 2 diabetes mellitus with hyperglycemia: Secondary | ICD-10-CM

## 2021-05-24 DIAGNOSIS — E038 Other specified hypothyroidism: Secondary | ICD-10-CM | POA: Diagnosis not present

## 2021-05-24 DIAGNOSIS — E782 Mixed hyperlipidemia: Secondary | ICD-10-CM | POA: Diagnosis not present

## 2021-05-24 NOTE — Patient Instructions (Signed)

## 2021-05-24 NOTE — Progress Notes (Signed)
? ?                                                              ?     05/24/2021, 4:52 PM ? ? ?Endocrinology follow-up note ? ?Subjective:  ? ? Patient ID: Kim [name], female    DOB: June 02, 1955.  ?Kim [name] is being seen in follow-up after she was seen in consultation for management of currently uncontrolled symptomatic diabetes requested by  Renee Rival, FNP. ? ? ?Past Medical History:  ?Diagnosis Date  ? Cancer [facility])   ? history of leukemia  ? Community acquired pneumonia 05/26/2013  ? Diabetes mellitus without complication ([place])   ? Encounter for screening mammogram for malignant neoplasm of breast 03/26/2019  ? Hypercholesteremia   ? Hypertension   ? Neuropathy   ? Seasonal allergies   ? ? ?Past Surgical History:  ?Procedure Laterality Date  ? COLONOSCOPY  12/28/2011  ? Procedure: COLONOSCOPY;  Surgeon: Danie Binder, MD;  [facility];  Service: Endoscopy;  Laterality: N/A;  9:30 AM-changed to 8:30 [name] to notify pt  ? LAPAROSCOPY    ? TUBAL LIGATION    ? TUNNELED VENOUS CATHETER PLACEMENT    ? ? ?Social History  ? ?Socioeconomic History  ? Marital status: Divorced  ?  Spouse name: Not on file  ? Number of children: 2  ? Years of education: Not on file  ? Highest education level: 11th grade  ?Occupational History  ? Not on file  ?Tobacco Use  ? Smoking status: Every Day  ?  Packs/day: 0.50  ?  Years: 40.00  ?  Pack years: 20.00  ?  Types: Cigarettes  ? Smokeless tobacco: Never  ?Substance and Sexual Activity  ? Alcohol use: No  ? Drug use: No  ? Sexual activity: Not Currently  ?Other Topics Concern  ? Not on file  ?Social History Narrative  ? Lives with 59 year old mother -caregiver   ? 2 grown children  ? Son [name] in [place]   ? Daughter [name] in [place] with daughter [name]   ?   ? Enjoys: shopping, play games   ?   ? Diet: not as good a she would like, eats all food groups  ? Caffeine: 2 cups of coffee in the mornings, coke daily  ?  Water: 3-4 cups daily   ?   ? Wear seat belt   ? Smoke detectors at home  ? Does not use phone while driving  ?   ? ?Social Determinants of Health  ? ?Financial Resource Strain: Low Risk   ? Difficulty of Paying Living Expenses: Not hard at all  ?Food Insecurity: No Food Insecurity  ? Worried About Charity fundraiser in the Last Year: Never true  ? Ran Out of Food in the Last Year: Never true  ?Transportation Needs: No Transportation Needs  ? Lack of Transportation (Medical): No  ? Lack of Transportation (Non-Medical): No  ?Physical Activity: Inactive  ? Days of Exercise per Week: 0 days  ? Minutes of Exercise per Session: 0 min  ?Stress: No Stress Concern Present  ? Feeling of Stress : Not at all  ?Social Connections: Socially Isolated  ? Frequency of Communication with Friends  and Family: More than three times a week  ? Frequency of Social Gatherings with Friends and Family: More than three times a week  ? Attends Religious Services: Never  ? Active Member of Clubs or Organizations: No  ? Attends Archivist Meetings: Never  ? Marital Status: Divorced  ? ? ?Family History  ?Problem Relation Age of Onset  ? Hypothyroidism Mother   ? Diabetes Mother   ? Hypertension Mother   ? Thyroid disease Mother   ? Hypertension Father   ? Stroke Father   ? Colon cancer Neg Hx   ? ? ?Outpatient Encounter Medications as of 05/24/2021  ?Medication Sig  ? acetaminophen (TYLENOL) 500 MG tablet Take 500 mg by mouth every 6 (six) hours as needed. Pain.  ? amoxicillin (AMOXIL) 500 MG capsule Take 2 capsules (1,000 mg total) by mouth 3 (three) times daily.  ? atorvastatin (LIPITOR) 10 MG tablet TAKE ONE TABLET BY MOUTH DAILY AT 6 PM  ? Blood Glucose Monitoring Suppl (ACCU-CHEK GUIDE) w/Device KIT 1 Piece by Does not apply route as directed.  ? Cholecalciferol 50 MCG (2000 UT) CAPS Take 1 capsule (2,000 Units total) by mouth daily with breakfast.  ? collagenase (SANTYL) ointment Apply 1 application topically daily.  ? GE100  BLOOD GLUCOSE TEST test strip USE AS DIRECTED TO TEST TWICE DAILY  ? glipiZIDE (GLUCOTROL XL) 5 MG 24 hr tablet TAKE ONE TABLET BY MOUTH DAILY WITH BREAKFAST  ? Lancets MISC 1 each by Does not apply route 4 (four) times daily. Use as directed to check blood glucose four times daily  ? losartan-hydrochlorothiazide (HYZAAR) 100-25 MG tablet TAKE ONE TABLET BY MOUTH ONCE DAILY  ? metFORMIN (GLUCOPHAGE) 1000 MG tablet TAKE ONE TABLET BY MOUTH TWICE DAILY WITH A MEAL  ? omeprazole (PRILOSEC) 20 MG capsule Take 1 capsule (20 mg total) by mouth daily as needed. Heartburn  ? silver sulfADIAZINE (SILVADENE) 1 % cream Apply 1 application topically 2 (two) times daily.  ? [DISCONTINUED] metFORMIN (GLUCOPHAGE) 500 MG tablet Take 1 tablet (500 mg total) by mouth 2 (two) times daily with a meal. (Patient not taking: Reported on 04/04/2021)  ? ?No facility-administered encounter medications on file as of 05/24/2021.  ? ? ?ALLERGIES: ?Allergies  ?Allergen Reactions  ? Sulfamethoxazole-Trimethoprim Itching and Rash  ?  Rash all over body, itching  ? ? ?VACCINATION STATUS: ?Immunization History  ?Administered Date(s) Administered  ? Influenza, Quadrivalent, Recombinant, Inj, Pf 12/06/2020  ? Moderna SARS-COV2 Booster Vaccination 06/09/2020  ? Moderna Sars-Covid-2 Vaccination 12/06/2020  ? Pneumococcal Polysaccharide-23 11/03/2010, 05/27/2013, 06/23/2019  ? Tdap 08/12/2019  ? ? ?Diabetes ?She presents for her follow-up diabetic visit. She has type 2 diabetes mellitus. Onset time: She was diagnosed at approximate age of 32 years. Her disease course has been stable. There are no hypoglycemic associated symptoms. Pertinent negatives for hypoglycemia include no confusion, headaches, pallor or seizures. Associated symptoms include blurred vision and fatigue. Pertinent negatives for diabetes include no chest pain, no polydipsia, no polyphagia and no polyuria. There are no hypoglycemic complications. Symptoms are stable. Diabetic complications  include nephropathy. Risk factors for coronary artery disease include diabetes mellitus, dyslipidemia, hypertension, family history, tobacco exposure, sedentary lifestyle and post-menopausal. Current diabetic treatment includes oral agent (monotherapy). Her weight is fluctuating minimally. She is following a generally unhealthy diet. When asked about meal planning, she reported none. She has not had a previous visit with a dietitian. She rarely (She is wheelchair-bound due to deconditioning/disequilibrium.  ) participates in exercise.  Her home blood glucose trend is fluctuating minimally. (She presents with near target glycemic profile.  Her recent labs show A1c of 7.2%, overall improving from 11.5%.  She did not document any hypoglycemia.  In the interval, her metformin was increased to 1000 mg p.o. twice daily and she continues to tolerate.   ? ? ? ?) An ACE inhibitor/angiotensin II receptor blocker is being taken. Eye exam is current.  ?Hyperlipidemia ?This is a chronic problem. The current episode started more than 1 year ago. Exacerbating diseases include diabetes. Pertinent negatives include no chest pain, myalgias or shortness of breath. Current antihyperlipidemic treatment includes statins. Risk factors for coronary artery disease include diabetes mellitus, hypertension, dyslipidemia, family history, post-menopausal and a sedentary lifestyle.  ?Hypertension ?This is a chronic problem. The current episode started more than 1 year ago. Associated symptoms include blurred vision. Pertinent negatives include no chest pain, headaches, palpitations or shortness of breath. Risk factors for coronary artery disease include diabetes mellitus, dyslipidemia, family history, post-menopausal state, smoking/tobacco exposure and sedentary lifestyle.  ? ? ?Review of Systems  ?Constitutional:  Positive for fatigue. Negative for chills, fever and unexpected weight change.  ?HENT:  Negative for trouble swallowing and voice  change.   ?Eyes:  Positive for blurred vision. Negative for visual disturbance.  ?Respiratory:  Negative for cough, shortness of breath and wheezing.   ?Cardiovascular:  Negative for chest pain, palpitations and

## 2021-05-30 ENCOUNTER — Other Ambulatory Visit: Payer: Self-pay | Admitting: "Endocrinology

## 2021-05-30 DIAGNOSIS — E1165 Type 2 diabetes mellitus with hyperglycemia: Secondary | ICD-10-CM

## 2021-05-30 DIAGNOSIS — E1169 Type 2 diabetes mellitus with other specified complication: Secondary | ICD-10-CM

## 2021-05-31 ENCOUNTER — Other Ambulatory Visit: Payer: Self-pay

## 2021-05-31 DIAGNOSIS — I10 Essential (primary) hypertension: Secondary | ICD-10-CM

## 2021-05-31 MED ORDER — LOSARTAN POTASSIUM-HCTZ 100-25 MG PO TABS: 1.0000 | ORAL_TABLET | Freq: Every day | ORAL | 1 refills | Status: DC

## 2021-06-02 ENCOUNTER — Ambulatory Visit: Payer: Medicare Other | Admitting: Internal Medicine

## 2021-06-05 ENCOUNTER — Ambulatory Visit (INDEPENDENT_AMBULATORY_CARE_PROVIDER_SITE_OTHER): Payer: Medicare Other

## 2021-06-05 DIAGNOSIS — Z1231 Encounter for screening mammogram for malignant neoplasm of breast: Secondary | ICD-10-CM | POA: Diagnosis not present

## 2021-06-05 DIAGNOSIS — Z0000 Encounter for general adult medical examination without abnormal findings: Secondary | ICD-10-CM | POA: Diagnosis not present

## 2021-06-05 DIAGNOSIS — Z780 Asymptomatic menopausal state: Secondary | ICD-10-CM | POA: Diagnosis not present

## 2021-06-05 NOTE — Patient Instructions (Signed)
?  Kim [name] , ?Thank you for taking time to come for your Medicare Wellness Visit. I appreciate your ongoing commitment to your health goals. Please review the following plan we discussed and let me know if I can assist you in the future.  ? ? ?Mammogram and dexa ordered. Discuss with provider if you still need to have these done since you believe you were told they were no longer needed. ? ?These are the goals we discussed: ? Goals   ? ?  Patient Stated   ?  I would like to maintain my current state of health  ?  ?  Prevent falls   ? ?  ?  ?This is a list of the screening recommended for you and due dates:  ?Health Maintenance  ?Topic Date Due  ? Hepatitis C Screening: USPSTF Recommendation to screen - Ages 39-79 yo.  Never done  ? Zoster (Shingles) Vaccine (1 of 2) Never done  ? DEXA scan (bone density measurement)  Never done  ? Mammogram  04/05/2020  ? COVID-19 Vaccine (2 - Moderna risk series) 01/03/2021  ? Hemoglobin A1C  08/22/2021  ? Flu Shot  09/12/2021  ? Colon Cancer Screening  12/27/2021  ? Complete foot exam   01/27/2022  ? Eye exam for diabetics  02/09/2022  ? Tetanus Vaccine  08/11/2029  ? Pneumonia Vaccine  Completed  ? HPV Vaccine  Aged Out  ?  ?

## 2021-06-05 NOTE — Progress Notes (Signed)
? ?I connected with  Kim [name] on 06/05/21 by a audio enabled telemedicine application and verified that I am speaking with the correct person using two identifiers. ? ?Patient Location: Home ? ?Provider Location: Office/Clinic ? ?I discussed the limitations of evaluation and management by telemedicine. The patient expressed understanding and agreed to proceed.  ?Subjective:  ? Kim [name] is a 66 y.o. female who presents for Medicare Annual (Subsequent) preventive examination. ? ?Review of Systems    ?\ ?Cardiac Risk Factors include: diabetes mellitus;dyslipidemia;hypertension;smoking/ tobacco exposure;sedentary lifestyle ? ?   ?Objective:  ?  ?There were no vitals filed for this visit. ?There is no height or weight on file to calculate BMI. ? ? ?  06/05/2021  ?  9:41 AM 02/07/2021  ?  3:02 PM 06/01/2020  ?  9:31 AM 05/26/2013  ?  5:41 PM 12/28/2011  ?  8:20 AM  ?Advanced Directives  ?Does Patient Have a Medical Advance Directive? No No No Patient does not have advance directive Patient would like information;Patient does not have advance directive  ?Would patient like information on creating a medical advance directive? No - Patient declined No - Patient declined No - Patient declined    ?Pre-existing out of facility DNR order (yellow form or pink MOST form)    No No  ? ? ?Current Medications (verified) ?Outpatient Encounter Medications as of 06/05/2021  ?Medication Sig  ? acetaminophen (TYLENOL) 500 MG tablet Take 500 mg by mouth every 6 (six) hours as needed. Pain.  ? atorvastatin (LIPITOR) 10 MG tablet TAKE ONE TABLET BY MOUTH DAILY AT 6 PM  ? Blood Glucose Monitoring Suppl (ACCU-CHEK GUIDE) w/Device KIT 1 Piece by Does not apply route as directed.  ? Cholecalciferol 50 MCG (2000 UT) CAPS Take 1 capsule (2,000 Units total) by mouth daily with breakfast.  ? collagenase (SANTYL) ointment Apply 1 application topically daily.  ? GE100 BLOOD GLUCOSE TEST test strip USE AS DIRECTED TO TEST TWICE DAILY  ?  glipiZIDE (GLUCOTROL XL) 5 MG 24 hr tablet TAKE ONE TABLET BY MOUTH DAILY WITH BREAKFAST  ? Lancets MISC 1 each by Does not apply route 4 (four) times daily. Use as directed to check blood glucose four times daily  ? losartan-hydrochlorothiazide (HYZAAR) 100-25 MG tablet Take 1 tablet by mouth daily.  ? metFORMIN (GLUCOPHAGE) 1000 MG tablet TAKE ONE TABLET BY MOUTH TWICE DAILY WITH A MEAL  ? omeprazole (PRILOSEC) 20 MG capsule Take 1 capsule (20 mg total) by mouth daily as needed. Heartburn  ? silver sulfADIAZINE (SILVADENE) 1 % cream Apply 1 application topically 2 (two) times daily.  ? ?No facility-administered encounter medications on file as of 06/05/2021.  ? ? ?Allergies (verified) ?Sulfamethoxazole-trimethoprim  ? ?History: ?Past Medical History:  ?Diagnosis Date  ? Cancer [facility])   ? history of leukemia  ? Community acquired pneumonia 05/26/2013  ? Diabetes mellitus without complication ([place])   ? Encounter for screening mammogram for malignant neoplasm of breast 03/26/2019  ? Hypercholesteremia   ? Hypertension   ? Neuropathy   ? Seasonal allergies   ? ?Past Surgical History:  ?Procedure Laterality Date  ? COLONOSCOPY  12/28/2011  ? Procedure: COLONOSCOPY;  Surgeon: Danie Binder, MD;  [facility];  Service: Endoscopy;  Laterality: N/A;  9:30 AM-changed to 8:30 [name] to notify pt  ? LAPAROSCOPY    ? TUBAL LIGATION    ? TUNNELED VENOUS CATHETER PLACEMENT    ? ?Family History  ?Problem Relation Age of Onset  ?  Hypothyroidism Mother   ? Diabetes Mother   ? Hypertension Mother   ? Thyroid disease Mother   ? Hypertension Father   ? Stroke Father   ? Colon cancer Neg Hx   ? ?Social History  ? ?Socioeconomic History  ? Marital status: Divorced  ?  Spouse name: Not on file  ? Number of children: 2  ? Years of education: Not on file  ? Highest education level: 11th grade  ?Occupational History  ? Not on file  ?Tobacco Use  ? Smoking status: Every Day  ?  Packs/day: 0.50  ?  Years: 40.00  ?  Pack years: 20.00   ?  Types: Cigarettes  ? Smokeless tobacco: Never  ?Substance and Sexual Activity  ? Alcohol use: No  ? Drug use: No  ? Sexual activity: Not Currently  ?Other Topics Concern  ? Not on file  ?Social History Narrative  ? Lives with 1 year old mother -caregiver   ? 2 grown children  ? Son [name] in [place]   ? Daughter [name] in [place] with daughter [name]   ?   ? Enjoys: shopping, play games   ?   ? Diet: not as good a she would like, eats all food groups  ? Caffeine: 2 cups of coffee in the mornings, coke daily  ? Water: 3-4 cups daily   ?   ? Wear seat belt   ? Smoke detectors at home  ? Does not use phone while driving  ?   ? ?Social Determinants of Health  ? ?Financial Resource Strain: Low Risk   ? Difficulty of Paying Living Expenses: Not hard at all  ?Food Insecurity: No Food Insecurity  ? Worried About Charity fundraiser in the Last Year: Never true  ? Ran Out of Food in the Last Year: Never true  ?Transportation Needs: No Transportation Needs  ? Lack of Transportation (Medical): No  ? Lack of Transportation (Non-Medical): No  ?Physical Activity: Inactive  ? Days of Exercise per Week: 0 days  ? Minutes of Exercise per Session: 0 min  ?Stress: No Stress Concern Present  ? Feeling of Stress : Not at all  ?Social Connections: Socially Isolated  ? Frequency of Communication with Friends and Family: Three times a week  ? Frequency of Social Gatherings with Friends and Family: Three times a week  ? Attends Religious Services: Never  ? Active Member of Clubs or Organizations: No  ? Attends Archivist Meetings: Never  ? Marital Status: Divorced  ? ? ?Tobacco Counseling ?Ready to quit: Not Answered ?Counseling given: Not Answered ? ? ?Clinical Intake: ? ?Pre-visit preparation completed: Yes ? ?  ? ?  ? ?Nutritional Status: BMI 25 -29 Overweight ?Diabetes: Yes ?CBG done?: No ?Did pt. bring in CBG monitor from home?: No ? ?How often do you need to have someone help you when you read  instructions, pamphlets, or other written materials from your doctor or pharmacy?: 1 - Never ? ?Diabetic?Nutrition Risk Assessment: ? ?Has the patient had any N/V/D within the last 2 months?  No  ?Does the patient have any non-healing wounds?  No  ?Has the patient had any unintentional weight loss or weight gain?  No  ? ?Diabetes: ? ?Is the patient diabetic?  Yes  ?If diabetic, was a CBG obtained today?  No  ?Did the patient bring in their glucometer from home?  No  ?How often do you monitor your CBG's? daily.  ? ?  Financial Strains and Diabetes Management: ? ?Are you having any financial strains with the device, your supplies or your medication? No .  ?Does the patient want to be seen by Chronic Care Management for management of their diabetes?  No  ?Would the patient like to be referred to a Nutritionist or for Diabetic Management?  No  ? ?Diabetic Exams: ? ?Diabetic Eye Exam: [facility] ?Diabetic Foot Exam: Completed     ? ?Interpreter Needed?: No ? ?  ? ? ?Activities of Daily Living ? ?  06/05/2021  ?  9:29 AM  ?In your present state of health, do you have any difficulty performing the following activities:  ?Hearing? 0  ?Vision? 0  ?Difficulty concentrating or making decisions? 0  ?Walking or climbing stairs? 1  ?Dressing or bathing? 0  ?Doing errands, shopping? 0  ?Comment granddaughter helps her  ?Preparing Food and eating ? N  ?Using the Toilet? N  ?In the past six months, have you accidently leaked urine? N  ?Do you have problems with loss of bowel control? N  ?Managing your Medications? N  ?Managing your Finances? N  ?Housekeeping or managing your Housekeeping? N  ? ? ?Patient Care Team: ?Renee Rival, FNP as PCP - General (Nurse Practitioner) ? ?Indicate any recent Medical Services you may have received from other than [facility] providers in the past year (date may be approximate). ? ?   ?Assessment:  ? This is a routine wellness examination for [place]. ? ?Hearing/Vision screen ?No  results found. ? ?Dietary issues and exercise activities discussed: ?Current Exercise Habits: The patient does not participate in regular exercise at present, Exercise limited by: None identified ? ? Goals Add

## 2021-06-07 ENCOUNTER — Ambulatory Visit: Payer: Medicare Other

## 2021-06-08 DIAGNOSIS — L97322 Non-pressure chronic ulcer of left ankle with fat layer exposed: Secondary | ICD-10-CM | POA: Diagnosis not present

## 2021-06-08 DIAGNOSIS — E11622 Type 2 diabetes mellitus with other skin ulcer: Secondary | ICD-10-CM | POA: Diagnosis not present

## 2021-06-19 ENCOUNTER — Telehealth (INDEPENDENT_AMBULATORY_CARE_PROVIDER_SITE_OTHER): Payer: Medicare Other | Admitting: Internal Medicine

## 2021-06-19 ENCOUNTER — Other Ambulatory Visit: Payer: Self-pay

## 2021-06-19 DIAGNOSIS — M86672 Other chronic osteomyelitis, left ankle and foot: Secondary | ICD-10-CM | POA: Diagnosis not present

## 2021-06-19 NOTE — Progress Notes (Addendum)
Subjective:    Patient ID: MAITE BURLISON, female    DOB: 1955/04/10, 66 y.o.   MRN: [id number]     Virtual Visit via Telephone/Video Note   I connected with [name]  on 06/19/2021 at 2:56 PM by telephone and verified that I am speaking with the correct person using two identifiers.   I discussed the limitations, risks, security and privacy concerns of performing an evaluation and management service by telephone and the availability of in person appointments. I also discussed with the patient that there may be a patient responsible charge related to this service. The patient expressed understanding and agreed to proceed.  Location:   Patient: Home Provider: [facility]   HPI:  ANN-MARIE KLUGE is a 66 y.o. F PMHx as below presents for left ankle osteomyelitis. Referred by podiatry Caprice Beaver, DPM([facility]). Biopsy of left fibula on 04/07/21 showed osteomyelitis and Cx NG. Soft tissue Cx of left ankle on 04/07/21+ MSSA and stpah epidermidis. Last seen by podiatry on 04/25/21 and pt started on doxycyline. She was previously followed by  Dr. Sula Rumple) until Januray 2023. Pt did not have transportation and switched to care in [place]. Pt has been on multiple courses of Augmentin. She  reports she has had wound for  a few months following a fall.  MRI ankle on 03/17/21 showed lateral malleolus wound with surrounding cellulitis, subcortical bone marrow edema in lateral malleolus deep wound concerning for osteomyelitis, peroneal tenosynovitis.  05/01/21:. She is tolerating doxycyline. Reports she has transportation issue. Denies fever, chills, N/V/D. She states she has not seen ortho surgery.  Today video visit 06/19/21: Seen by Dr. Phyllis Ginger at [facility]), podiatry. Recc return in 2 weeks. Still taking Amoxillin tid.    Allergies  Allergen Reactions   Sulfamethoxazole-Trimethoprim Itching and Rash    Rash all over body, itching      Outpatient  Medications Prior to Visit  Medication Sig Dispense Refill   acetaminophen (TYLENOL) 500 MG tablet Take 500 mg by mouth every 6 (six) hours as needed. Pain.     atorvastatin (LIPITOR) 10 MG tablet TAKE ONE TABLET BY MOUTH DAILY AT 6 PM 90 tablet 1   Blood Glucose Monitoring Suppl (ACCU-CHEK GUIDE) w/Device KIT 1 Piece by Does not apply route as directed. 1 kit 0   Cholecalciferol 50 MCG (2000 UT) CAPS Take 1 capsule (2,000 Units total) by mouth daily with breakfast. 90 capsule 1   collagenase (SANTYL) ointment Apply 1 application topically daily. 30 g 2   GE100 BLOOD GLUCOSE TEST test strip USE AS DIRECTED TO TEST TWICE DAILY 200 strip 2   glipiZIDE (GLUCOTROL XL) 5 MG 24 hr tablet TAKE ONE TABLET BY MOUTH DAILY WITH BREAKFAST 90 tablet 1   Lancets MISC 1 each by Does not apply route 4 (four) times daily. Use as directed to check blood glucose four times daily 200 each 1   losartan-hydrochlorothiazide (HYZAAR) 100-25 MG tablet Take 1 tablet by mouth daily. 90 tablet 1   metFORMIN (GLUCOPHAGE) 1000 MG tablet TAKE ONE TABLET BY MOUTH TWICE DAILY WITH A MEAL 180 tablet 1   omeprazole (PRILOSEC) 20 MG capsule Take 1 capsule (20 mg total) by mouth daily as needed. Heartburn 30 capsule 3   silver sulfADIAZINE (SILVADENE) 1 % cream Apply 1 application topically 2 (two) times daily.     No facility-administered medications prior to visit.     Past Medical History:  Diagnosis Date   Cancer ([place])  history of leukemia   Community acquired pneumonia 05/26/2013   Diabetes mellitus without complication ([place])    Encounter for screening mammogram for malignant neoplasm of breast 03/26/2019   Hypercholesteremia    Hypertension    Neuropathy    Seasonal allergies      Past Surgical History:  Procedure Laterality Date   COLONOSCOPY  12/28/2011   Procedure: COLONOSCOPY;  Surgeon: Danie Binder, MD;  [facility];  Service: Endoscopy;  Laterality: N/A;  9:30 AM-changed to 8:30 [name] to  notify pt   LAPAROSCOPY     TUBAL LIGATION     TUNNELED VENOUS CATHETER PLACEMENT         Review of Systems  All other systems reviewed and are negative.    Objective:    Nursing note and vital signs reviewed.     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 Seen by vascular on 03/01/21 and no revascularization required prior to wound healing attempts.  Plan: -Stop amoxicillin(pt completed 7 weeks of antibiotics) -Follow-up with ID in 1 month, consider repeat imaging at that time -Podiatry may culture by Dr. Phyllis Ginger at [facility]), podiatry. Recc return in 2 weeks. Still taking Amoxillin tid.    Allergies  Allergen Reactions   Sulfamethoxazole-Trimethoprim Itching and Rash    Rash all over body, itching      Outpatient  Medications Prior to Visit  Medication Sig Dispense Refill   acetaminophen (TYLENOL) 500 MG tablet Take 500 mg by mouth every 6 (six) hours as needed. Pain.     atorvastatin (LIPITOR) 10 MG tablet TAKE ONE TABLET BY MOUTH DAILY AT 6 PM 90 tablet 1   Blood Glucose Monitoring Suppl (ACCU-CHEK GUIDE) w/Device KIT 1 Piece by Does not apply route as directed. 1 kit 0   Cholecalciferol 50 MCG (2000 UT) CAPS Take 1 capsule (2,000 Units total) by mouth daily with breakfast. 90 capsule 1   collagenase (SANTYL) ointment Apply 1 application topically daily. 30 g 2   GE100 BLOOD GLUCOSE TEST test strip USE AS DIRECTED TO TEST TWICE DAILY 200 strip 2   glipiZIDE (GLUCOTROL XL) 5 MG 24 hr tablet TAKE ONE TABLET BY MOUTH DAILY WITH BREAKFAST 90 tablet 1   Lancets MISC 1 each by Does not apply route 4 (four) times daily. Use as directed to check blood glucose four times daily 200 each 1   losartan-hydrochlorothiazide (HYZAAR) 100-25 MG tablet Take 1 tablet by mouth daily. 90 tablet 1   metFORMIN (GLUCOPHAGE) 1000 MG tablet TAKE ONE TABLET BY MOUTH TWICE DAILY WITH A MEAL 180 tablet 1   omeprazole (PRILOSEC) 20 MG capsule Take 1 capsule (20 mg total) by mouth daily as needed. Heartburn 30 capsule 3   silver sulfADIAZINE (SILVADENE) 1 % cream Apply 1 application topically 2 (two) times daily.     No facility-administered medications prior to visit.     Past Medical History:  Diagnosis Date   Cancer ([place])  history of leukemia   Community acquired pneumonia 05/26/2013   Diabetes mellitus without complication ([place])    Encounter for screening mammogram for malignant neoplasm of breast 03/26/2019   Hypercholesteremia    Hypertension    Neuropathy    Seasonal allergies      Past Surgical History:  Procedure Laterality Date   COLONOSCOPY  12/28/2011   Procedure: COLONOSCOPY;  Surgeon: Danie Binder, MD;  [facility];  Service: Endoscopy;  Laterality: N/A;  9:30 AM-changed to 8:30 [name] to  notify pt   LAPAROSCOPY     TUBAL LIGATION     TUNNELED VENOUS CATHETER PLACEMENT         Review of Systems  All other systems reviewed and are negative.    Objective:    Nursing note and vital signs reviewed.     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 Seen by vascular on 03/01/21 and no revascularization required prior to wound healing attempts.  Plan: -Stop amoxicillin(pt completed 7 weeks of antibiotics) -Follow-up with ID in 1 month, consider repeat imaging at that time -Podiatry may culture"wound" at next visit(Thursday)  I provided 25  minutes of non-face-to-face time during this encounter.

## 2021-06-22 DIAGNOSIS — E11622 Type 2 diabetes mellitus with other skin ulcer: Secondary | ICD-10-CM | POA: Diagnosis not present

## 2021-06-22 DIAGNOSIS — M869 Osteomyelitis, unspecified: Secondary | ICD-10-CM | POA: Diagnosis not present

## 2021-06-22 DIAGNOSIS — L97322 Non-pressure chronic ulcer of left ankle with fat layer exposed: Secondary | ICD-10-CM | POA: Diagnosis not present

## 2021-07-12 ENCOUNTER — Ambulatory Visit
Admission: RE | Admit: 2021-07-12 | Discharge: 2021-07-12 | Disposition: A | Discharge status: Home / Self Care | Payer: Medicare Other | Source: Ambulatory Visit | Attending: Nurse Practitioner | Admitting: Nurse Practitioner

## 2021-07-12 DIAGNOSIS — Z1231 Encounter for screening mammogram for malignant neoplasm of breast: Secondary | ICD-10-CM | POA: Diagnosis not present

## 2021-07-12 DIAGNOSIS — Z780 Asymptomatic menopausal state: Secondary | ICD-10-CM | POA: Diagnosis not present

## 2021-07-13 DIAGNOSIS — M869 Osteomyelitis, unspecified: Secondary | ICD-10-CM | POA: Diagnosis not present

## 2021-07-13 DIAGNOSIS — E11622 Type 2 diabetes mellitus with other skin ulcer: Secondary | ICD-10-CM | POA: Diagnosis not present

## 2021-07-13 DIAGNOSIS — L97322 Non-pressure chronic ulcer of left ankle with fat layer exposed: Secondary | ICD-10-CM | POA: Diagnosis not present

## 2021-07-13 NOTE — Progress Notes (Signed)
Normal bone density scan

## 2021-07-21 ENCOUNTER — Encounter: Payer: Self-pay | Admitting: Internal Medicine

## 2021-07-21 ENCOUNTER — Other Ambulatory Visit: Payer: Self-pay

## 2021-07-21 ENCOUNTER — Ambulatory Visit (INDEPENDENT_AMBULATORY_CARE_PROVIDER_SITE_OTHER): Payer: Medicare Other | Admitting: Internal Medicine

## 2021-07-21 VITALS — BP 137/84 | HR 103 | Temp 98.0°F | Ht 66.0 in | Wt 179.0 lb

## 2021-07-21 DIAGNOSIS — M868X7 Other osteomyelitis, ankle and foot: Secondary | ICD-10-CM

## 2021-07-21 NOTE — Progress Notes (Unsigned)
Patient Active Problem List   Diagnosis Date Noted   Pre-operative clearance 04/04/2021   Acute kidney injury ([place]) 04/04/2021   Subclinical hypothyroidism 02/22/2021   Diabetic ulcer of ankle ([place]) 01/27/2021   Annual visit for general adult medical examination with abnormal findings 06/23/2019   Hyperlipidemia associated with type 2 diabetes mellitus ([place]) 06/23/2019   Encounter for screening for malignant neoplasm of cervix 06/23/2019   Post-menopausal 06/23/2019   Encounter for screening for human immunodeficiency virus (HIV) 06/23/2019   Vitamin D deficiency 06/23/2019   Special screening for malignant neoplasms, colon 06/23/2019   Uncontrolled type 2 diabetes mellitus with hyperglycemia ([place]) 06/04/2019   Mixed hyperlipidemia 06/04/2019   Encounter for screening for malignant neoplasm of colon 03/26/2019   Encounter for hepatitis C screening test for low risk patient 03/26/2019   COPD exacerbation ([place]) 05/26/2013   Essential hypertension, benign 05/26/2013   Diabetes ([place]) 05/26/2013    Patient's Medications  New Prescriptions   No medications on file  Previous Medications   ACETAMINOPHEN (TYLENOL) 500 MG TABLET    Take 500 mg by mouth every 6 (six) hours as needed. Pain.   ATORVASTATIN (LIPITOR) 10 MG TABLET    TAKE ONE TABLET BY MOUTH DAILY AT 6 PM   BLOOD GLUCOSE MONITORING SUPPL (ACCU-CHEK GUIDE) W/DEVICE KIT    1 Piece by Does not apply route as directed.   CHOLECALCIFEROL 50 MCG (2000 UT) CAPS    Take 1 capsule (2,000 Units total) by mouth daily with breakfast.   COLLAGENASE (SANTYL) OINTMENT    Apply 1 application topically daily.   GE100 BLOOD GLUCOSE TEST TEST STRIP    USE AS DIRECTED TO TEST TWICE DAILY   GLIPIZIDE (GLUCOTROL XL) 5 MG 24 HR TABLET    TAKE ONE TABLET BY MOUTH DAILY WITH BREAKFAST   LANCETS MISC    1 each by Does not apply route 4 (four) times daily. Use as directed to check blood glucose four times daily   LOSARTAN-HYDROCHLOROTHIAZIDE  (HYZAAR) 100-25 MG TABLET    Take 1 tablet by mouth daily.   METFORMIN (GLUCOPHAGE) 1000 MG TABLET    TAKE ONE TABLET BY MOUTH TWICE DAILY WITH A MEAL   OMEPRAZOLE (PRILOSEC) 20 MG CAPSULE    Take 1 capsule (20 mg total) by mouth daily as needed. Heartburn   SILVER SULFADIAZINE (SILVADENE) 1 % CREAM    Apply 1 application topically 2 (two) times daily.  Modified Medications   No medications on file  Discontinued Medications   No medications on file    Subjective: Kim [name] is a 66 y.o. F PMHx as below presents for left ankle osteomyelitis. Referred by podiatry Kim [name], DPM([facility]). Biopsy of left fibula on 04/07/21 showed osteomyelitis and Cx NG. Soft tissue Cx of left ankle on 04/07/21+ MSSA and stpah epidermidis. Last seen by podiatry on 04/25/21 and pt started on doxycyline. She was previously followed by  Kim [name]) until Januray 2023. Pt did not have transportation and switched to care in [place]. Pt has been on multiple courses of Augmentin. She  reports she has had wound for  a few months following a fall.  MRI ankle on 03/17/21 showed lateral malleolus wound with surrounding cellulitis, subcortical bone marrow edema in lateral malleolus deep wound concerning for osteomyelitis, peroneal tenosynovitis.  05/01/21:. She is tolerating doxycyline. Reports she has transportation issue. Denies fever, chills, N/V/D. She states she has not seen ortho surgery.  video visit 06/19/21: Seen by Dr. Phyllis Ginger at [facility]), podiatry. Recc return in 2 weeks. Still taking Amoxillin tid.   07/21/21: Pt has been off of antibiotics. Reports she is going to podiatry and wound is closing. No new complaints. Denies any drainage from wound Review of Systems: Review of Systems  All other systems reviewed and are negative.   Past Medical History:  Diagnosis Date   Cancer Kim [name])    history of leukemia   Community acquired pneumonia 05/26/2013   Diabetes mellitus  without complication (Kim [name])    Encounter for screening mammogram for malignant neoplasm of breast 03/26/2019   Hypercholesteremia    Hypertension    Neuropathy    Seasonal allergies     Social History   Tobacco Use   Smoking status: Every Day    Packs/day: 0.50    Years: 40.00    Total pack years: 20.00    Types: Cigarettes   Smokeless tobacco: Never  Substance Use Topics   Alcohol use: No   Drug use: No    Family History  Problem Relation Age of Onset   Hypothyroidism Mother    Diabetes Mother    Hypertension Mother    Thyroid disease Mother    Hypertension Father    Stroke Father    Colon cancer Neg Hx     Allergies  Allergen Reactions   Sulfamethoxazole-Trimethoprim Itching and Rash    Rash all over body, itching    Health Maintenance  Topic Date Due   Hepatitis C Screening  Never done   Zoster Vaccines- Shingrix (1 of 2) Never done   COVID-19 Vaccine (2 - Moderna risk series) 01/03/2021   HEMOGLOBIN A1C  08/22/2021   INFLUENZA VACCINE  09/12/2021   COLONOSCOPY (Pts 45-21yr Insurance coverage will need to be confirmed)  12/27/2021   FOOT EXAM  01/27/2022   OPHTHALMOLOGY EXAM  02/09/2022   MAMMOGRAM  07/13/2022   TETANUS/TDAP  08/11/2029   Pneumonia Vaccine 66 Years old  Completed   DEXA SCAN  Completed   HPV VACCINES  Aged Out    Objective:  There were no vitals filed for this visit. There is no height or weight on file to calculate BMI.  Physical Exam Constitutional:      Appearance: Normal appearance.  HENT:     Head: Normocephalic and atraumatic.     Right Ear: Tympanic membrane normal.     Left Ear: Tympanic membrane normal.     Nose: Nose normal.     Mouth/Throat:     Mouth: Mucous membranes are moist.  Eyes:     Extraocular Movements: Extraocular movements intact.     Conjunctiva/sclera: Conjunctivae normal.     Pupils: Pupils are equal, round, and reactive to light.  Cardiovascular:     Rate and Rhythm: Normal rate and regular  rhythm.     Heart sounds: No murmur heard.    No friction rub. No gallop.  Pulmonary:     Effort: Pulmonary effort is normal.     Breath sounds: Normal breath sounds.  Abdominal:     General: Abdomen is flat.     Palpations: Abdomen is soft.  Musculoskeletal:        General: Normal range of motion.  Skin:    General: Skin is warm and dry.     Comments: 3cm ankle wound about 0.5 cm in depth  Neurological:     General: No focal deficit present.     Mental Status: She is alert and  oriented to person, place, and time.  Psychiatric:        Mood and Affect: Mood normal.     Lab Results Lab Results  Component Value Date   WBC 7.3 05/01/2021   HGB 14.7 05/01/2021   HCT 43.1 05/01/2021   MCV 88.5 05/01/2021   PLT 376 05/01/2021    Lab Results  Component Value Date   CREATININE 0.99 05/15/2021   BUN 21 05/15/2021   NA 142 05/15/2021   K 4.7 05/15/2021   CL 98 05/15/2021   CO2 19 (L) 05/15/2021    Lab Results  Component Value Date   ALT 30 05/15/2021   AST 27 05/15/2021   ALKPHOS 116 05/15/2021   BILITOT <0.2 05/15/2021    Lab Results  Component Value Date   CHOL 135 11/02/2020   HDL 41 11/02/2020   LDLCALC 72 11/02/2020   TRIG 124 11/02/2020   CHOLHDL 3.3 11/02/2020   No results found for: "LABRPR", "RPRTITER" No results found for: "HIV1RNAQUANT", "HIV1RNAVL", "CD4TABS"   #Left ankle osteomyelitis -SP bone left fibula bx showing osteomyelitis and negative Cx -Cx from left ankle soft tissue + MSSA, Staph epidermidis -02/01/21 ABI showed mild left lower extremity arterial disease, abnormal toe brachial index. Seen by vascular on 03/01/21 and no revascularization required prior to wound healing attempts.  -Pt reprots podiatry at [facility] performed Bx of ankle and it was negative -She does have a 3 cm open wound as such will get MRI Plan: -Labs today: cbc, cmp, esr and crp -MRI left ankle -Follow-up in 3 months   Laurice Record, MD [facility] 07/21/2021, 2:53 PM

## 2021-07-22 LAB — COMPLETE METABOLIC PANEL WITH GFR
AG Ratio: 1.5 (calc) (ref 1.0–2.5)
ALT: 17 U/L (ref 6–29)
AST: 15 U/L (ref 10–35)
Albumin: 4.1 g/dL (ref 3.6–5.1)
Alkaline phosphatase (APISO): 112 U/L (ref 37–153)
BUN: 18 mg/dL (ref 7–25)
CO2: 29 mmol/L (ref 20–32)
Calcium: 10 mg/dL (ref 8.6–10.4)
Chloride: 102 mmol/L (ref 98–110)
Creat: 0.95 mg/dL (ref 0.50–1.05)
Globulin: 2.8 g/dL (calc) (ref 1.9–3.7)
Glucose, Bld: 228 mg/dL — ABNORMAL HIGH (ref 65–99)
Potassium: 4.4 mmol/L (ref 3.5–5.3)
Sodium: 141 mmol/L (ref 135–146)
Total Bilirubin: 0.3 mg/dL (ref 0.2–1.2)
Total Protein: 6.9 g/dL (ref 6.1–8.1)
eGFR: 66 mL/min/{1.73_m2} (ref >=60)

## 2021-07-22 LAB — CBC WITH DIFFERENTIAL/PLATELET
Absolute Monocytes: 439 cells/uL (ref 200–950)
Basophils Absolute: 52 cells/uL (ref 0–200)
Basophils Relative: 0.6 %
Eosinophils Absolute: 129 cells/uL (ref 15–500)
Eosinophils Relative: 1.5 %
HCT: 42.1 % (ref 35.0–45.0)
Hemoglobin: 14.3 g/dL (ref 11.7–15.5)
Lymphs Abs: 2210 cells/uL (ref 850–3900)
MCH: 30 pg (ref 27.0–33.0)
MCHC: 34 g/dL (ref 32.0–36.0)
MCV: 88.4 fL (ref 80.0–100.0)
MPV: 10 fL (ref 7.5–12.5)
Monocytes Relative: 5.1 %
Neutro Abs: 5771 cells/uL (ref 1500–7800)
Neutrophils Relative %: 67.1 %
Platelets: 361 10*3/uL (ref 140–400)
RBC: 4.76 10*6/uL (ref 3.80–5.10)
RDW: 12.8 % (ref 11.0–15.0)
Total Lymphocyte: 25.7 %
WBC: 8.6 10*3/uL (ref 3.8–10.8)

## 2021-07-22 LAB — SEDIMENTATION RATE: Sed Rate: 33 mm/h — ABNORMAL HIGH (ref 0–30)

## 2021-07-22 LAB — C-REACTIVE PROTEIN: CRP: 15.2 mg/L — ABNORMAL HIGH (ref <8.0)

## 2021-07-28 ENCOUNTER — Encounter: Payer: Self-pay | Admitting: Nurse Practitioner

## 2021-07-28 ENCOUNTER — Ambulatory Visit (INDEPENDENT_AMBULATORY_CARE_PROVIDER_SITE_OTHER): Payer: Medicare Other | Admitting: Nurse Practitioner

## 2021-07-28 VITALS — BP 130/78 | HR 104 | Ht 67.0 in | Wt 176.0 lb

## 2021-07-28 DIAGNOSIS — F17200 Nicotine dependence, unspecified, uncomplicated: Secondary | ICD-10-CM | POA: Diagnosis not present

## 2021-07-28 DIAGNOSIS — J441 Chronic obstructive pulmonary disease with (acute) exacerbation: Secondary | ICD-10-CM

## 2021-07-28 DIAGNOSIS — I10 Essential (primary) hypertension: Secondary | ICD-10-CM

## 2021-07-28 DIAGNOSIS — E782 Mixed hyperlipidemia: Secondary | ICD-10-CM | POA: Diagnosis not present

## 2021-07-28 DIAGNOSIS — E663 Overweight: Secondary | ICD-10-CM | POA: Insufficient documentation

## 2021-07-28 DIAGNOSIS — E1165 Type 2 diabetes mellitus with hyperglycemia: Secondary | ICD-10-CM

## 2021-07-28 DIAGNOSIS — E559 Vitamin D deficiency, unspecified: Secondary | ICD-10-CM

## 2021-07-28 NOTE — Progress Notes (Signed)
Kim [name]     MRN: [id number]      DOB: 1955-03-28   HPI Kim [name] with past medical history of essential hypertension, COPD, uncontrolled type 2 diabetes, mixed hyperlipidemia, vitamin D deficiency is here for follow up and re-evaluation of chronic medical conditions, medication management and review of any available recent lab    COPD .currently not on inhalers ,smokes one pack of cigarettes every 2 days, smoking cessation education encouraged.  Patient denies wheezing, shortness of breath, cough  Osteomyelitis of left ankle , followed by infectious disease and podiatry plan is for MRI and follow-up in 3 months has upcoming appoitment with podiatry next teusday .  Patient states that her wound is healing well   Patient is due for shingles vaccine patient encouraged to get the vaccine at her pharmacy.   ROS Denies recent fever or chills. Denies sinus pressure, nasal congestion, ear pain or sore throat. Denies chest congestion, productive cough or wheezing. Denies chest pains, palpitations and leg swelling Denies abdominal pain, nausea, vomiting,diarrhea or constipation.   Denies dysuria, frequency, hesitancy or incontinence. Denies headaches, seizures, numbness, or tingling. Denies depression, anxiety or insomnia.    PE  BP 130/78   Pulse (!) 104   Ht 5' 7"  (1.702 m)   Wt 176 lb (79.8 kg)   SpO2 97%   BMI 27.57 kg/m   Patient alert and oriented and in no cardiopulmonary distress.  Chest: Clear to auscultation bilaterally.  CVS: S1, S2 no murmurs, no S3.Regular rate.  ABD: Soft non tender.   Ext: dressing noted on left ankle , dressing clean and dry, left leg warm to touch  MS: Patient sitting in a wheelchair today, has normal range of motion of the spine ,knees  Psych: Good eye contact, normal affect. Memory intact not anxious or depressed appearing.    Assessment & Plan Essential hypertension, benign BP Readings from Last 3 Encounters:  07/28/21  130/78  07/21/21 137/84  05/24/21 124/84  Chronic condition well-controlled on losartan-hydrochlorothiazide 100-25 mg 1 tablet daily DASH diet advised Engage in regular daily exercises at least for 50 minutes as tolerated Lab Results  Component Value Date   NA 141 07/21/2021   K 4.4 07/21/2021   CO2 29 07/21/2021   GLUCOSE 228 (H) 07/21/2021   BUN 18 07/21/2021   CREATININE 0.95 07/21/2021   CALCIUM 10.0 07/21/2021   EGFR 66 07/21/2021   GFRNONAA 34 (L) 02/07/2021    COPD (chronic obstructive pulmonary disease) ([place]) Patient denies shortness of breath, cough, wheezing today Smoking cessation education completed  Uncontrolled type 2 diabetes mellitus with hyperglycemia ([place]) Followed by endocrinology stated that her most recent A1c was 7.4 Currently on metformin 1000 mg twice daily, glipizide 5 mg daily Denies hypoglycemia, patient encouraged to monitor her blood sugar twice daily at home Up-to-date with eye exam and foot exam Urine creatinine labs ordered today Avoid sugar sweets soda Patient encouraged to maintain close follow-up with endocrinology   Mixed hyperlipidemia Currently on atorvastatin 10 mg Check lipid panel Avoid fried fatty foods LDL goal is less than 70   Vitamin D deficiency On vitamin D 2000 units daily Check vitamin D levels  Current every day smoker Smokes about 1 pack of cigarettes every 2 days   Asked about quitting: confirms that he/she currently smokes cigarettes Advise to quit smoking: Educated about QUITTING to reduce the risk of cancer, cardio and cerebrovascular disease. Assess willingness: Unwilling to quit at this time, but is working on cutting  back. Assist with counseling and pharmacotherapy: Counseled for 5 minutes and literature provided. Arrange for follow up: follow up in 6 months and continue to offer help.  Overweight (BMI 25.0-29.9) Wt Readings from Last 3 Encounters:  07/28/21 176 lb (79.8 kg)  07/21/21 179 lb (81.2 kg)   05/24/21 179 lb (81.2 kg)  Patient counseled on low-carb diet, encouraged to engage in regular moderate exercises at least for 50 minutes weekly as tolerated

## 2021-07-28 NOTE — Assessment & Plan Note (Signed)
On vitamin D 2000 units daily Check vitamin D levels

## 2021-07-28 NOTE — Assessment & Plan Note (Signed)
Wt Readings from Last 3 Encounters:  07/28/21 176 lb (79.8 kg)  07/21/21 179 lb (81.2 kg)  05/24/21 179 lb (81.2 kg)  Patient counseled on low-carb diet, encouraged to engage in regular moderate exercises at least for 50 minutes weekly as tolerated

## 2021-07-28 NOTE — Assessment & Plan Note (Signed)
Smokes about 1 pack of cigarettes every 2 days   Asked about quitting: confirms that he/she currently smokes cigarettes Advise to quit smoking: Educated about QUITTING to reduce the risk of cancer, cardio and cerebrovascular disease. Assess willingness: Unwilling to quit at this time, but is working on cutting back. Assist with counseling and pharmacotherapy: Counseled for 5 minutes and literature provided. Arrange for follow up: follow up in 6 months and continue to offer help.

## 2021-07-28 NOTE — Patient Instructions (Addendum)
Please get your shingles vaccine at your pharmacy .   It is important that you exercise regularly at least 30 minutes 5 times a week as tolerated  Think about what you will eat, plan ahead. Choose " clean, green, fresh or frozen" over canned, processed or packaged foods which are more sugary, salty and fatty. 70 to 75% of food eaten should be vegetables and fruit. Three meals at set times with snacks allowed between meals, but they must be fruit or vegetables. Aim to eat over a 12 hour period , example 7 am to 7 pm, and STOP after  your last meal of the day. Drink water,generally about 64 ounces per day, no other drink is as healthy. Fruit juice is best enjoyed in a healthy way, by EATING the fruit.  Thanks for choosing [facility], we consider it a privelige to serve you.  

## 2021-07-28 NOTE — Assessment & Plan Note (Signed)
Currently on atorvastatin 10 mg Check lipid panel Avoid fried fatty foods LDL goal is less than 70

## 2021-07-28 NOTE — Assessment & Plan Note (Signed)
BP Readings from Last 3 Encounters:  07/28/21 130/78  07/21/21 137/84  05/24/21 124/84  Chronic condition well-controlled on losartan-hydrochlorothiazide 100-25 mg 1 tablet daily DASH diet advised Engage in regular daily exercises at least for 50 minutes as tolerated Lab Results  Component Value Date   NA 141 07/21/2021   K 4.4 07/21/2021   CO2 29 07/21/2021   GLUCOSE 228 (H) 07/21/2021   BUN 18 07/21/2021   CREATININE 0.95 07/21/2021   CALCIUM 10.0 07/21/2021   EGFR 66 07/21/2021   GFRNONAA 34 (L) 02/07/2021

## 2021-07-28 NOTE — Assessment & Plan Note (Signed)
Patient denies shortness of breath, cough, wheezing today Smoking cessation education completed

## 2021-07-28 NOTE — Assessment & Plan Note (Signed)
Followed by endocrinology stated that her most recent A1c was 7.4 Currently on metformin 1000 mg twice daily, glipizide 5 mg daily Denies hypoglycemia, patient encouraged to monitor her blood sugar twice daily at home Up-to-date with eye exam and foot exam Urine creatinine labs ordered today Avoid sugar sweets soda Patient encouraged to maintain close follow-up with endocrinology

## 2021-08-03 DIAGNOSIS — L97322 Non-pressure chronic ulcer of left ankle with fat layer exposed: Secondary | ICD-10-CM | POA: Diagnosis not present

## 2021-08-03 DIAGNOSIS — E11622 Type 2 diabetes mellitus with other skin ulcer: Secondary | ICD-10-CM | POA: Diagnosis not present

## 2021-08-04 ENCOUNTER — Encounter (HOSPITAL_COMMUNITY): Payer: Self-pay | Admitting: Radiology

## 2021-08-04 ENCOUNTER — Ambulatory Visit (HOSPITAL_COMMUNITY)
Admission: RE | Admit: 2021-08-04 | Discharge: 2021-08-04 | Disposition: A | Discharge status: Home / Self Care | Payer: Medicare Other | Source: Ambulatory Visit | Attending: Internal Medicine | Admitting: Internal Medicine

## 2021-08-04 DIAGNOSIS — M868X7 Other osteomyelitis, ankle and foot: Secondary | ICD-10-CM | POA: Diagnosis not present

## 2021-08-04 DIAGNOSIS — S91002A Unspecified open wound, left ankle, initial encounter: Secondary | ICD-10-CM | POA: Diagnosis not present

## 2021-08-04 DIAGNOSIS — S93492A Sprain of other ligament of left ankle, initial encounter: Secondary | ICD-10-CM | POA: Diagnosis not present

## 2021-08-04 DIAGNOSIS — M65872 Other synovitis and tenosynovitis, left ankle and foot: Secondary | ICD-10-CM | POA: Diagnosis not present

## 2021-08-04 DIAGNOSIS — M25472 Effusion, left ankle: Secondary | ICD-10-CM | POA: Diagnosis not present

## 2021-08-09 DIAGNOSIS — E1165 Type 2 diabetes mellitus with hyperglycemia: Secondary | ICD-10-CM | POA: Diagnosis not present

## 2021-08-09 DIAGNOSIS — E559 Vitamin D deficiency, unspecified: Secondary | ICD-10-CM | POA: Diagnosis not present

## 2021-08-09 DIAGNOSIS — E782 Mixed hyperlipidemia: Secondary | ICD-10-CM | POA: Diagnosis not present

## 2021-08-11 LAB — LIPID PANEL
Chol/HDL Ratio: 3.4 ratio (ref 0.0–4.4)
Cholesterol, Total: 141 mg/dL (ref 100–199)
HDL: 42 mg/dL (ref >39)
LDL Chol Calc (NIH): 76 mg/dL (ref 0–99)
Triglycerides: 131 mg/dL (ref 0–149)
VLDL Cholesterol Cal: 23 mg/dL (ref 5–40)

## 2021-08-11 LAB — MICROALBUMIN / CREATININE URINE RATIO
Creatinine, Urine: 94.9 mg/dL
Microalb/Creat Ratio: 72 mg/g creat — ABNORMAL HIGH (ref 0–29)
Microalbumin, Urine: 68.6 ug/mL

## 2021-08-11 LAB — VITAMIN D 25 HYDROXY (VIT D DEFICIENCY, FRACTURES): Vit D, 25-Hydroxy: 32.7 ng/mL (ref 30.0–100.0)

## 2021-08-14 ENCOUNTER — Other Ambulatory Visit: Payer: Self-pay | Admitting: Nurse Practitioner

## 2021-08-14 DIAGNOSIS — E782 Mixed hyperlipidemia: Secondary | ICD-10-CM

## 2021-08-14 MED ORDER — ATORVASTATIN CALCIUM 20 MG PO TABS: 20.0000 mg | ORAL_TABLET | Freq: Every day | ORAL | 3 refills | Status: DC

## 2021-08-14 NOTE — Progress Notes (Signed)
LDL is not at goal of less than 70. Start taking atorvastatin '20mg'$  daily,avoid fried fatty foods .  She has proteinuria, its important that we get her diabetes under control, avoid sugar sweets soda. Maintain close follow up with endo, will discuss the need to add faxiga for kidney protection  at her next follw up appointment    Other labs are normal.  Please schedule an 8 week follow up , pt should have fasting labs done 3-5 days prior to that visit

## 2021-08-17 DIAGNOSIS — L97322 Non-pressure chronic ulcer of left ankle with fat layer exposed: Secondary | ICD-10-CM | POA: Diagnosis not present

## 2021-08-17 DIAGNOSIS — M869 Osteomyelitis, unspecified: Secondary | ICD-10-CM | POA: Diagnosis not present

## 2021-08-17 DIAGNOSIS — E11622 Type 2 diabetes mellitus with other skin ulcer: Secondary | ICD-10-CM | POA: Diagnosis not present

## 2021-08-31 DIAGNOSIS — E11622 Type 2 diabetes mellitus with other skin ulcer: Secondary | ICD-10-CM | POA: Diagnosis not present

## 2021-08-31 DIAGNOSIS — M869 Osteomyelitis, unspecified: Secondary | ICD-10-CM | POA: Diagnosis not present

## 2021-08-31 DIAGNOSIS — L97322 Non-pressure chronic ulcer of left ankle with fat layer exposed: Secondary | ICD-10-CM | POA: Diagnosis not present

## 2021-09-05 ENCOUNTER — Other Ambulatory Visit: Payer: Self-pay

## 2021-09-05 DIAGNOSIS — I10 Essential (primary) hypertension: Secondary | ICD-10-CM

## 2021-09-05 MED ORDER — LOSARTAN POTASSIUM-HCTZ 100-25 MG PO TABS: 1.0000 | ORAL_TABLET | Freq: Every day | ORAL | 1 refills | Status: DC

## 2021-09-19 DIAGNOSIS — L97322 Non-pressure chronic ulcer of left ankle with fat layer exposed: Secondary | ICD-10-CM | POA: Diagnosis not present

## 2021-09-19 DIAGNOSIS — E11622 Type 2 diabetes mellitus with other skin ulcer: Secondary | ICD-10-CM | POA: Diagnosis not present

## 2021-09-25 ENCOUNTER — Telehealth: Payer: Self-pay | Admitting: "Endocrinology

## 2021-09-25 ENCOUNTER — Encounter: Payer: Self-pay | Admitting: "Endocrinology

## 2021-09-25 ENCOUNTER — Ambulatory Visit (INDEPENDENT_AMBULATORY_CARE_PROVIDER_SITE_OTHER): Payer: Medicare Other | Admitting: "Endocrinology

## 2021-09-25 VITALS — BP 104/68 | HR 100 | Ht 67.0 in | Wt 174.8 lb

## 2021-09-25 DIAGNOSIS — I10 Essential (primary) hypertension: Secondary | ICD-10-CM | POA: Diagnosis not present

## 2021-09-25 DIAGNOSIS — E782 Mixed hyperlipidemia: Secondary | ICD-10-CM | POA: Diagnosis not present

## 2021-09-25 DIAGNOSIS — E1165 Type 2 diabetes mellitus with hyperglycemia: Secondary | ICD-10-CM | POA: Diagnosis not present

## 2021-09-25 DIAGNOSIS — E063 Autoimmune thyroiditis: Secondary | ICD-10-CM | POA: Diagnosis not present

## 2021-09-25 LAB — POCT GLYCOSYLATED HEMOGLOBIN (HGB A1C): HbA1c, POC (controlled diabetic range): 6.8 % (ref 0.0–7.0)

## 2021-09-25 NOTE — Progress Notes (Signed)
09/25/2021, 2:55 PM   Endocrinology follow-up note  Subjective:    Patient ID: ZYKERA ABELLA, female    DOB: [birth year].  MIAKODA MCMILLION is being seen in follow-up after she was seen in consultation for management of currently uncontrolled symptomatic diabetes requested by  Renee Rival, FNP.   Past Medical History:  Diagnosis Date   Cancer [facility])    history of leukemia   Community acquired pneumonia 05/26/2013   Diabetes mellitus without complication ([place])    Encounter for screening mammogram for malignant neoplasm of breast 03/26/2019   Hypercholesteremia    Hypertension    Neuropathy    Seasonal allergies     Past Surgical History:  Procedure Laterality Date   COLONOSCOPY  12/28/2011   Procedure: COLONOSCOPY;  Surgeon: Danie Binder, MD;  Location: [facility];  Service: Endoscopy;  Laterality: N/A;  9:30 AM-changed to 8:30 [name] to notify pt   LAPAROSCOPY     TUBAL LIGATION     TUNNELED VENOUS CATHETER PLACEMENT      Social History   Socioeconomic History   Marital status: Divorced    Spouse name: Not on file   Number of children: 2   Years of education: Not on file   Highest education level: 11th grade  Occupational History   Not on file  Tobacco Use   Smoking status: Every Day    Packs/day: 0.30    Years: 40.00    Total pack years: 12.00    Types: Cigarettes   Smokeless tobacco: Never   Tobacco comments:    Working on quitting  Substance and Sexual Activity   Alcohol use: No   Drug use: No   Sexual activity: Not Currently  Other Topics Concern   Not on file  Social History Narrative   Lives with 67 year old mother -caregiver    2 grown children   Son [name] in [place]    Daughter [name] in [place] with daughter [name]       Enjoys: shopping, play games       Diet: not as good a she would like, eats all food groups   Caffeine: 2  cups of coffee in the mornings, coke daily   Water: 3-4 cups daily       Wear seat belt    Smoke detectors at home   Does not use phone while driving      Social Determinants of Health   Financial Resource Strain: Low Risk  (06/05/2021)   Overall Financial Resource Strain (CARDIA)    Difficulty of Paying Living Expenses: Not hard at all  Food Insecurity: No Food Insecurity (06/05/2021)   Hunger Vital Sign    Worried About Running Out of Food in the Last Year: Never true    [place] in the Last Year: Never true  Transportation Needs: No Transportation Needs (06/05/2021)   PRAPARE - Hydrologist (Medical): No    Lack of Transportation (Non-Medical): No  Physical Activity: Inactive (06/05/2021)   Exercise Vital Sign  Days of Exercise per Week: 0 days    Minutes of Exercise per Session: 0 min  Stress: No Stress Concern Present (06/05/2021)   [place]    Feeling of Stress : Not at all  Social Connections: Socially Isolated (06/05/2021)   Social Connection and Isolation Panel [NHANES]    Frequency of Communication with Friends and Family: Three times a week    Frequency of Social Gatherings with Friends and Family: Three times a week    Attends Religious Services: Never    Active Member of Clubs or Organizations: No    Attends Archivist Meetings: Never    Marital Status: Divorced    Family History  Problem Relation Age of Onset   Hypothyroidism Mother    Diabetes Mother    Hypertension Mother    Thyroid disease Mother    Hypertension Father    Stroke Father    Colon cancer Neg Hx     Outpatient Encounter Medications as of 09/25/2021  Medication Sig   collagenase (SANTYL) ointment Apply 1 application topically daily.   silver sulfADIAZINE (SILVADENE) 1 % cream Apply 1 application  topically 2 (two) times daily as needed.   acetaminophen (TYLENOL) 500 MG tablet  Take 500 mg by mouth every 6 (six) hours as needed. Pain.   atorvastatin (LIPITOR) 20 MG tablet Take 1 tablet (20 mg total) by mouth daily.   Blood Glucose Monitoring Suppl (ACCU-CHEK GUIDE) w/Device KIT 1 Piece by Does not apply route as directed.   Cholecalciferol 50 MCG (2000 UT) CAPS Take 1 capsule (2,000 Units total) by mouth daily with breakfast.   GE100 BLOOD GLUCOSE TEST test strip USE AS DIRECTED TO TEST TWICE DAILY   glipiZIDE (GLUCOTROL XL) 5 MG 24 hr tablet TAKE ONE TABLET BY MOUTH DAILY WITH BREAKFAST   Lancets MISC 1 each by Does not apply route 4 (four) times daily. Use as directed to check blood glucose four times daily   losartan-hydrochlorothiazide (HYZAAR) 100-25 MG tablet Take 1 tablet by mouth daily.   metFORMIN (GLUCOPHAGE) 1000 MG tablet TAKE ONE TABLET BY MOUTH TWICE DAILY WITH A MEAL   omeprazole (PRILOSEC) 20 MG capsule Take 1 capsule (20 mg total) by mouth daily as needed. Heartburn   No facility-administered encounter medications on file as of 09/25/2021.    ALLERGIES: Allergies  Allergen Reactions   Sulfamethoxazole-Trimethoprim Itching and Rash    Rash all over body, itching    VACCINATION STATUS: Immunization History  Administered Date(s) Administered   Influenza, Quadrivalent, Recombinant, Inj, Pf 12/06/2020   Moderna SARS-COV2 Booster Vaccination 06/09/2020   Moderna Sars-Covid-2 Vaccination 09/13/2019, 10/11/2019, 12/06/2020   Pneumococcal Polysaccharide-23 11/03/2010, 05/27/2013, 06/23/2019   Tdap 08/12/2019    Diabetes She presents for her follow-up diabetic visit. She has type 2 diabetes mellitus. Onset time: She was diagnosed at approximate age of 26 years. Her disease course has been improving. There are no hypoglycemic associated symptoms. Pertinent negatives for hypoglycemia include no confusion, headaches, pallor or seizures. Associated symptoms include blurred vision and fatigue. Pertinent negatives for diabetes include no chest pain, no  polydipsia, no polyphagia and no polyuria. There are no hypoglycemic complications. Symptoms are improving. Diabetic complications include nephropathy. Risk factors for coronary artery disease include diabetes mellitus, dyslipidemia, hypertension, family history, tobacco exposure, sedentary lifestyle and post-menopausal. Current diabetic treatment includes oral agent (monotherapy). Her weight is fluctuating minimally. She is following a generally unhealthy diet. When asked about meal planning, she reported  none. She has not had a previous visit with a dietitian. She rarely (She is wheelchair-bound due to deconditioning/disequilibrium.  ) participates in exercise. Her home blood glucose trend is decreasing steadily. Her breakfast blood glucose range is generally 130-140 mg/dl. Her bedtime blood glucose range is generally 140-180 mg/dl. Her overall blood glucose range is 140-180 mg/dl. Hassan Rowan presents with near target glycemic profile.  Her point-of-care A1c is 6.8%, overall improving from 11.5%.    She did not document any hypoglycemia.    ) An ACE inhibitor/angiotensin II receptor blocker is being taken. Eye exam is current.  Hyperlipidemia This is a chronic problem. The current episode started more than 1 year ago. Exacerbating diseases include diabetes. Pertinent negatives include no chest pain, myalgias or shortness of breath. Current antihyperlipidemic treatment includes statins. Risk factors for coronary artery disease include diabetes mellitus, hypertension, dyslipidemia, family history, post-menopausal and a sedentary lifestyle.  Hypertension This is a chronic problem. The current episode started more than 1 year ago. Associated symptoms include blurred vision. Pertinent negatives include no chest pain, headaches, palpitations or shortness of breath. Risk factors for coronary artery disease include diabetes mellitus, dyslipidemia, family history, post-menopausal state, smoking/tobacco exposure and  sedentary lifestyle.     Review of Systems  Constitutional:  Positive for fatigue. Negative for chills, fever and unexpected weight change.  HENT:  Negative for trouble swallowing and voice change.   Eyes:  Positive for blurred vision. Negative for visual disturbance.  Respiratory:  Negative for cough, shortness of breath and wheezing.   Cardiovascular:  Negative for chest pain, palpitations and leg swelling.  Gastrointestinal:  Negative for diarrhea, nausea and vomiting.  Endocrine: Negative for cold intolerance, heat intolerance, polydipsia, polyphagia and polyuria.  Musculoskeletal:  Positive for gait problem. Negative for arthralgias and myalgias.       She is wheelchair-bound due to deconditioning/disequilibrium.  Skin:  Negative for color change, pallor, rash and wound.  Neurological:  Negative for seizures and headaches.  Psychiatric/Behavioral:  Negative for confusion and suicidal ideas.     Objective:       09/25/2021    2:03 PM 07/28/2021   11:13 AM 07/28/2021   10:43 AM  Vitals with BMI  Height 5' 7"     Weight 174 lbs 13 oz    BMI 24.09    Systolic 735 329 924  Diastolic 68 78 82  Pulse 268      BP 104/68   Pulse 100   Ht 5' 7"  (1.702 m)   Wt 174 lb 12.8 oz (79.3 kg)   BMI 27.38 kg/m   Wt Readings from Last 3 Encounters:  09/25/21 174 lb 12.8 oz (79.3 kg)  07/28/21 176 lb (79.8 kg)  07/21/21 179 lb (81.2 kg)      CMP ( most recent) CMP     Component Value Date/Time   NA 141 07/21/2021 0329   NA 142 05/15/2021 1158   K 4.4 07/21/2021 0329   CL 102 07/21/2021 0329   CO2 29 07/21/2021 0329   GLUCOSE 228 (H) 07/21/2021 0329   BUN 18 07/21/2021 0329   BUN 21 05/15/2021 1158   CREATININE 0.95 07/21/2021 0329   CALCIUM 10.0 07/21/2021 0329   PROT 6.9 07/21/2021 0329   PROT 7.1 05/15/2021 1158   ALBUMIN 4.5 05/15/2021 1158   AST 15 07/21/2021 0329   ALT 17 07/21/2021 0329   ALKPHOS 116 05/15/2021 1158   BILITOT 0.3 07/21/2021 0329   BILITOT <0.2  05/15/2021 1158   GFRNONAA  34 (L) 02/07/2021 1745   GFRNONAA 65 03/26/2019 1517   GFRAA 68 12/02/2019 1203   GFRAA 75 03/26/2019 1517    Diabetic Labs (most recent): Lab Results  Component Value Date   HGBA1C 6.8 09/25/2021   HGBA1C 7.3 (A) 02/22/2021   HGBA1C 7.2 (A) 11/08/2020    Lipid Panel     Component Value Date/Time   CHOL 141 08/09/2021 0937   TRIG 131 08/09/2021 0937   HDL 42 08/09/2021 0937   CHOLHDL 3.4 08/09/2021 0937   CHOLHDL 4.3 03/26/2019 1517   LDLCALC 76 08/09/2021 0937   LDLCALC 122 (H) 03/26/2019 1517   LABVLDL 23 08/09/2021 0937     Assessment & Plan:   1. Uncontrolled type 2 diabetes mellitus with hyperglycemia ([place])  - PRESLYN WARR has currently uncontrolled symptomatic type 2 DM since  66 years of age.   She presents her log which shows significantly improved glycemic profile to near target levels.   Balinda presents with near target glycemic profile.  Her point-of-care A1c is 6.8%, overall improving from 11.5%.    She did not document any hypoglycemia.    Recent labs reviewed. - I had a long discussion with her about the progressive nature of diabetes and the pathology behind its complications. -her diabetes is complicated by CKD, sedentary life, smoking and she remains at a high risk for more acute and chronic complications which include CAD, CVA, retinopathy, and neuropathy. These are all discussed in detail with her.  - I have counseled her on diet  and weight management  by adopting a carbohydrate restricted/protein rich diet. Patient is encouraged to switch to  unprocessed or minimally processed     complex starch and increased protein intake (animal or plant source), fruits, and vegetables. -  she is advised to stick to a routine mealtimes to eat 3 meals  a day and avoid unnecessary snacks ( to snack only to correct hypoglycemia).   - she acknowledges that there is a room for improvement in her food and drink choices. - Suggestion is made  for her to avoid simple carbohydrates  from her diet including Cakes, Sweet Desserts, Ice Cream, Soda (diet and regular), Sweet Tea, Candies, Chips, Cookies, Store Bought Juices, Alcohol , Artificial Sweeteners,  Coffee Creamer, and "Sugar-free" Products, Lemonade. This will help patient to have more stable blood glucose profile and potentially avoid unintended weight gain.  The following Lifestyle Medicine recommendations according to [place]  [facility]) were discussed and and offered to patient and she  agrees to start the journey:  A. Whole Foods, Plant-Based Nutrition comprising of fruits and vegetables, plant-based proteins, whole-grain carbohydrates was discussed in detail with the patient.   A list for source of those nutrients were also provided to the patient.  Patient will use only water or unsweetened tea for hydration. B.  The need to stay away from risky substances including alcohol, smoking; obtaining 7 to 9 hours of restorative sleep, at least 150 minutes of moderate intensity exercise weekly, the importance of healthy social connections,  and stress management techniques were discussed. C.  A full color page of  Calorie density of various food groups per pound showing examples of each food groups was provided to the patient.   - I have approached her with the following individualized plan to manage  her diabetes and patient agrees:   -Based on her presentation with near near target glycemic profile, she will not need insulin to metformin.  She is advised to continue metformin 1000 mg p.o. twice daily, glipizide 5 mg XL p.o. daily at breakfast.  She is willing to continue monitoring blood glucose twice a day-daily before breakfast  -  she is encouraged to call clinic for blood glucose levels less than 70 or greater than 200 mg/Dl at fasting.   - she is not a candidate for SGLT2 inhibitors nor incretin therapy.   - Specific targets for  A1c;  LDL, HDL,  and  Triglycerides were discussed with the patient.  2) Blood Pressure /Hypertension:  Her blood pressure is controlled to target. she is advised to continue her current medications including losartan/HCTZ 100/25 mg p.o. daily with breakfast .  3) Lipids/Hyperlipidemia:   Review of her recent lipid panel showed improved LDL to 72 overall improving from 122.  She is benefiting from statin intervention as well as her engagement with some aspects of lifestyle medicine.   She states that she has not taken atorvastatin for more than 1 year.  She will be considered for fasting lipid panel before next visit.    4)  Weight/Diet:  Body mass index is 27.38 kg/m.  -She is a candidate for some  weight loss.   I discussed with her the fact that loss of 5 - 10% of her  current body weight will have the most impact on her diabetes management.  Exercise, and detailed carbohydrates information provided  -  detailed on discharge instructions.  5) Chronic Care/Health Maintenance:  -she  is on ACEI/ARB and Statin medications and  is encouraged to initiate and continue to follow up with Ophthalmology, Dentist,  Podiatrist at least yearly or according to recommendations, and advised to  Quit smoking. I have recommended yearly flu vaccine and pneumonia vaccine at least every 5 years;  and  sleep for at least 7 hours a day.  She is wheelchair-bound, cannot exercise optimally.   6) subclinical hypothyroidism  Her previsit labs show euthroid state, but high anti-thyroid antibodies -a future risk for thyroid dysfunction. She needs TFTs measurements 2 x yearly. She will not need intervention with thyroid hormone at this time.  - she is  advised to maintain close follow up with Renee Rival, FNP for primary care needs, as well as her other providers for optimal and coordinated care.    I spent 32 minutes in the care of the patient today including review of labs from [place], Lipids, Thyroid Function, Hematology (current  and previous including abstractions from other facilities); face-to-face time discussing  her blood glucose readings/logs, discussing hypoglycemia and hyperglycemia episodes and symptoms, medications doses, her options of short and long term treatment based on the latest standards of care / guidelines;  discussion about incorporating lifestyle medicine;  and documenting the encounter. Risk reduction counseling performed per USPSTF guidelines to reduce obesity and cardiovascular risk factors.     Please refer to Patient Instructions for Blood Glucose Monitoring and Insulin/Medications Dosing Guide"  in media tab for additional information. Please  also refer to " Patient Self Inventory" in the Media  tab for reviewed elements of pertinent patient history.  [name] participated in the discussions, expressed understanding, and voiced agreement with the above plans.  All questions were answered to her satisfaction. she is encouraged to contact clinic should she have any questions or concerns prior to her return visit.    Follow up plan: - Return in about 6 months (around 03/28/2022) for F/U with Pre-visit Labs, Meter/CGM/Logs, A1c here.  Glade Lloyd, MD [facility] [address] [place], [place] [address] Phone: [phone]  Fax: [phone]    09/25/2021, 2:55 PM  This note was partially dictated with voice recognition software. Similar sounding words can be transcribed inadequately or may not  be corrected upon review.

## 2021-09-25 NOTE — Telephone Encounter (Signed)
New message    Patient wanted the nurse to know that she does take Lipid for her cholesterol.

## 2021-09-25 NOTE — Patient Instructions (Signed)

## 2021-09-26 NOTE — Telephone Encounter (Signed)
Pt's chart does document she is taking atorvastatin '20mg'$  daily.

## 2021-10-03 DIAGNOSIS — M869 Osteomyelitis, unspecified: Secondary | ICD-10-CM | POA: Diagnosis not present

## 2021-10-03 DIAGNOSIS — L97322 Non-pressure chronic ulcer of left ankle with fat layer exposed: Secondary | ICD-10-CM | POA: Diagnosis not present

## 2021-10-03 DIAGNOSIS — E11622 Type 2 diabetes mellitus with other skin ulcer: Secondary | ICD-10-CM | POA: Diagnosis not present

## 2021-10-05 DIAGNOSIS — Z8739 Personal history of other diseases of the musculoskeletal system and connective tissue: Secondary | ICD-10-CM | POA: Diagnosis not present

## 2021-10-05 DIAGNOSIS — Z01818 Encounter for other preprocedural examination: Secondary | ICD-10-CM | POA: Diagnosis not present

## 2021-10-05 DIAGNOSIS — M7989 Other specified soft tissue disorders: Secondary | ICD-10-CM | POA: Diagnosis not present

## 2021-10-06 DIAGNOSIS — Z79899 Other long term (current) drug therapy: Secondary | ICD-10-CM | POA: Diagnosis not present

## 2021-10-06 DIAGNOSIS — I10 Essential (primary) hypertension: Secondary | ICD-10-CM | POA: Diagnosis not present

## 2021-10-06 DIAGNOSIS — E7800 Pure hypercholesterolemia, unspecified: Secondary | ICD-10-CM | POA: Diagnosis not present

## 2021-10-06 DIAGNOSIS — L97322 Non-pressure chronic ulcer of left ankle with fat layer exposed: Secondary | ICD-10-CM | POA: Diagnosis not present

## 2021-10-06 DIAGNOSIS — E11622 Type 2 diabetes mellitus with other skin ulcer: Secondary | ICD-10-CM | POA: Diagnosis not present

## 2021-10-06 DIAGNOSIS — C9590 Leukemia, unspecified not having achieved remission: Secondary | ICD-10-CM | POA: Diagnosis not present

## 2021-10-06 DIAGNOSIS — Z7984 Long term (current) use of oral hypoglycemic drugs: Secondary | ICD-10-CM | POA: Diagnosis not present

## 2021-10-06 DIAGNOSIS — K219 Gastro-esophageal reflux disease without esophagitis: Secondary | ICD-10-CM | POA: Diagnosis not present

## 2021-10-06 DIAGNOSIS — L97329 Non-pressure chronic ulcer of left ankle with unspecified severity: Secondary | ICD-10-CM | POA: Diagnosis not present

## 2021-10-09 ENCOUNTER — Ambulatory Visit: Payer: Medicare Other | Admitting: Internal Medicine

## 2021-10-10 ENCOUNTER — Ambulatory Visit: Payer: Medicare Other | Admitting: Nurse Practitioner

## 2021-10-10 DIAGNOSIS — E11622 Type 2 diabetes mellitus with other skin ulcer: Secondary | ICD-10-CM | POA: Diagnosis not present

## 2021-10-10 DIAGNOSIS — L97322 Non-pressure chronic ulcer of left ankle with fat layer exposed: Secondary | ICD-10-CM | POA: Diagnosis not present

## 2021-10-17 DIAGNOSIS — E11622 Type 2 diabetes mellitus with other skin ulcer: Secondary | ICD-10-CM | POA: Diagnosis not present

## 2021-10-17 DIAGNOSIS — L97322 Non-pressure chronic ulcer of left ankle with fat layer exposed: Secondary | ICD-10-CM | POA: Diagnosis not present

## 2021-10-20 ENCOUNTER — Ambulatory Visit: Payer: Medicare Other | Admitting: Internal Medicine

## 2021-10-25 ENCOUNTER — Encounter: Payer: Self-pay | Admitting: Nurse Practitioner

## 2021-10-25 ENCOUNTER — Ambulatory Visit (INDEPENDENT_AMBULATORY_CARE_PROVIDER_SITE_OTHER): Payer: Medicare Other | Admitting: Nurse Practitioner

## 2021-10-25 VITALS — BP 132/78 | HR 103 | Ht 66.0 in | Wt 174.0 lb

## 2021-10-25 DIAGNOSIS — L97309 Non-pressure chronic ulcer of unspecified ankle with unspecified severity: Secondary | ICD-10-CM | POA: Diagnosis not present

## 2021-10-25 DIAGNOSIS — E1165 Type 2 diabetes mellitus with hyperglycemia: Secondary | ICD-10-CM

## 2021-10-25 DIAGNOSIS — E785 Hyperlipidemia, unspecified: Secondary | ICD-10-CM | POA: Diagnosis not present

## 2021-10-25 DIAGNOSIS — E11622 Type 2 diabetes mellitus with other skin ulcer: Secondary | ICD-10-CM | POA: Diagnosis not present

## 2021-10-25 DIAGNOSIS — E1169 Type 2 diabetes mellitus with other specified complication: Secondary | ICD-10-CM

## 2021-10-25 DIAGNOSIS — I10 Essential (primary) hypertension: Secondary | ICD-10-CM | POA: Diagnosis not present

## 2021-10-25 DIAGNOSIS — Z23 Encounter for immunization: Secondary | ICD-10-CM | POA: Diagnosis not present

## 2021-10-25 NOTE — Assessment & Plan Note (Addendum)
BP Readings from Last 3 Encounters:  10/25/21 132/78  09/25/21 104/68  07/28/21 130/78  Chronic medical condition well-controlled Continue losartan/hydrochlorothiazide 100-25 mg 1 tablet daily DASH diet advised engage in regular daily exercises at least 150 minutes weekly as tolerated Lab Results  Component Value Date   NA 141 07/21/2021   K 4.4 07/21/2021   CO2 29 07/21/2021   GLUCOSE 228 (H) 07/21/2021   BUN 18 07/21/2021   CREATININE 0.95 07/21/2021   CALCIUM 10.0 07/21/2021   EGFR 66 07/21/2021   GFRNONAA 34 (L) 02/07/2021  recent EGFR 66

## 2021-10-25 NOTE — Patient Instructions (Addendum)
Please get  your shingles vaccine at the pharmacy  Flu vaccine today    It is important that you exercise regularly at least 30 minutes 5 times a week as tolerated  Think about what you will eat, plan ahead. Choose " clean, green, fresh or frozen" over canned, processed or packaged foods which are more sugary, salty and fatty. 70 to 75% of food eaten should be vegetables and fruit. Three meals at set times with snacks allowed between meals, but they must be fruit or vegetables. Aim to eat over a 12 hour period , example 7 am to 7 pm, and STOP after  your last meal of the day. Drink water,generally about 64 ounces per day, no other drink is as healthy. Fruit juice is best enjoyed in a healthy way, by EATING the fruit.  Thanks for choosing [facility], we consider it a privelige to serve you.

## 2021-10-25 NOTE — Assessment & Plan Note (Signed)
Patient educated on CDC recommendation for the vaccine. Verbal consent was obtained from the patient, vaccine administered by nurse, no sign of adverse reactions noted at this time. Patient education on arm soreness and use of tylenol  for this patient  was discussed. Patient educated on the signs and symptoms of adverse effect and advise to contact the office if they occur. Vaccine information sheet given to patient.  

## 2021-10-25 NOTE — Assessment & Plan Note (Addendum)
Lab Results  Component Value Date   CHOL 141 08/09/2021   HDL 42 08/09/2021   LDLCALC 76 08/09/2021   TRIG 131 08/09/2021   CHOLHDL 3.4 08/09/2021  The 10-year ASCVD risk score (Arnett DK, et al., 2019) is: 29.6%   Values used to calculate the score:     Age: 66 years     Sex: Female     Is Non-Hispanic African American: No     Diabetic: Yes     Tobacco smoker: Yes     Systolic Blood Pressure: 110 mmHg     Is BP treated: Yes     HDL Cholesterol: 42 mg/dL     Total Cholesterol: 141 mg/dL  Currently on atorvastatin 20 mg daily LDL goal is less than 70 Check lipid panel

## 2021-10-25 NOTE — Progress Notes (Signed)
Established Patient Office Visit  Subjective   Patient ID: Kim [name], female    DOB: 1955/10/31  Age: 66 y.o. MRN: [id number]  Chief Complaint  Patient presents with   Follow-up    8 week follow up.   Kim [name] with past medical history of hypertension, type 2 diabetes, hyperlipidemia, COPD, subclinical hypothyroidism vitamin D deficiency is here for follow-up for hyperlipidemia.   Had debridement  of left ankle ulcer about 2 weeks ago, she does dressing changes daily at home denies fever, numbness, tingling, pain. Has upcoming appointment with the surgeon    Hyperlipidemia.  Currently on atorvastatin 20 mg daily she denies muscle aches.  Due for flu vaccine, flu vaccine given today    Review of Systems  Constitutional: Negative.  Negative for chills, fever and weight loss.  Respiratory: Negative.    Cardiovascular: Negative.  Negative for chest pain, palpitations and orthopnea.  Gastrointestinal:  Negative for heartburn, nausea and vomiting.  Musculoskeletal:  Negative for back pain, joint pain, myalgias and neck pain.  Psychiatric/Behavioral:  Negative for depression, hallucinations and suicidal ideas.       Objective:     BP 132/78   Pulse (!) 103   Ht 5' 6"  (1.676 m)   Wt 174 lb 0.6 oz (78.9 kg)   SpO2 93%   BMI 28.09 kg/m    Physical Exam Constitutional:      General: She is not in acute distress.    Appearance: She is not ill-appearing, toxic-appearing or diaphoretic.  Cardiovascular:     Rate and Rhythm: Normal rate and regular rhythm.     Pulses: Normal pulses.     Heart sounds: No murmur heard.    No friction rub. No gallop.  Pulmonary:     Effort: Pulmonary effort is normal. No respiratory distress.     Breath sounds: Normal breath sounds. No stridor. No wheezing, rhonchi or rales.  Chest:     Chest wall: No tenderness.  Musculoskeletal:        General: No swelling or tenderness.     Right lower leg: No edema.     Left lower leg: No edema.      Comments: Sitting in a wheelchair today  Skin:    General: Skin is warm and dry.     Capillary Refill: Capillary refill takes less than 2 seconds.     Comments: Left ankle wound dressing in place, dressing clean and dry   Neurological:     Mental Status: She is alert and oriented to person, place, and time.  Psychiatric:        Mood and Affect: Mood normal.        Behavior: Behavior normal.        Judgment: Judgment normal.      No results found for any visits on 10/25/21.    The 10-year ASCVD risk score (Arnett DK, et al., 2019) is: 28.1%    Assessment & Plan:   Problem List Items Addressed This Visit       Cardiovascular and Mediastinum   Essential hypertension, benign    BP Readings from Last 3 Encounters:  10/25/21 132/78  09/25/21 104/68  07/28/21 130/78  Chronic medical condition well-controlled Continue losartan/hydrochlorothiazide 100-25 mg 1 tablet daily DASH diet advised engage in regular daily exercises at least 150 minutes weekly as tolerated Lab Results  Component Value Date   NA 141 07/21/2021   K 4.4 07/21/2021   CO2 29 07/21/2021   GLUCOSE 228 (  H) 07/21/2021   BUN 18 07/21/2021   CREATININE 0.95 07/21/2021   CALCIUM 10.0 07/21/2021   EGFR 66 07/21/2021   GFRNONAA 34 (L) 02/07/2021  recent EGFR 66        Endocrine   Uncontrolled type 2 diabetes mellitus with hyperglycemia (HCC)    Lab Results  Component Value Date   HGBA1C 6.8 09/25/2021  Chronic medical condition well-controlled On glipizide 5 mg daily, metformin 1000 mg twice daily Continue current medication Avoid sugar sweets soda Appreciate collaboration with endocrinology      Hyperlipidemia associated with type 2 diabetes mellitus ([place])    Lab Results  Component Value Date   CHOL 141 08/09/2021   HDL 42 08/09/2021   LDLCALC 76 08/09/2021   TRIG 131 08/09/2021   CHOLHDL 3.4 08/09/2021  The 10-year ASCVD risk score (Arnett DK, et al., 2019) is: 29.6%   Values used to  calculate the score:     Age: 23 years     Sex: Female     Is Non-Hispanic African American: No     Diabetic: Yes     Tobacco smoker: Yes     Systolic Blood Pressure: 970 mmHg     Is BP treated: Yes     HDL Cholesterol: 42 mg/dL     Total Cholesterol: 141 mg/dL  Currently on atorvastatin 20 mg daily LDL goal is less than 70 Check lipid panel      Relevant Orders   Lipid Profile   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 Verbal consent was obtained from the patient, vaccine administered by nurse, no sign of adverse reactions noted at this time. Patient education on arm soreness and use of tylenol  for this patient  was discussed. Patient educated on the signs and symptoms of adverse effect and advise to contact the office if they occur. Vaccine information sheet given to patient.       Relevant Orders   Flu Vaccine QUAD High Dose(Fluad) (Completed)    Return in about 6 months (around 04/25/2022) for HTN/DM/HLD.    Renee Rival, FNP

## 2021-10-25 NOTE — Assessment & Plan Note (Signed)
Recently had I&D of her left ankle ulcer Doing well after surgery Patient encouraged to maintain close follow-up with podiatry

## 2021-10-25 NOTE — Assessment & Plan Note (Signed)
Lab Results  Component Value Date   HGBA1C 6.8 09/25/2021  Chronic medical condition well-controlled On glipizide 5 mg daily, metformin 1000 mg twice daily Continue current medication Avoid sugar sweets soda Appreciate collaboration with endocrinology

## 2021-10-31 DIAGNOSIS — L97322 Non-pressure chronic ulcer of left ankle with fat layer exposed: Secondary | ICD-10-CM | POA: Diagnosis not present

## 2021-10-31 DIAGNOSIS — E785 Hyperlipidemia, unspecified: Secondary | ICD-10-CM | POA: Diagnosis not present

## 2021-10-31 DIAGNOSIS — E1169 Type 2 diabetes mellitus with other specified complication: Secondary | ICD-10-CM | POA: Diagnosis not present

## 2021-10-31 DIAGNOSIS — E11622 Type 2 diabetes mellitus with other skin ulcer: Secondary | ICD-10-CM | POA: Diagnosis not present

## 2021-11-01 ENCOUNTER — Other Ambulatory Visit: Payer: Self-pay | Admitting: Nurse Practitioner

## 2021-11-01 DIAGNOSIS — E1169 Type 2 diabetes mellitus with other specified complication: Secondary | ICD-10-CM

## 2021-11-01 LAB — LIPID PANEL
Chol/HDL Ratio: 3.6 ratio (ref 0.0–4.4)
Cholesterol, Total: 137 mg/dL (ref 100–199)
HDL: 38 mg/dL — ABNORMAL LOW (ref >39)
LDL Chol Calc (NIH): 76 mg/dL (ref 0–99)
Triglycerides: 128 mg/dL (ref 0–149)
VLDL Cholesterol Cal: 23 mg/dL (ref 5–40)

## 2021-11-01 MED ORDER — ATORVASTATIN CALCIUM 40 MG PO TABS: 40.0000 mg | ORAL_TABLET | Freq: Every day | ORAL | 3 refills | Status: DC

## 2021-11-07 DIAGNOSIS — L97322 Non-pressure chronic ulcer of left ankle with fat layer exposed: Secondary | ICD-10-CM | POA: Diagnosis not present

## 2021-11-14 DIAGNOSIS — E11622 Type 2 diabetes mellitus with other skin ulcer: Secondary | ICD-10-CM | POA: Diagnosis not present

## 2021-11-14 DIAGNOSIS — L97322 Non-pressure chronic ulcer of left ankle with fat layer exposed: Secondary | ICD-10-CM | POA: Diagnosis not present

## 2021-11-23 ENCOUNTER — Other Ambulatory Visit: Payer: Self-pay | Admitting: "Endocrinology

## 2021-11-23 DIAGNOSIS — E1165 Type 2 diabetes mellitus with hyperglycemia: Secondary | ICD-10-CM

## 2021-12-14 ENCOUNTER — Encounter: Payer: Self-pay | Admitting: *Deleted

## 2021-12-14 DIAGNOSIS — E119 Type 2 diabetes mellitus without complications: Secondary | ICD-10-CM | POA: Diagnosis not present

## 2021-12-14 DIAGNOSIS — L97322 Non-pressure chronic ulcer of left ankle with fat layer exposed: Secondary | ICD-10-CM | POA: Diagnosis not present

## 2021-12-21 DIAGNOSIS — L97322 Non-pressure chronic ulcer of left ankle with fat layer exposed: Secondary | ICD-10-CM | POA: Diagnosis not present

## 2021-12-21 DIAGNOSIS — E11622 Type 2 diabetes mellitus with other skin ulcer: Secondary | ICD-10-CM | POA: Diagnosis not present

## 2021-12-28 DIAGNOSIS — E11622 Type 2 diabetes mellitus with other skin ulcer: Secondary | ICD-10-CM | POA: Diagnosis not present

## 2021-12-28 DIAGNOSIS — L97322 Non-pressure chronic ulcer of left ankle with fat layer exposed: Secondary | ICD-10-CM | POA: Diagnosis not present

## 2022-01-12 ENCOUNTER — Other Ambulatory Visit: Payer: Self-pay | Admitting: "Endocrinology

## 2022-01-12 DIAGNOSIS — Z23 Encounter for immunization: Secondary | ICD-10-CM | POA: Diagnosis not present

## 2022-01-15 ENCOUNTER — Encounter: Payer: Self-pay | Admitting: Family Medicine

## 2022-01-23 DIAGNOSIS — L97322 Non-pressure chronic ulcer of left ankle with fat layer exposed: Secondary | ICD-10-CM | POA: Diagnosis not present

## 2022-01-23 DIAGNOSIS — E11622 Type 2 diabetes mellitus with other skin ulcer: Secondary | ICD-10-CM | POA: Diagnosis not present

## 2022-01-30 ENCOUNTER — Ambulatory Visit: Payer: Medicare Other | Admitting: Family Medicine

## 2022-02-08 DIAGNOSIS — L97322 Non-pressure chronic ulcer of left ankle with fat layer exposed: Secondary | ICD-10-CM | POA: Diagnosis not present

## 2022-02-08 DIAGNOSIS — E11622 Type 2 diabetes mellitus with other skin ulcer: Secondary | ICD-10-CM | POA: Diagnosis not present

## 2022-02-16 ENCOUNTER — Other Ambulatory Visit: Payer: Self-pay

## 2022-02-16 DIAGNOSIS — I10 Essential (primary) hypertension: Secondary | ICD-10-CM

## 2022-02-16 MED ORDER — LOSARTAN POTASSIUM-HCTZ 100-25 MG PO TABS: 1.0000 | ORAL_TABLET | Freq: Every day | ORAL | 1 refills | Status: DC

## 2022-02-22 DIAGNOSIS — L97322 Non-pressure chronic ulcer of left ankle with fat layer exposed: Secondary | ICD-10-CM | POA: Diagnosis not present

## 2022-02-22 DIAGNOSIS — E11622 Type 2 diabetes mellitus with other skin ulcer: Secondary | ICD-10-CM | POA: Diagnosis not present

## 2022-03-08 DIAGNOSIS — L97322 Non-pressure chronic ulcer of left ankle with fat layer exposed: Secondary | ICD-10-CM | POA: Diagnosis not present

## 2022-03-08 DIAGNOSIS — E11622 Type 2 diabetes mellitus with other skin ulcer: Secondary | ICD-10-CM | POA: Diagnosis not present

## 2022-03-15 DIAGNOSIS — Z9889 Other specified postprocedural states: Secondary | ICD-10-CM | POA: Diagnosis not present

## 2022-03-15 DIAGNOSIS — M7989 Other specified soft tissue disorders: Secondary | ICD-10-CM | POA: Diagnosis not present

## 2022-03-15 DIAGNOSIS — Z01818 Encounter for other preprocedural examination: Secondary | ICD-10-CM | POA: Diagnosis not present

## 2022-03-15 DIAGNOSIS — E08621 Diabetes mellitus due to underlying condition with foot ulcer: Secondary | ICD-10-CM | POA: Diagnosis not present

## 2022-03-16 DIAGNOSIS — E11622 Type 2 diabetes mellitus with other skin ulcer: Secondary | ICD-10-CM | POA: Diagnosis not present

## 2022-03-16 DIAGNOSIS — I10 Essential (primary) hypertension: Secondary | ICD-10-CM | POA: Diagnosis not present

## 2022-03-16 DIAGNOSIS — L97322 Non-pressure chronic ulcer of left ankle with fat layer exposed: Secondary | ICD-10-CM | POA: Diagnosis not present

## 2022-03-16 DIAGNOSIS — L97329 Non-pressure chronic ulcer of left ankle with unspecified severity: Secondary | ICD-10-CM | POA: Diagnosis not present

## 2022-03-16 DIAGNOSIS — E11621 Type 2 diabetes mellitus with foot ulcer: Secondary | ICD-10-CM | POA: Diagnosis not present

## 2022-03-16 DIAGNOSIS — Z8673 Personal history of transient ischemic attack (TIA), and cerebral infarction without residual deficits: Secondary | ICD-10-CM | POA: Diagnosis not present

## 2022-03-16 DIAGNOSIS — K219 Gastro-esophageal reflux disease without esophagitis: Secondary | ICD-10-CM | POA: Diagnosis not present

## 2022-03-19 ENCOUNTER — Telehealth: Payer: Self-pay | Admitting: "Endocrinology

## 2022-03-19 DIAGNOSIS — I10 Essential (primary) hypertension: Secondary | ICD-10-CM

## 2022-03-19 DIAGNOSIS — E063 Autoimmune thyroiditis: Secondary | ICD-10-CM

## 2022-03-19 DIAGNOSIS — E782 Mixed hyperlipidemia: Secondary | ICD-10-CM

## 2022-03-19 DIAGNOSIS — E1165 Type 2 diabetes mellitus with hyperglycemia: Secondary | ICD-10-CM

## 2022-03-19 NOTE — Telephone Encounter (Signed)
Orders updated and sent to [facility]. 

## 2022-03-19 NOTE — Telephone Encounter (Signed)
Pt needs labs updated because pt will not be able to get them done before they expire.

## 2022-03-20 DIAGNOSIS — E11622 Type 2 diabetes mellitus with other skin ulcer: Secondary | ICD-10-CM | POA: Diagnosis not present

## 2022-03-20 DIAGNOSIS — L97322 Non-pressure chronic ulcer of left ankle with fat layer exposed: Secondary | ICD-10-CM | POA: Diagnosis not present

## 2022-03-22 DIAGNOSIS — Z7984 Long term (current) use of oral hypoglycemic drugs: Secondary | ICD-10-CM | POA: Diagnosis not present

## 2022-03-22 DIAGNOSIS — E11622 Type 2 diabetes mellitus with other skin ulcer: Secondary | ICD-10-CM | POA: Diagnosis not present

## 2022-03-22 DIAGNOSIS — L97322 Non-pressure chronic ulcer of left ankle with fat layer exposed: Secondary | ICD-10-CM | POA: Diagnosis not present

## 2022-03-25 DIAGNOSIS — Z7984 Long term (current) use of oral hypoglycemic drugs: Secondary | ICD-10-CM | POA: Diagnosis not present

## 2022-03-25 DIAGNOSIS — E11622 Type 2 diabetes mellitus with other skin ulcer: Secondary | ICD-10-CM | POA: Diagnosis not present

## 2022-03-25 DIAGNOSIS — L97322 Non-pressure chronic ulcer of left ankle with fat layer exposed: Secondary | ICD-10-CM | POA: Diagnosis not present

## 2022-03-27 DIAGNOSIS — E11622 Type 2 diabetes mellitus with other skin ulcer: Secondary | ICD-10-CM | POA: Diagnosis not present

## 2022-03-27 DIAGNOSIS — L97322 Non-pressure chronic ulcer of left ankle with fat layer exposed: Secondary | ICD-10-CM | POA: Diagnosis not present

## 2022-03-28 ENCOUNTER — Ambulatory Visit: Payer: Medicare Other | Admitting: "Endocrinology

## 2022-03-29 DIAGNOSIS — Z7984 Long term (current) use of oral hypoglycemic drugs: Secondary | ICD-10-CM | POA: Diagnosis not present

## 2022-03-29 DIAGNOSIS — E11622 Type 2 diabetes mellitus with other skin ulcer: Secondary | ICD-10-CM | POA: Diagnosis not present

## 2022-03-29 DIAGNOSIS — L97322 Non-pressure chronic ulcer of left ankle with fat layer exposed: Secondary | ICD-10-CM | POA: Diagnosis not present

## 2022-03-31 DIAGNOSIS — L97322 Non-pressure chronic ulcer of left ankle with fat layer exposed: Secondary | ICD-10-CM | POA: Diagnosis not present

## 2022-03-31 DIAGNOSIS — Z7984 Long term (current) use of oral hypoglycemic drugs: Secondary | ICD-10-CM | POA: Diagnosis not present

## 2022-03-31 DIAGNOSIS — E11622 Type 2 diabetes mellitus with other skin ulcer: Secondary | ICD-10-CM | POA: Diagnosis not present

## 2022-04-03 DIAGNOSIS — E11622 Type 2 diabetes mellitus with other skin ulcer: Secondary | ICD-10-CM | POA: Diagnosis not present

## 2022-04-03 DIAGNOSIS — L97322 Non-pressure chronic ulcer of left ankle with fat layer exposed: Secondary | ICD-10-CM | POA: Diagnosis not present

## 2022-04-05 DIAGNOSIS — E11622 Type 2 diabetes mellitus with other skin ulcer: Secondary | ICD-10-CM | POA: Diagnosis not present

## 2022-04-05 DIAGNOSIS — Z7984 Long term (current) use of oral hypoglycemic drugs: Secondary | ICD-10-CM | POA: Diagnosis not present

## 2022-04-05 DIAGNOSIS — L97322 Non-pressure chronic ulcer of left ankle with fat layer exposed: Secondary | ICD-10-CM | POA: Diagnosis not present

## 2022-04-07 DIAGNOSIS — Z7984 Long term (current) use of oral hypoglycemic drugs: Secondary | ICD-10-CM | POA: Diagnosis not present

## 2022-04-07 DIAGNOSIS — E11622 Type 2 diabetes mellitus with other skin ulcer: Secondary | ICD-10-CM | POA: Diagnosis not present

## 2022-04-07 DIAGNOSIS — L97322 Non-pressure chronic ulcer of left ankle with fat layer exposed: Secondary | ICD-10-CM | POA: Diagnosis not present

## 2022-04-10 DIAGNOSIS — E11622 Type 2 diabetes mellitus with other skin ulcer: Secondary | ICD-10-CM | POA: Diagnosis not present

## 2022-04-10 DIAGNOSIS — L97322 Non-pressure chronic ulcer of left ankle with fat layer exposed: Secondary | ICD-10-CM | POA: Diagnosis not present

## 2022-04-12 DIAGNOSIS — Z7984 Long term (current) use of oral hypoglycemic drugs: Secondary | ICD-10-CM | POA: Diagnosis not present

## 2022-04-12 DIAGNOSIS — L97322 Non-pressure chronic ulcer of left ankle with fat layer exposed: Secondary | ICD-10-CM | POA: Diagnosis not present

## 2022-04-12 DIAGNOSIS — E11622 Type 2 diabetes mellitus with other skin ulcer: Secondary | ICD-10-CM | POA: Diagnosis not present

## 2022-04-14 DIAGNOSIS — L97322 Non-pressure chronic ulcer of left ankle with fat layer exposed: Secondary | ICD-10-CM | POA: Diagnosis not present

## 2022-04-14 DIAGNOSIS — E11622 Type 2 diabetes mellitus with other skin ulcer: Secondary | ICD-10-CM | POA: Diagnosis not present

## 2022-04-14 DIAGNOSIS — Z7984 Long term (current) use of oral hypoglycemic drugs: Secondary | ICD-10-CM | POA: Diagnosis not present

## 2022-04-17 DIAGNOSIS — E11622 Type 2 diabetes mellitus with other skin ulcer: Secondary | ICD-10-CM | POA: Diagnosis not present

## 2022-04-17 DIAGNOSIS — L97322 Non-pressure chronic ulcer of left ankle with fat layer exposed: Secondary | ICD-10-CM | POA: Diagnosis not present

## 2022-04-19 DIAGNOSIS — E11622 Type 2 diabetes mellitus with other skin ulcer: Secondary | ICD-10-CM | POA: Diagnosis not present

## 2022-04-19 DIAGNOSIS — L97322 Non-pressure chronic ulcer of left ankle with fat layer exposed: Secondary | ICD-10-CM | POA: Diagnosis not present

## 2022-04-19 DIAGNOSIS — Z7984 Long term (current) use of oral hypoglycemic drugs: Secondary | ICD-10-CM | POA: Diagnosis not present

## 2022-04-21 DIAGNOSIS — E11622 Type 2 diabetes mellitus with other skin ulcer: Secondary | ICD-10-CM | POA: Diagnosis not present

## 2022-04-21 DIAGNOSIS — Z7984 Long term (current) use of oral hypoglycemic drugs: Secondary | ICD-10-CM | POA: Diagnosis not present

## 2022-04-21 DIAGNOSIS — L97322 Non-pressure chronic ulcer of left ankle with fat layer exposed: Secondary | ICD-10-CM | POA: Diagnosis not present

## 2022-04-24 DIAGNOSIS — L97322 Non-pressure chronic ulcer of left ankle with fat layer exposed: Secondary | ICD-10-CM | POA: Diagnosis not present

## 2022-04-24 DIAGNOSIS — E11622 Type 2 diabetes mellitus with other skin ulcer: Secondary | ICD-10-CM | POA: Diagnosis not present

## 2022-04-26 DIAGNOSIS — E11622 Type 2 diabetes mellitus with other skin ulcer: Secondary | ICD-10-CM | POA: Diagnosis not present

## 2022-04-26 DIAGNOSIS — L97322 Non-pressure chronic ulcer of left ankle with fat layer exposed: Secondary | ICD-10-CM | POA: Diagnosis not present

## 2022-04-26 DIAGNOSIS — Z7984 Long term (current) use of oral hypoglycemic drugs: Secondary | ICD-10-CM | POA: Diagnosis not present

## 2022-04-28 DIAGNOSIS — Z7984 Long term (current) use of oral hypoglycemic drugs: Secondary | ICD-10-CM | POA: Diagnosis not present

## 2022-04-28 DIAGNOSIS — E11622 Type 2 diabetes mellitus with other skin ulcer: Secondary | ICD-10-CM | POA: Diagnosis not present

## 2022-04-28 DIAGNOSIS — L97322 Non-pressure chronic ulcer of left ankle with fat layer exposed: Secondary | ICD-10-CM | POA: Diagnosis not present

## 2022-05-01 ENCOUNTER — Ambulatory Visit: Payer: Medicare Other | Admitting: Family Medicine

## 2022-05-01 DIAGNOSIS — E11622 Type 2 diabetes mellitus with other skin ulcer: Secondary | ICD-10-CM | POA: Diagnosis not present

## 2022-05-01 DIAGNOSIS — L97322 Non-pressure chronic ulcer of left ankle with fat layer exposed: Secondary | ICD-10-CM | POA: Diagnosis not present

## 2022-05-03 DIAGNOSIS — Z7984 Long term (current) use of oral hypoglycemic drugs: Secondary | ICD-10-CM | POA: Diagnosis not present

## 2022-05-03 DIAGNOSIS — E11622 Type 2 diabetes mellitus with other skin ulcer: Secondary | ICD-10-CM | POA: Diagnosis not present

## 2022-05-03 DIAGNOSIS — L97322 Non-pressure chronic ulcer of left ankle with fat layer exposed: Secondary | ICD-10-CM | POA: Diagnosis not present

## 2022-05-05 DIAGNOSIS — Z7984 Long term (current) use of oral hypoglycemic drugs: Secondary | ICD-10-CM | POA: Diagnosis not present

## 2022-05-05 DIAGNOSIS — L97322 Non-pressure chronic ulcer of left ankle with fat layer exposed: Secondary | ICD-10-CM | POA: Diagnosis not present

## 2022-05-05 DIAGNOSIS — E11622 Type 2 diabetes mellitus with other skin ulcer: Secondary | ICD-10-CM | POA: Diagnosis not present

## 2022-05-08 DIAGNOSIS — L97322 Non-pressure chronic ulcer of left ankle with fat layer exposed: Secondary | ICD-10-CM | POA: Diagnosis not present

## 2022-05-08 DIAGNOSIS — E11622 Type 2 diabetes mellitus with other skin ulcer: Secondary | ICD-10-CM | POA: Diagnosis not present

## 2022-05-10 DIAGNOSIS — E11622 Type 2 diabetes mellitus with other skin ulcer: Secondary | ICD-10-CM | POA: Diagnosis not present

## 2022-05-10 DIAGNOSIS — Z7984 Long term (current) use of oral hypoglycemic drugs: Secondary | ICD-10-CM | POA: Diagnosis not present

## 2022-05-10 DIAGNOSIS — L97322 Non-pressure chronic ulcer of left ankle with fat layer exposed: Secondary | ICD-10-CM | POA: Diagnosis not present

## 2022-05-11 ENCOUNTER — Other Ambulatory Visit: Payer: Self-pay | Admitting: Nurse Practitioner

## 2022-05-11 DIAGNOSIS — E1165 Type 2 diabetes mellitus with hyperglycemia: Secondary | ICD-10-CM

## 2022-05-12 DIAGNOSIS — L97322 Non-pressure chronic ulcer of left ankle with fat layer exposed: Secondary | ICD-10-CM | POA: Diagnosis not present

## 2022-05-12 DIAGNOSIS — Z7984 Long term (current) use of oral hypoglycemic drugs: Secondary | ICD-10-CM | POA: Diagnosis not present

## 2022-05-12 DIAGNOSIS — E11622 Type 2 diabetes mellitus with other skin ulcer: Secondary | ICD-10-CM | POA: Diagnosis not present

## 2022-05-15 DIAGNOSIS — L97322 Non-pressure chronic ulcer of left ankle with fat layer exposed: Secondary | ICD-10-CM | POA: Diagnosis not present

## 2022-05-15 DIAGNOSIS — E11622 Type 2 diabetes mellitus with other skin ulcer: Secondary | ICD-10-CM | POA: Diagnosis not present

## 2022-05-17 DIAGNOSIS — L97322 Non-pressure chronic ulcer of left ankle with fat layer exposed: Secondary | ICD-10-CM | POA: Diagnosis not present

## 2022-05-17 DIAGNOSIS — E11622 Type 2 diabetes mellitus with other skin ulcer: Secondary | ICD-10-CM | POA: Diagnosis not present

## 2022-05-17 DIAGNOSIS — Z7984 Long term (current) use of oral hypoglycemic drugs: Secondary | ICD-10-CM | POA: Diagnosis not present

## 2022-05-19 DIAGNOSIS — Z7984 Long term (current) use of oral hypoglycemic drugs: Secondary | ICD-10-CM | POA: Diagnosis not present

## 2022-05-19 DIAGNOSIS — L97322 Non-pressure chronic ulcer of left ankle with fat layer exposed: Secondary | ICD-10-CM | POA: Diagnosis not present

## 2022-05-19 DIAGNOSIS — E11622 Type 2 diabetes mellitus with other skin ulcer: Secondary | ICD-10-CM | POA: Diagnosis not present

## 2022-05-21 DIAGNOSIS — E11622 Type 2 diabetes mellitus with other skin ulcer: Secondary | ICD-10-CM | POA: Diagnosis not present

## 2022-05-21 DIAGNOSIS — L97322 Non-pressure chronic ulcer of left ankle with fat layer exposed: Secondary | ICD-10-CM | POA: Diagnosis not present

## 2022-05-21 DIAGNOSIS — Z7984 Long term (current) use of oral hypoglycemic drugs: Secondary | ICD-10-CM | POA: Diagnosis not present

## 2022-05-25 ENCOUNTER — Ambulatory Visit (INDEPENDENT_AMBULATORY_CARE_PROVIDER_SITE_OTHER): Payer: Medicare Other | Admitting: Family Medicine

## 2022-05-25 ENCOUNTER — Encounter: Payer: Self-pay | Admitting: Family Medicine

## 2022-05-25 VITALS — BP 132/78 | HR 110 | Ht 66.0 in | Wt 167.0 lb

## 2022-05-25 DIAGNOSIS — R7301 Impaired fasting glucose: Secondary | ICD-10-CM | POA: Diagnosis not present

## 2022-05-25 DIAGNOSIS — E782 Mixed hyperlipidemia: Secondary | ICD-10-CM | POA: Diagnosis not present

## 2022-05-25 DIAGNOSIS — E0789 Other specified disorders of thyroid: Secondary | ICD-10-CM

## 2022-05-25 DIAGNOSIS — E559 Vitamin D deficiency, unspecified: Secondary | ICD-10-CM

## 2022-05-25 DIAGNOSIS — E1169 Type 2 diabetes mellitus with other specified complication: Secondary | ICD-10-CM

## 2022-05-25 DIAGNOSIS — E785 Hyperlipidemia, unspecified: Secondary | ICD-10-CM

## 2022-05-25 DIAGNOSIS — I10 Essential (primary) hypertension: Secondary | ICD-10-CM | POA: Diagnosis not present

## 2022-05-25 DIAGNOSIS — K219 Gastro-esophageal reflux disease without esophagitis: Secondary | ICD-10-CM | POA: Diagnosis not present

## 2022-05-25 DIAGNOSIS — Z1211 Encounter for screening for malignant neoplasm of colon: Secondary | ICD-10-CM

## 2022-05-25 DIAGNOSIS — E1165 Type 2 diabetes mellitus with hyperglycemia: Secondary | ICD-10-CM | POA: Diagnosis not present

## 2022-05-25 DIAGNOSIS — Z1159 Encounter for screening for other viral diseases: Secondary | ICD-10-CM

## 2022-05-25 DIAGNOSIS — E063 Autoimmune thyroiditis: Secondary | ICD-10-CM | POA: Diagnosis not present

## 2022-05-25 NOTE — Progress Notes (Unsigned)
Established Patient Office Visit  Subjective:  Patient ID: Kim [name], female    DOB: 07/15/1955  Age: 67 y.o. MRN: [id number]  CC:  Chief Complaint  Patient presents with   Follow-up    6 month f/u for htn, dm, and cholesterol.     HPI Kim [name] is a 67 y.o. female with past medical history of HTN, DM, HLP and GERD presents for f/u of  chronic medical conditions. For the details of today's visit, please refer to the assessment and plan.     Past Medical History:  Diagnosis Date   Cancer    history of leukemia   Community acquired pneumonia 05/26/2013   Diabetes mellitus without complication    Encounter for screening mammogram for malignant neoplasm of breast 03/26/2019   Hypercholesteremia    Hypertension    Neuropathy    Seasonal allergies     Past Surgical History:  Procedure Laterality Date   COLONOSCOPY  12/28/2011   Procedure: COLONOSCOPY;  Surgeon: West Bali, MD;  Location: [facility];  Service: Endoscopy;  Laterality: N/A;  9:30 AM-changed to 8:30 [name] to notify pt   LAPAROSCOPY     TUBAL LIGATION     TUNNELED VENOUS CATHETER PLACEMENT      Family History  Problem Relation Age of Onset   Hypothyroidism Mother    Diabetes Mother    Hypertension Mother    Thyroid disease Mother    Hypertension Father    Stroke Father    Colon cancer Neg Hx     Social History   Socioeconomic History   Marital status: Divorced    Spouse name: Not on file   Number of children: 2   Years of education: Not on file   Highest education level: 11th grade  Occupational History   Not on file  Tobacco Use   Smoking status: Every Day    Packs/day: 0.30    Years: 40.00    Additional pack years: 0.00    Total pack years: 12.00    Types: Cigarettes   Smokeless tobacco: Never   Tobacco comments:    Working on quitting  Substance and Sexual Activity   Alcohol use: No   Drug use: No   Sexual activity: Not Currently  Other Topics Concern   Not on file   Social History Narrative   Lives with 61 year old mother -caregiver    2 grown children   Son Perley Jain in [place]    Daughter Victorino Dike in [place] with daughter Maralyn Sago       Enjoys: shopping, play games       Diet: not as good a she would like, eats all food groups   Caffeine: 2 cups of coffee in the mornings, coke daily   Water: 3-4 cups daily       Wear seat belt    Smoke detectors at home   Does not use phone while driving      Social Determinants of Health   Financial Resource Strain: Low Risk  (06/05/2021)   Overall Financial Resource Strain (CARDIA)    Difficulty of Paying Living Expenses: Not hard at all  Food Insecurity: No Food Insecurity (06/05/2021)   Hunger Vital Sign    Worried About Running Out of Food in the Last Year: Never true    Ran Out of Food in the Last Year: Never true  Transportation Needs: No Transportation Needs (06/05/2021)   PRAPARE - Transportation  Lack of Transportation (Medical): No    Lack of Transportation (Non-Medical): No  Physical Activity: Inactive (06/05/2021)   Exercise Vital Sign    Days of Exercise per Week: 0 days    Minutes of Exercise per Session: 0 min  Stress: No Stress Concern Present (06/05/2021)   [employer] of Occupational Health - Occupational Stress Questionnaire    Feeling of Stress : Not at all  Social Connections: Socially Isolated (06/05/2021)   Social Connection and Isolation Panel [NHANES]    Frequency of Communication with Friends and Family: Three times a week    Frequency of Social Gatherings with Friends and Family: Three times a week    Attends Religious Services: Never    Active Member of Clubs or Organizations: No    Attends Banker Meetings: Never    Marital Status: Divorced  Catering manager Violence: Not At Risk (06/01/2020)   Humiliation, Afraid, Rape, and Kick questionnaire    Fear of Current or Ex-Partner: No    Emotionally Abused: No    Physically Abused: No     Sexually Abused: No    Outpatient Medications Prior to Visit  Medication Sig Dispense Refill   acetaminophen (TYLENOL) 500 MG tablet Take 500 mg by mouth every 6 (six) hours as needed. Pain.     atorvastatin (LIPITOR) 40 MG tablet Take 1 tablet (40 mg total) by mouth daily. 90 tablet 3   Blood Glucose Monitoring Suppl (ACCU-CHEK GUIDE) w/Device KIT 1 Piece by Does not apply route as directed. 1 kit 0   Cholecalciferol 50 MCG (2000 UT) CAPS Take 1 capsule (2,000 Units total) by mouth daily with breakfast. 90 capsule 1   collagenase (SANTYL) ointment Apply 1 application topically daily. 30 g 2   GE100 BLOOD GLUCOSE TEST test strip USE AS DIRECTED TO TEST TWICE DAILY 200 strip 2   glipiZIDE (GLUCOTROL XL) 5 MG 24 hr tablet TAKE ONE TABLET BY MOUTH DAILY WITH BREAKFAST 30 tablet 0   Lancets MISC 1 each by Does not apply route 4 (four) times daily. Use as directed to check blood glucose four times daily 200 each 1   losartan-hydrochlorothiazide (HYZAAR) 100-25 MG tablet Take 1 tablet by mouth daily. 90 tablet 1   metFORMIN (GLUCOPHAGE) 1000 MG tablet TAKE ONE TABLET BY MOUTH TWICE DAILY WITH MEALS 60 tablet 0   omeprazole (PRILOSEC) 20 MG capsule Take 1 capsule (20 mg total) by mouth daily as needed. Heartburn 30 capsule 3   silver sulfADIAZINE (SILVADENE) 1 % cream Apply 1 application  topically 2 (two) times daily as needed.     No facility-administered medications prior to visit.    Allergies  Allergen Reactions   Sulfamethoxazole-Trimethoprim Itching and Rash    Rash all over body, itching    ROS Review of Systems  Constitutional:  Negative for chills and fever.  Eyes:  Negative for visual disturbance.  Respiratory:  Negative for chest tightness and shortness of breath.   Neurological:  Negative for dizziness and headaches.      Objective:    Physical Exam HENT:     Head: Normocephalic.     Mouth/Throat:     Mouth: Mucous membranes are moist.  Cardiovascular:     Rate and  Rhythm: Normal rate.     Heart sounds: Normal heart sounds.  Pulmonary:     Effort: Pulmonary effort is normal.     Breath sounds: Normal breath sounds.  Neurological:     Mental Status: She is alert.  BP 132/78   Pulse (!) 110   Ht  (1.676 m)   Wt 167 lb 0.6 oz (75.8 kg)   SpO2 93%   BMI 26.96 kg/m  Wt Readings from Last 3 Encounters:  05/25/22 167 lb 0.6 oz (75.8 kg)  10/25/21 174 lb 0.6 oz (78.9 kg)  09/25/21 174 lb 12.8 oz (79.3 kg)    Lab Results  Component Value Date   TSH 4.170 05/25/2022   Lab Results  Component Value Date   WBC 8.5 05/25/2022   HGB 13.0 05/25/2022   HCT 39.6 05/25/2022   MCV 86 05/25/2022   PLT 392 05/25/2022   Lab Results  Component Value Date   NA 140 05/25/2022   K 4.6 05/25/2022   CO2 23 05/25/2022   GLUCOSE 169 (H) 05/25/2022   BUN 25 05/25/2022   CREATININE 0.98 05/25/2022   BILITOT 0.3 05/25/2022   ALKPHOS 143 (H) 05/25/2022   AST 17 05/25/2022   ALT 19 05/25/2022   PROT 7.2 05/25/2022   ALBUMIN 4.0 05/25/2022   CALCIUM 9.9 05/25/2022   ANIONGAP 12 02/07/2021   EGFR 63 05/25/2022   Lab Results  Component Value Date   CHOL 125 05/25/2022   Lab Results  Component Value Date   HDL 39 (L) 05/25/2022   Lab Results  Component Value Date   LDLCALC 61 05/25/2022   Lab Results  Component Value Date   TRIG 143 05/25/2022   Lab Results  Component Value Date   CHOLHDL 3.2 05/25/2022   Lab Results  Component Value Date   HGBA1C 7.0 (H) 05/25/2022      Assessment & Plan:  Essential hypertension, benign Assessment & Plan: Controlled She takes losarta-hydrochlorothiazide 125 mg daily Denies headaches, dizziness, blurred vision, chest pain, palpitation Low-sodium diet with increased physical activity encouraged BP Readings from Last 3 Encounters:  05/25/22 132/78  10/25/21 132/78  09/25/21 104/68     Orders: -     CMP14+EGFR -     CBC with Differential/Platelet  Uncontrolled type 2 diabetes  mellitus with hyperglycemia Assessment & Plan: She takes glipizide 5 mg daily metformin 1000 mg twice daily Denies polyuria, polyphagia, polydipsia Pending hemoglobin A1c today Lab Results  Component Value Date   HGBA1C 7.0 (H) 05/25/2022     Orders: -     HM Diabetes Foot Exam  Gastroesophageal reflux disease without esophagitis Assessment & Plan: She takes omeprazole 20 mg daily No reports of gastric reflux or heartburn reported Encouraged to continue therapy   Hyperlipidemia associated with type 2 diabetes mellitus Assessment & Plan: She takes atorvastatin 40 mg daily Denies muscle aches and pains Pending lipid panel today Lab Results  Component Value Date   CHOL 125 05/25/2022   HDL 39 (L) 05/25/2022   LDLCALC 61 05/25/2022   TRIG 143 05/25/2022   CHOLHDL 3.2 05/25/2022      Vitamin D deficiency -     VITAMIN D 25 Hydroxy (Vit-D Deficiency, Fractures)  Mixed hyperlipidemia -     Lipid panel  Other specified disorders of thyroid -     TSH + free T4  Impaired fasting blood sugar -     Hemoglobin A1c  Encounter for hepatitis C screening test for low risk patient -     Hepatitis C antibody  Colon cancer screening -     Cologuard   Note: This chart has been completed using Engineer, civil (consulting) software, and while attempts have been made to ensure accuracy, certain words and  phrases may not be transcribed as intended.    Follow-up: Return in about 3 months (around 08/24/2022).   Gilmore Laroche, FNP

## 2022-05-25 NOTE — Patient Instructions (Addendum)
  I appreciate the opportunity to provide care to you today!    Follow up:  3 months  Labs: please stop by the lab today to get your blood drawn (CBC, CMP, TSH, Lipid profile, HgA1c, Vit D)  Please schedule your yearly Annual Eye Exam. [facility] Dr. Jonita Albee [place] [phone]  Please complete your COLOGUARD KIT & MAIL IT BACK    Please continue to a heart-healthy diet and increase your physical activities. Try to exercise for at least five days a week.      It was a pleasure to see you and I look forward to continuing to work together on your health and well-being. Please do not hesitate to call the office if you need care or have questions about your care.   Have a wonderful day and week. With Gratitude, Gilmore Laroche MSN, FNP-BC

## 2022-05-26 DIAGNOSIS — K219 Gastro-esophageal reflux disease without esophagitis: Secondary | ICD-10-CM | POA: Insufficient documentation

## 2022-05-26 LAB — COMPREHENSIVE METABOLIC PANEL
ALT: 19 IU/L (ref 0–32)
AST: 17 IU/L (ref 0–40)
Albumin/Globulin Ratio: 1.3 (ref 1.2–2.2)
Albumin: 4 g/dL (ref 3.9–4.9)
Alkaline Phosphatase: 143 IU/L — ABNORMAL HIGH (ref 44–121)
BUN/Creatinine Ratio: 26 (ref 12–28)
BUN: 25 mg/dL (ref 8–27)
Bilirubin Total: 0.3 mg/dL (ref 0.0–1.2)
CO2: 23 mmol/L (ref 20–29)
Calcium: 9.9 mg/dL (ref 8.7–10.3)
Chloride: 96 mmol/L (ref 96–106)
Creatinine, Ser: 0.98 mg/dL (ref 0.57–1.00)
Globulin, Total: 3.2 g/dL (ref 1.5–4.5)
Glucose: 169 mg/dL — ABNORMAL HIGH (ref 70–99)
Potassium: 4.6 mmol/L (ref 3.5–5.2)
Sodium: 140 mmol/L (ref 134–144)
Total Protein: 7.2 g/dL (ref 6.0–8.5)
eGFR: 63 mL/min/{1.73_m2} (ref >59)

## 2022-05-26 LAB — CBC WITH DIFFERENTIAL/PLATELET
Basophils Absolute: 0.1 10*3/uL (ref 0.0–0.2)
Basos: 1 %
EOS (ABSOLUTE): 0.2 10*3/uL (ref 0.0–0.4)
Eos: 3 %
Hematocrit: 39.6 % (ref 34.0–46.6)
Hemoglobin: 13 g/dL (ref 11.1–15.9)
Immature Grans (Abs): 0 10*3/uL (ref 0.0–0.1)
Immature Granulocytes: 0 %
Lymphocytes Absolute: 2.1 10*3/uL (ref 0.7–3.1)
Lymphs: 25 %
MCH: 28.2 pg (ref 26.6–33.0)
MCHC: 32.8 g/dL (ref 31.5–35.7)
MCV: 86 fL (ref 79–97)
Monocytes Absolute: 0.4 10*3/uL (ref 0.1–0.9)
Monocytes: 5 %
Neutrophils Absolute: 5.7 10*3/uL (ref 1.4–7.0)
Neutrophils: 66 %
Platelets: 392 10*3/uL (ref 150–450)
RBC: 4.61 x10E6/uL (ref 3.77–5.28)
RDW: 13.4 % (ref 11.7–15.4)
WBC: 8.5 10*3/uL (ref 3.4–10.8)

## 2022-05-26 LAB — CMP14+EGFR
ALT: 18 IU/L (ref 0–32)
AST: 17 IU/L (ref 0–40)
Albumin/Globulin Ratio: 1.4 (ref 1.2–2.2)
Albumin: 4 g/dL (ref 3.9–4.9)
Alkaline Phosphatase: 136 IU/L — ABNORMAL HIGH (ref 44–121)
BUN/Creatinine Ratio: 28 (ref 12–28)
BUN: 24 mg/dL (ref 8–27)
Bilirubin Total: 0.3 mg/dL (ref 0.0–1.2)
CO2: 22 mmol/L (ref 20–29)
Calcium: 9.7 mg/dL (ref 8.7–10.3)
Chloride: 97 mmol/L (ref 96–106)
Creatinine, Ser: 0.86 mg/dL (ref 0.57–1.00)
Globulin, Total: 2.8 g/dL (ref 1.5–4.5)
Glucose: 148 mg/dL — ABNORMAL HIGH (ref 70–99)
Potassium: 4.6 mmol/L (ref 3.5–5.2)
Sodium: 141 mmol/L (ref 134–144)
Total Protein: 6.8 g/dL (ref 6.0–8.5)
eGFR: 74 mL/min/{1.73_m2} (ref >59)

## 2022-05-26 LAB — T4, FREE: Free T4: 1.28 ng/dL (ref 0.82–1.77)

## 2022-05-26 LAB — LIPID PANEL
Chol/HDL Ratio: 3.2 ratio (ref 0.0–4.4)
Chol/HDL Ratio: 3.3 ratio (ref 0.0–4.4)
Cholesterol, Total: 124 mg/dL (ref 100–199)
Cholesterol, Total: 125 mg/dL (ref 100–199)
HDL: 38 mg/dL — ABNORMAL LOW (ref >39)
HDL: 39 mg/dL — ABNORMAL LOW (ref >39)
LDL Chol Calc (NIH): 61 mg/dL (ref 0–99)
LDL Chol Calc (NIH): 63 mg/dL (ref 0–99)
Triglycerides: 130 mg/dL (ref 0–149)
Triglycerides: 143 mg/dL (ref 0–149)
VLDL Cholesterol Cal: 23 mg/dL (ref 5–40)
VLDL Cholesterol Cal: 25 mg/dL (ref 5–40)

## 2022-05-26 LAB — TSH+FREE T4
Free T4: 1.37 ng/dL (ref 0.82–1.77)
TSH: 4.13 u[IU]/mL (ref 0.450–4.500)

## 2022-05-26 LAB — VITAMIN D 25 HYDROXY (VIT D DEFICIENCY, FRACTURES): Vit D, 25-Hydroxy: 59.6 ng/mL (ref 30.0–100.0)

## 2022-05-26 LAB — HEMOGLOBIN A1C
Est. average glucose Bld gHb Est-mCnc: 154 mg/dL
Hgb A1c MFr Bld: 7 % — ABNORMAL HIGH (ref 4.8–5.6)

## 2022-05-26 LAB — HEPATITIS C ANTIBODY: Hep C Virus Ab: NONREACTIVE

## 2022-05-26 LAB — TSH: TSH: 4.17 u[IU]/mL (ref 0.450–4.500)

## 2022-05-26 NOTE — Assessment & Plan Note (Signed)
She takes glipizide 5 mg daily metformin 1000 mg twice daily Denies polyuria, polyphagia, polydipsia Pending hemoglobin A1c today Lab Results  Component Value Date   HGBA1C 7.0 (H) 05/25/2022

## 2022-05-26 NOTE — Assessment & Plan Note (Signed)
She takes atorvastatin 40 mg daily Denies muscle aches and pains Pending lipid panel today Lab Results  Component Value Date   CHOL 125 05/25/2022   HDL 39 (L) 05/25/2022   LDLCALC 61 05/25/2022   TRIG 143 05/25/2022   CHOLHDL 3.2 05/25/2022

## 2022-05-26 NOTE — Assessment & Plan Note (Signed)
Controlled She takes losarta-hydrochlorothiazide 125 mg daily Denies headaches, dizziness, blurred vision, chest pain, palpitation Low-sodium diet with increased physical activity encouraged BP Readings from Last 3 Encounters:  05/25/22 132/78  10/25/21 132/78  09/25/21 104/68

## 2022-05-26 NOTE — Assessment & Plan Note (Signed)
She takes omeprazole 20 mg daily No reports of gastric reflux or heartburn reported Encouraged to continue therapy

## 2022-05-31 ENCOUNTER — Other Ambulatory Visit: Payer: Self-pay | Admitting: Family Medicine

## 2022-05-31 DIAGNOSIS — L97322 Non-pressure chronic ulcer of left ankle with fat layer exposed: Secondary | ICD-10-CM | POA: Diagnosis not present

## 2022-05-31 DIAGNOSIS — E11622 Type 2 diabetes mellitus with other skin ulcer: Secondary | ICD-10-CM | POA: Diagnosis not present

## 2022-05-31 DIAGNOSIS — M869 Osteomyelitis, unspecified: Secondary | ICD-10-CM | POA: Diagnosis not present

## 2022-05-31 DIAGNOSIS — E1165 Type 2 diabetes mellitus with hyperglycemia: Secondary | ICD-10-CM

## 2022-05-31 MED ORDER — RYBELSUS 3 MG PO TABS: 3.0000 mg | ORAL_TABLET | Freq: Every day | ORAL | 3 refills | Status: DC

## 2022-06-01 DIAGNOSIS — Z1211 Encounter for screening for malignant neoplasm of colon: Secondary | ICD-10-CM | POA: Diagnosis not present

## 2022-06-05 ENCOUNTER — Ambulatory Visit (INDEPENDENT_AMBULATORY_CARE_PROVIDER_SITE_OTHER): Payer: Medicare Other | Admitting: Family Medicine

## 2022-06-05 ENCOUNTER — Encounter: Payer: Self-pay | Admitting: Family Medicine

## 2022-06-05 VITALS — BP 133/77 | HR 107 | Ht 66.0 in | Wt 167.0 lb

## 2022-06-05 DIAGNOSIS — R29898 Other symptoms and signs involving the musculoskeletal system: Secondary | ICD-10-CM | POA: Diagnosis not present

## 2022-06-05 NOTE — Progress Notes (Addendum)
Acute Office Visit  Subjective:    Patient ID: Kim [name], female    DOB: August 21, 1955, 67 y.o.   MRN: [id number]  Chief Complaint  Patient presents with   Gait Problem    DME- electric wheelchair   Form Completion    DMV    HPI Patient is in today requesting an electrical wheelchair and DMV forms completion. The Main reason for this visit is to conduct a mobility examination. For the details of today's visit, please refer to the assessment and plan.    Past Medical History:  Diagnosis Date   Cancer [facility])    history of leukemia   Community acquired pneumonia 05/26/2013   Diabetes mellitus without complication (HCC)    Encounter for screening mammogram for malignant neoplasm of breast 03/26/2019   Hypercholesteremia    Hypertension    Neuropathy    Seasonal allergies     Past Surgical History:  Procedure Laterality Date   COLONOSCOPY  12/28/2011   Procedure: COLONOSCOPY;  Surgeon: West Bali, MD;  [facility];  Service: Endoscopy;  Laterality: N/A;  9:30 AM-changed to 8:30 [name] to notify pt   LAPAROSCOPY     TUBAL LIGATION     TUNNELED VENOUS CATHETER PLACEMENT      Family History  Problem Relation Age of Onset   Hypothyroidism Mother    Diabetes Mother    Hypertension Mother    Thyroid disease Mother    Hypertension Father    Stroke Father    Colon cancer Neg Hx     Social History   Socioeconomic History   Marital status: Divorced    Spouse name: Not on file   Number of children: 2   Years of education: Not on file   Highest education level: 11th grade  Occupational History   Not on file  Tobacco Use   Smoking status: Every Day    Packs/day: 0.30    Years: 40.00    Additional pack years: 0.00    Total pack years: 12.00    Types: Cigarettes   Smokeless tobacco: Never   Tobacco comments:    Working on quitting  Substance and Sexual Activity   Alcohol use: No   Drug use: No   Sexual activity: Not Currently  Other Topics Concern    Not on file  Social History Narrative   Lives with 69 year old mother -caregiver    2 grown children   Son Perley Jain in [place]    Daughter Victorino Dike in [place] with daughter Maralyn Sago       Enjoys: shopping, play games       Diet: not as good a she would like, eats all food groups   Caffeine: 2 cups of coffee in the mornings, coke daily   Water: 3-4 cups daily       Wear seat belt    Smoke detectors at home   Does not use phone while driving      Social Determinants of Health   Financial Resource Strain: Low Risk  (06/05/2021)   Overall Financial Resource Strain (CARDIA)    Difficulty of Paying Living Expenses: Not hard at all  Food Insecurity: No Food Insecurity (06/05/2021)   Hunger Vital Sign    Worried About Running Out of Food in the Last Year: Never true    Ran Out of Food in the Last Year: Never true  Transportation Needs: No Transportation Needs (06/05/2021)   PRAPARE - Transportation  Lack of Transportation (Medical): No    Lack of Transportation (Non-Medical): No  Physical Activity: Inactive (06/05/2021)   Exercise Vital Sign    Days of Exercise per Week: 0 days    Minutes of Exercise per Session: 0 min  Stress: No Stress Concern Present (06/05/2021)   [employer] of Occupational Health - Occupational Stress Questionnaire    Feeling of Stress : Not at all  Social Connections: Socially Isolated (06/05/2021)   Social Connection and Isolation Panel [NHANES]    Frequency of Communication with Friends and Family: Three times a week    Frequency of Social Gatherings with Friends and Family: Three times a week    Attends Religious Services: Never    Active Member of Clubs or Organizations: No    Attends Banker Meetings: Never    Marital Status: Divorced  Catering manager Violence: Not At Risk (06/01/2020)   Humiliation, Afraid, Rape, and Kick questionnaire    Fear of Current or Ex-Partner: No    Emotionally Abused: No    Physically  Abused: No    Sexually Abused: No    Outpatient Medications Prior to Visit  Medication Sig Dispense Refill   acetaminophen (TYLENOL) 500 MG tablet Take 500 mg by mouth every 6 (six) hours as needed. Pain.     atorvastatin (LIPITOR) 40 MG tablet Take 1 tablet (40 mg total) by mouth daily. 90 tablet 3   Blood Glucose Monitoring Suppl (ACCU-CHEK GUIDE) w/Device KIT 1 Piece by Does not apply route as directed. 1 kit 0   Cholecalciferol 50 MCG (2000 UT) CAPS Take 1 capsule (2,000 Units total) by mouth daily with breakfast. 90 capsule 1   collagenase (SANTYL) ointment Apply 1 application topically daily. 30 g 2   GE100 BLOOD GLUCOSE TEST test strip USE AS DIRECTED TO TEST TWICE DAILY 200 strip 2   glipiZIDE (GLUCOTROL XL) 5 MG 24 hr tablet TAKE ONE TABLET BY MOUTH DAILY WITH BREAKFAST 30 tablet 0   Lancets MISC 1 each by Does not apply route 4 (four) times daily. Use as directed to check blood glucose four times daily 200 each 1   losartan-hydrochlorothiazide (HYZAAR) 100-25 MG tablet Take 1 tablet by mouth daily. 90 tablet 1   metFORMIN (GLUCOPHAGE) 1000 MG tablet TAKE ONE TABLET BY MOUTH TWICE DAILY WITH MEALS 60 tablet 0   omeprazole (PRILOSEC) 20 MG capsule Take 1 capsule (20 mg total) by mouth daily as needed. Heartburn 30 capsule 3   RYBELSUS 3 MG TABS Take 1 tablet by mouth every morning.     silver sulfADIAZINE (SILVADENE) 1 % cream Apply 1 application  topically 2 (two) times daily as needed.     No facility-administered medications prior to visit.    Allergies  Allergen Reactions   Sulfamethoxazole-Trimethoprim Itching and Rash    Rash all over body, itching    Review of Systems  Constitutional:  Negative for chills and fever.  Eyes:  Negative for visual disturbance.  Respiratory:  Negative for chest tightness and shortness of breath.   Cardiovascular:  Positive for leg swelling.  Neurological:  Positive for weakness. Negative for dizziness and headaches.       Objective:     Physical Exam HENT:     Head: Normocephalic.     Mouth/Throat:     Mouth: Mucous membranes are moist.  Cardiovascular:     Rate and Rhythm: Normal rate.     Heart sounds: Normal heart sounds.  Pulmonary:  Effort: Pulmonary effort is normal.     Breath sounds: Normal breath sounds.  Musculoskeletal:     Right lower leg: No swelling or tenderness. Edema (mild) present.     Left lower leg: No tenderness. Edema (mild) present.     Comments: Unable to ambulate independently due to weakness in her lower extremities 2/5 Rle, and LLE muscle strength bilaterally RUE 2/5, LUE 2/5 Grip strength 2/5 Sensation is intact +2  patellar reflexes  Neurological:     Mental Status: She is alert and oriented to person, place, and time.     GCS: GCS eye subscore is 4. GCS verbal subscore is 5. GCS motor subscore is 6.     Sensory: Sensation is intact.     Motor: Weakness present.     Gait: Gait abnormal (unable to assess gait due to poor balance).     BP 133/77   Pulse (!) 107   Ht 5\' 6"  (1.676 m)   Wt 167 lb (75.8 kg)   SpO2 94%   BMI 26.95 kg/m  Wt Readings from Last 3 Encounters:  06/05/22 167 lb (75.8 kg)  05/25/22 167 lb 0.6 oz (75.8 kg)  10/25/21 174 lb 0.6 oz (78.9 kg)       Assessment & Plan:  Weakness of both lower extremities Assessment & Plan: History of leukemia Reports complication of chemotherapy that has led to weakness of her lower extremity She has followed up physical therapy No pain reported today ROM is intact She reports using an outdated electrical wheelchair at home, which she has used for the last 10 years PMD is necessary to get to the bathroom to toilet/bather PMD is necessary to get to the kitchen to prepare meals/cook PMD is necessary to get to the bedroom to groom/dress The patient cannot use a cane or walker due to poor balance The patient cannot use a MWC due to grip strength 2/5 The patient cannot use a scooter due to lack of postural  stability Elevating Legrests would be beneficial to relieve mild swelling in the lower extremities  The patient can safely operate the power mobility device both mentally and physically The patient is willing and motivated to use the power mobility device in the home She would like new orders today for an electrical wheelchair and her DMV form completed Orders placed for electric wheelchair and DMV forms completed  Orders: -     For home use only DME Wheelchair electric    Note: This chart has been completed using Engineer, civil (consulting) software, and while attempts have been made to ensure accuracy, certain words and phrases may not be transcribed as intended.    Gilmore Laroche, FNP

## 2022-06-05 NOTE — Patient Instructions (Addendum)
I appreciate the opportunity to provide care to you today!    Follow up:  08/24/2022   Please continue to a heart-healthy diet and increase your physical activities. Try to exercise for at least five days a week.      It was a pleasure to see you and I look forward to continuing to work together on your health and well-being. Please do not hesitate to call the office if you need care or have questions about your care.   Have a wonderful day and week. With Gratitude, Gilmore Laroche MSN, FNP-BC

## 2022-06-05 NOTE — Assessment & Plan Note (Addendum)
History of leukemia Reports complication of chemotherapy that has led to loss of strength of her lower extremity She has followed up physical therapy No pain reported today ROM is intact She reports using an outdated electrical wheelchair at home, which she has used for the last 10 years PMD is necessary to get to the bathroom to toilet/bather PMD is necessary to get to the kitchen to prepare meals/cook PMD is necessary to get to the bedroom to groom/dress The patient cannot use a cane or walker due to poor balance The patient cannot use a MWC due to grip strength 2/5 The patient cannot use a scooter due to lack of postural stability Elevating Legrests would be beneficial to relieve mild swelling in the lower extremities  The patient can safely operate the power mobility device both mentally and physically The patient is willing and motivated to use the power mobility device in the home She would like new orders today for an electrical wheelchair and her DMV form completed Orders placed for electric wheelchair and DMV forms completed

## 2022-06-09 LAB — COLOGUARD: COLOGUARD: NEGATIVE

## 2022-06-11 ENCOUNTER — Other Ambulatory Visit: Payer: Self-pay

## 2022-06-11 ENCOUNTER — Encounter: Payer: Self-pay | Admitting: Internal Medicine

## 2022-06-11 ENCOUNTER — Other Ambulatory Visit: Payer: Self-pay | Admitting: Family Medicine

## 2022-06-11 ENCOUNTER — Telehealth: Payer: Self-pay | Admitting: Family Medicine

## 2022-06-11 ENCOUNTER — Ambulatory Visit (INDEPENDENT_AMBULATORY_CARE_PROVIDER_SITE_OTHER): Payer: Medicare Other | Admitting: Internal Medicine

## 2022-06-11 DIAGNOSIS — E1165 Type 2 diabetes mellitus with hyperglycemia: Secondary | ICD-10-CM

## 2022-06-11 DIAGNOSIS — Z0000 Encounter for general adult medical examination without abnormal findings: Secondary | ICD-10-CM

## 2022-06-11 DIAGNOSIS — E1169 Type 2 diabetes mellitus with other specified complication: Secondary | ICD-10-CM

## 2022-06-11 MED ORDER — GLIPIZIDE ER 5 MG PO TB24: 5.0000 mg | ORAL_TABLET | Freq: Two times a day (BID) | ORAL | 1 refills | Status: DC

## 2022-06-11 MED ORDER — ATORVASTATIN CALCIUM 80 MG PO TABS: 80.0000 mg | ORAL_TABLET | Freq: Every day | ORAL | 3 refills | Status: DC

## 2022-06-11 MED ORDER — ATORVASTATIN CALCIUM 40 MG PO TABS: 40.0000 mg | ORAL_TABLET | Freq: Every day | ORAL | 3 refills | Status: DC

## 2022-06-11 NOTE — Addendum Note (Signed)
Addended byGilmore Laroche on: 06/11/2022 12:04 AM   Modules accepted: Orders

## 2022-06-11 NOTE — Progress Notes (Signed)
Subjective:  This is a telephone encounter between Kim [name] and Kim [name] on 06/11/2022 for AWV. The visit was conducted with the patient located at home and Kim [name] at [facility]. The patient's identity was confirmed using their DOB and current address. The patient has consented to being evaluated through a telephone encounter and understands the associated risks (an examination cannot be done and the patient may need to come in for an appointment) / benefits (allows the patient to remain at home, decreasing exposure to coronavirus).      Kim [name] is a 67 y.o. female who presents for Medicare Annual (Subsequent) preventive examination.  Review of Systems    Review of Systems  All other systems reviewed and are negative.   Objective:    Today's Vitals   06/11/22 0932  PainSc: 0-No pain   There is no height or weight on file to calculate BMI.     06/11/2022    9:35 AM 06/05/2021    9:41 AM 02/07/2021    3:02 PM 06/01/2020    9:31 AM 05/26/2013    5:41 PM 12/28/2011    8:20 AM  Advanced Directives  Does Patient Have a Medical Advance Directive? No No No No Patient does not have advance directive Patient would like information;Patient does not have advance directive  Does patient want to make changes to medical advance directive? No - Patient declined       Would patient like information on creating a medical advance directive?  No - Patient declined No - Patient declined No - Patient declined    Pre-existing out of facility DNR order (yellow form or pink MOST form)     No No    Current Medications (verified) Outpatient Encounter Medications as of 06/11/2022  Medication Sig   acetaminophen (TYLENOL) 500 MG tablet Take 500 mg by mouth every 6 (six) hours as needed. Pain.   Blood Glucose Monitoring Suppl (ACCU-CHEK GUIDE) w/Device KIT 1 Piece by Does not apply route as directed.   Cholecalciferol 50 MCG (2000 UT) CAPS Take 1 capsule (2,000 Units total) by mouth daily  with breakfast.   collagenase (SANTYL) ointment Apply 1 application topically daily.   GE100 BLOOD GLUCOSE TEST test strip USE AS DIRECTED TO TEST TWICE DAILY   Lancets MISC 1 each by Does not apply route 4 (four) times daily. Use as directed to check blood glucose four times daily   losartan-hydrochlorothiazide (HYZAAR) 100-25 MG tablet Take 1 tablet by mouth daily.   metFORMIN (GLUCOPHAGE) 1000 MG tablet TAKE ONE TABLET BY MOUTH TWICE DAILY WITH MEALS   omeprazole (PRILOSEC) 20 MG capsule Take 1 capsule (20 mg total) by mouth daily as needed. Heartburn   RYBELSUS 3 MG TABS Take 1 tablet by mouth every morning.   silver sulfADIAZINE (SILVADENE) 1 % cream Apply 1 application  topically 2 (two) times daily as needed.   [DISCONTINUED] atorvastatin (LIPITOR) 40 MG tablet Take 1 tablet (40 mg total) by mouth daily.   [DISCONTINUED] glipiZIDE (GLUCOTROL XL) 5 MG 24 hr tablet TAKE ONE TABLET BY MOUTH DAILY WITH BREAKFAST   No facility-administered encounter medications on file as of 06/11/2022.    Allergies (verified) Sulfamethoxazole-trimethoprim   History: Past Medical History:  Diagnosis Date   Cancer (HCC)    history of leukemia   Community acquired pneumonia 05/26/2013   Diabetes mellitus without complication (HCC)    Encounter for screening mammogram for malignant neoplasm of breast 03/26/2019   Hypercholesteremia  Hypertension    Neuropathy    Seasonal allergies    Past Surgical History:  Procedure Laterality Date   COLONOSCOPY  12/28/2011   Procedure: COLONOSCOPY;  Surgeon: West Bali, MD;  [facility];  Service: Endoscopy;  Laterality: N/A;  9:30 AM-changed to 8:30 [name] to notify pt   LAPAROSCOPY     TUBAL LIGATION     TUNNELED VENOUS CATHETER PLACEMENT     Family History  Problem Relation Age of Onset   Hypothyroidism Mother    Diabetes Mother    Hypertension Mother    Thyroid disease Mother    Hypertension Father    Stroke Father    Colon cancer Neg  Hx    Social History   Socioeconomic History   Marital status: Divorced    Spouse name: Not on file   Number of children: 2   Years of education: Not on file   Highest education level: 11th grade  Occupational History   Not on file  Tobacco Use   Smoking status: Every Day    Packs/day: 0.30    Years: 40.00    Additional pack years: 0.00    Total pack years: 12.00    Types: Cigarettes   Smokeless tobacco: Never   Tobacco comments:    Working on quitting  Substance and Sexual Activity   Alcohol use: No   Drug use: No   Sexual activity: Not Currently  Other Topics Concern   Not on file  Social History Narrative   Lives with 67 year old mother -caregiver    2 grown children   Son Kim [name] in [place]    Daughter Kim [name] in [place] with daughter Kim [name]       Enjoys: shopping, play games       Diet: not as good a she would like, eats all food groups   Caffeine: 2 cups of coffee in the mornings, coke daily   Water: 3-4 cups daily       Wear seat belt    Smoke detectors at home   Does not use phone while driving      Social Determinants of Health   Financial Resource Strain: Low Risk  (06/05/2021)   Overall Financial Resource Strain (CARDIA)    Difficulty of Paying Living Expenses: Not hard at all  Food Insecurity: No Food Insecurity (06/05/2021)   Hunger Vital Sign    Worried About Running Out of Food in the Last Year: Never true    Ran Out of Food in the Last Year: Never true  Transportation Needs: No Transportation Needs (06/05/2021)   PRAPARE - Administrator, Civil Service (Medical): No    Lack of Transportation (Non-Medical): No  Physical Activity: Inactive (06/05/2021)   Exercise Vital Sign    Days of Exercise per Week: 0 days    Minutes of Exercise per Session: 0 min  Stress: No Stress Concern Present (06/05/2021)   [employer] of Occupational Health - Occupational Stress Questionnaire    Feeling of Stress : Not at all   Social Connections: Socially Isolated (06/05/2021)   Social Connection and Isolation Panel [NHANES]    Frequency of Communication with Friends and Family: Three times a week    Frequency of Social Gatherings with Friends and Family: Three times a week    Attends Religious Services: Never    Active Member of Clubs or Organizations: No    Attends Banker Meetings: Never  Marital Status: Divorced    Tobacco Counseling Ready to quit: Not Answered Counseling given: Not Answered Tobacco comments: Working on quitting   Clinical Intake:  Pre-visit preparation completed: Yes  Pain : No/denies pain Pain Score: 0-No pain    Diabetes: Yes  How often do you need to have someone help you when you read instructions, pamphlets, or other written materials from your doctor or pharmacy?: 1 - Never What is the last grade level you completed in school?: 11th grade    Activities of Daily Living    06/11/2022    9:37 AM  In your present state of health, do you have any difficulty performing the following activities:  Hearing? 0  Vision? 0  Difficulty concentrating or making decisions? 0  Walking or climbing stairs? 0  Dressing or bathing? 0  Doing errands, shopping? 0    Patient Care Team: Gilmore Laroche, FNP as PCP - General (Family Medicine)  Indicate any recent Medical Services you may have received from other than [facility] providers in the past year (date may be approximate).     Assessment:   This is a routine wellness examination for [place].  Hearing/Vision screen No results found.  Dietary issues and exercise activities discussed:     Goals Addressed   None    Depression Screen    06/11/2022    9:36 AM 05/25/2022    9:31 AM 10/25/2021   10:36 AM 07/28/2021   10:40 AM 07/21/2021    2:59 PM 05/01/2021    1:57 PM 04/04/2021    3:04 PM  PHQ 2/9 Scores  PHQ - 2 Score 0 0 0 0 0 0 0  PHQ- 9 Score  0         Fall Risk    06/11/2022    9:36 AM 05/25/2022     9:31 AM 10/25/2021   10:35 AM 07/28/2021   10:40 AM 07/21/2021    2:59 PM  Fall Risk   Falls in the past year? 0 0 0 0 0  Number falls in past yr:  0 0 0   Injury with Fall?  0 0 0   Risk for fall due to :  No Fall Risks No Fall Risks No Fall Risks Impaired mobility  Risk for fall due to: Comment     w/c  Follow up  Falls evaluation completed Falls evaluation completed Falls evaluation completed Falls evaluation completed    FALL RISK PREVENTION PERTAINING TO THE HOME:  Any stairs in or around the home? Yes  If so, are there any without handrails? Yes  Home free of loose throw rugs in walkways, pet beds, electrical cords, etc? No  Adequate lighting in your home to reduce risk of falls? No   ASSISTIVE DEVICES UTILIZED TO PREVENT FALLS:  Life alert? No  Use of a cane, walker or w/c? Yes  Grab bars in the bathroom? Yes  Shower chair or bench in shower? Yes  Elevated toilet seat or a handicapped toilet? Yes     Cognitive Function:        06/11/2022    9:37 AM 06/05/2021    9:30 AM  6CIT Screen  What Year? 0 points 0 points  What month? 0 points 0 points  What time? 0 points 0 points  Count back from 20 0 points 0 points  Months in reverse 0 points 2 points  Repeat phrase 2 points 2 points  Total Score 2 points 4 points    Immunizations Immunization  History  Administered Date(s) Administered   COVID-19, mRNA, vaccine(Comirnaty)12 years and older 01/12/2022   Fluad Quad(high Dose 65+) 10/25/2021   Influenza, Quadrivalent, Recombinant, Inj, Pf 12/06/2020   Moderna SARS-COV2 Booster Vaccination 06/09/2020   Moderna Sars-Covid-2 Vaccination 09/13/2019, 10/11/2019, 12/06/2020   Pneumococcal Polysaccharide-23 11/03/2010, 05/27/2013, 06/23/2019   Rsv, Bivalent, Protein Subunit Rsvpref,pf Verdis Frederickson) 12/02/2021   Td 08/12/2019   Tdap 08/12/2019    TDAP status: Up to date  Flu Vaccine status: Up to date  Pneumococcal vaccine status: Up to date  Covid-19 vaccine status:  Completed vaccines  Qualifies for Shingles Vaccine? Yes   Zostavax completed No   Shingrix Completed?: No.    Education has been provided regarding the importance of this vaccine. Patient has been advised to call insurance company to determine out of pocket expense if they have not yet received this vaccine. Advised may also receive vaccine at local pharmacy or Health Dept. Verbalized acceptance and understanding.  Screening Tests Health Maintenance  Topic Date Due   Zoster Vaccines- Shingrix (1 of 2) Never done   COLONOSCOPY (Pts 45-33yrs Insurance coverage will need to be confirmed)  12/27/2021   OPHTHALMOLOGY EXAM  02/09/2022   Medicare Annual Wellness (AWV)  06/06/2022   COVID-19 Vaccine (5 - 2023-24 season) 06/21/2022 (Originally 03/09/2022)   MAMMOGRAM  07/13/2022   Diabetic kidney evaluation - Urine ACR  08/10/2022   INFLUENZA VACCINE  09/13/2022   HEMOGLOBIN A1C  11/24/2022   Diabetic kidney evaluation - eGFR measurement  05/25/2023   FOOT EXAM  05/25/2023   DTaP/Tdap/Td (3 - Td or Tdap) 08/11/2029   Pneumonia Vaccine 40+ Years old  Completed   DEXA SCAN  Completed   Hepatitis C Screening  Completed   HPV VACCINES  Aged Out    Health Maintenance  Health Maintenance Due  Topic Date Due   Zoster Vaccines- Shingrix (1 of 2) Never done   COLONOSCOPY (Pts 45-29yrs Insurance coverage will need to be confirmed)  12/27/2021   OPHTHALMOLOGY EXAM  02/09/2022   Medicare Annual Wellness (AWV)  06/06/2022    Colorectal cancer screening: Type of screening: Cologuard. Results pending  Mammogram status: Completed 07/12/2021. Repeat every year  Bone Density status: Completed 07/12/2021. Results reflect: Bone density results: NORMAL.   Lung Cancer Screening: (Low Dose CT Chest recommended if Age 53-80 years, 30 pack-year currently smoking OR have quit w/in 15years.) does not qualify.    Additional Screening:  Hepatitis C Screening: does not qualify; Completed 05/25/2022  Vision  Screening: Recommended annual ophthalmology exams for early detection of glaucoma and other disorders of the eye. Is the patient up to date with their annual eye exam?  Yes  Who is the provider or what is the name of the office in which the patient attends annual eye exams? [facility] If pt is not established with a provider, would they like to be referred to a provider to establish care? No .   Dental Screening: Recommended annual dental exams for proper oral hygiene  Community Resource Referral / Chronic Care Management: CRR required this visit?  No   CCM required this visit?  No      Plan:     I have personally reviewed and noted the following in the patient's chart:   Medical and social history Use of alcohol, tobacco or illicit drugs  Current medications and supplements including opioid prescriptions. Patient is not currently taking opioid prescriptions. Functional ability and status Nutritional status Physical activity Advanced directives List of other physicians Hospitalizations,  surgeries, and ER visits in previous 12 months Vitals Screenings to include cognitive, depression, and falls Referrals and appointments  In addition, I have reviewed and discussed with patient certain preventive protocols, quality metrics, and best practice recommendations. A written personalized care plan for preventive services as well as general preventive health recommendations were provided to patient.     Kim Banister, MD   06/11/2022

## 2022-06-11 NOTE — Telephone Encounter (Signed)
Celina called from [facility] need clarification on meds that were sent over today [phone]

## 2022-06-11 NOTE — Progress Notes (Signed)
The ASCVD Risk score (Arnett DK, et al., 2019) failed to calculate for the following reasons:   The valid total cholesterol range is 130 to 320 mg/dL  

## 2022-06-11 NOTE — Patient Instructions (Addendum)
  Ms. Vaccarella , Thank you for taking time to come for your Medicare Wellness Visit. I appreciate your ongoing commitment to your health goals. Please review the following plan we discussed and let me know if I can assist you in the future.   These are the goals we discussed:  Goals      CCM Expected Outcome:  Monitor, Self-Manage and Reduce Symptoms of:     Prevent falls     Patient would like to avoid falls and have her ankle be completely healed. She is working with her current doctors .         This is a list of the screening recommended for you and due dates:  Health Maintenance  Topic Date Due   Zoster (Shingles) Vaccine (1 of 2) Never done   Colon Cancer Screening  12/27/2021   Eye exam for diabetics  02/09/2022   Medicare Annual Wellness Visit  06/06/2022   COVID-19 Vaccine (5 - 2023-24 season) 06/21/2022*   Mammogram  07/13/2022   Yearly kidney health urinalysis for diabetes  08/10/2022   Flu Shot  09/13/2022   Hemoglobin A1C  11/24/2022   Yearly kidney function blood test for diabetes  05/25/2023   Complete foot exam   05/25/2023   DTaP/Tdap/Td vaccine (3 - Td or Tdap) 08/11/2029   Pneumonia Vaccine  Completed   DEXA scan (bone density measurement)  Completed   Hepatitis C Screening: USPSTF Recommendation to screen - Ages 45-79 yo.  Completed   HPV Vaccine  Aged Out  *Topic was postponed. The date shown is not the original due date.

## 2022-06-14 DIAGNOSIS — E11622 Type 2 diabetes mellitus with other skin ulcer: Secondary | ICD-10-CM | POA: Diagnosis not present

## 2022-06-14 DIAGNOSIS — M869 Osteomyelitis, unspecified: Secondary | ICD-10-CM | POA: Diagnosis not present

## 2022-06-14 DIAGNOSIS — L97322 Non-pressure chronic ulcer of left ankle with fat layer exposed: Secondary | ICD-10-CM | POA: Diagnosis not present

## 2022-06-19 DIAGNOSIS — Z7984 Long term (current) use of oral hypoglycemic drugs: Secondary | ICD-10-CM | POA: Diagnosis not present

## 2022-06-19 DIAGNOSIS — E11622 Type 2 diabetes mellitus with other skin ulcer: Secondary | ICD-10-CM | POA: Diagnosis not present

## 2022-06-19 DIAGNOSIS — L97322 Non-pressure chronic ulcer of left ankle with fat layer exposed: Secondary | ICD-10-CM | POA: Diagnosis not present

## 2022-06-27 ENCOUNTER — Ambulatory Visit (INDEPENDENT_AMBULATORY_CARE_PROVIDER_SITE_OTHER): Payer: Medicare Other | Admitting: "Endocrinology

## 2022-06-27 ENCOUNTER — Encounter: Payer: Self-pay | Admitting: "Endocrinology

## 2022-06-27 VITALS — BP 126/68 | HR 96 | Ht 66.0 in

## 2022-06-27 DIAGNOSIS — I10 Essential (primary) hypertension: Secondary | ICD-10-CM | POA: Diagnosis not present

## 2022-06-27 DIAGNOSIS — Z7984 Long term (current) use of oral hypoglycemic drugs: Secondary | ICD-10-CM

## 2022-06-27 DIAGNOSIS — E782 Mixed hyperlipidemia: Secondary | ICD-10-CM

## 2022-06-27 DIAGNOSIS — E063 Autoimmune thyroiditis: Secondary | ICD-10-CM

## 2022-06-27 DIAGNOSIS — E1165 Type 2 diabetes mellitus with hyperglycemia: Secondary | ICD-10-CM | POA: Diagnosis not present

## 2022-06-27 MED ORDER — RYBELSUS 7 MG PO TABS: 7.0000 mg | ORAL_TABLET | Freq: Every day | ORAL | 1 refills | Status: DC

## 2022-06-27 NOTE — Patient Instructions (Signed)

## 2022-06-27 NOTE — Progress Notes (Unsigned)
06/27/2022, 5:27 PM   Endocrinology follow-up note  Subjective:    Patient ID: Kim [name], female    DOB: [birth year].  Kim [name] is being seen in follow-up after she was seen in consultation for management of currently uncontrolled symptomatic diabetes requested by  Gilmore Laroche, FNP.   Past Medical History:  Diagnosis Date   Cancer [facility])    history of leukemia   Community acquired pneumonia 05/26/2013   Diabetes mellitus without complication (HCC)    Encounter for screening mammogram for malignant neoplasm of breast 03/26/2019   Hypercholesteremia    Hypertension    Neuropathy    Seasonal allergies     Past Surgical History:  Procedure Laterality Date   COLONOSCOPY  12/28/2011   Procedure: COLONOSCOPY;  Surgeon: West Bali, MD;  Location: [facility];  Service: Endoscopy;  Laterality: N/A;  9:30 AM-changed to 8:30 [name] to notify pt   FOOT SURGERY Left    LAPAROSCOPY     TUBAL LIGATION     TUNNELED VENOUS CATHETER PLACEMENT      Social History   Socioeconomic History   Marital status: Divorced    Spouse name: Not on file   Number of children: 2   Years of education: Not on file   Highest education level: 11th grade  Occupational History   Not on file  Tobacco Use   Smoking status: Every Day    Packs/day: 0.30    Years: 40.00    Additional pack years: 0.00    Total pack years: 12.00    Types: Cigarettes   Smokeless tobacco: Never   Tobacco comments:    Working on quitting  Substance and Sexual Activity   Alcohol use: No   Drug use: No   Sexual activity: Not Currently  Other Topics Concern   Not on file  Social History Narrative   Lives with 71 year old mother -caregiver    2 grown children   Son Kim [name] in [place]    Daughter Kim [name] in [place] with daughter Kim [name]       Enjoys: shopping, play games       Diet: not as good a  she would like, eats all food groups   Caffeine: 2 cups of coffee in the mornings, coke daily   Water: 3-4 cups daily       Wear seat belt    Smoke detectors at home   Does not use phone while driving      Social Determinants of Health   Financial Resource Strain: Low Risk  (06/05/2021)   Overall Financial Resource Strain (CARDIA)    Difficulty of Paying Living Expenses: Not hard at all  Food Insecurity: No Food Insecurity (06/05/2021)   Hunger Vital Sign    Worried About Running Out of Food in the Last Year: Never true    Ran Out of Food in the Last Year: Never true  Transportation Needs: No Transportation Needs (06/05/2021)   PRAPARE - Administrator, Civil Service (Medical): No    Lack of Transportation (Non-Medical): No  Physical Activity: Inactive (06/05/2021)   Exercise Vital Sign    Days of Exercise per Week: 0 days    Minutes of Exercise per Session: 0 min  Stress: No Stress Concern Present (06/05/2021)   [employer] of Occupational Health - Occupational Stress Questionnaire    Feeling of Stress : Not at all  Social Connections: Socially Isolated (06/05/2021)   Social Connection and Isolation Panel [NHANES]    Frequency of Communication with Friends and Family: Three times a week    Frequency of Social Gatherings with Friends and Family: Three times a week    Attends Religious Services: Never    Active Member of Clubs or Organizations: No    Attends Banker Meetings: Never    Marital Status: Divorced    Family History  Problem Relation Age of Onset   Hypothyroidism Mother    Diabetes Mother    Hypertension Mother    Thyroid disease Mother    Hypertension Father    Stroke Father    Colon cancer Neg Hx     Outpatient Encounter Medications as of 06/27/2022  Medication Sig   Semaglutide (RYBELSUS) 7 MG TABS Take 1 tablet (7 mg total) by mouth daily.   acetaminophen (TYLENOL) 500 MG tablet Take 500 mg by mouth every 6 (six) hours as  needed. Pain.   atorvastatin (LIPITOR) 40 MG tablet Take 1 tablet (40 mg total) by mouth daily.   Blood Glucose Monitoring Suppl (ACCU-CHEK GUIDE) w/Device KIT 1 Piece by Does not apply route as directed.   Cholecalciferol 50 MCG (2000 UT) CAPS Take 1 capsule (2,000 Units total) by mouth daily with breakfast.   collagenase (SANTYL) ointment Apply 1 application topically daily.   GE100 BLOOD GLUCOSE TEST test strip USE AS DIRECTED TO TEST TWICE DAILY   Lancets MISC 1 each by Does not apply route 4 (four) times daily. Use as directed to check blood glucose four times daily   losartan-hydrochlorothiazide (HYZAAR) 100-25 MG tablet Take 1 tablet by mouth daily.   metFORMIN (GLUCOPHAGE) 1000 MG tablet TAKE ONE TABLET BY MOUTH TWICE DAILY WITH MEALS   omeprazole (PRILOSEC) 20 MG capsule Take 1 capsule (20 mg total) by mouth daily as needed. Heartburn   silver sulfADIAZINE (SILVADENE) 1 % cream Apply 1 application  topically 2 (two) times daily as needed.   [DISCONTINUED] glipiZIDE (GLUCOTROL XL) 5 MG 24 hr tablet Take 1 tablet (5 mg total) by mouth in the morning and at bedtime.   [DISCONTINUED] RYBELSUS 3 MG TABS Take 1 tablet by mouth every morning.   No facility-administered encounter medications on file as of 06/27/2022.    ALLERGIES: Allergies  Allergen Reactions   Sulfamethoxazole-Trimethoprim Itching and Rash    Rash all over body, itching    VACCINATION STATUS: Immunization History  Administered Date(s) Administered   COVID-19, mRNA, vaccine(Comirnaty)12 years and older 01/12/2022   Fluad Quad(high Dose 65+) 10/25/2021   Influenza, Quadrivalent, Recombinant, Inj, Pf 12/06/2020   Moderna SARS-COV2 Booster Vaccination 06/09/2020   Moderna Sars-Covid-2 Vaccination 09/13/2019, 10/11/2019, 12/06/2020   Pneumococcal Polysaccharide-23 11/03/2010, 05/27/2013, 06/23/2019   Rsv, Bivalent, Protein Subunit Rsvpref,pf Verdis Frederickson) 12/02/2021   Td 08/12/2019   Tdap 08/12/2019    Diabetes She  presents for her follow-up diabetic visit. She has type 2 diabetes mellitus. Onset time: She was diagnosed at approximate age of 39 years. Her disease course has been stable. There are no hypoglycemic associated symptoms. Pertinent negatives for hypoglycemia include no confusion, headaches, pallor or seizures. Associated symptoms  include blurred vision and fatigue. Pertinent negatives for diabetes include no chest pain, no polydipsia, no polyphagia and no polyuria. There are no hypoglycemic complications. Symptoms are stable. Diabetic complications include nephropathy. Risk factors for coronary artery disease include diabetes mellitus, dyslipidemia, hypertension, family history, tobacco exposure, sedentary lifestyle and post-menopausal. Current diabetic treatment includes oral agent (monotherapy). Her weight is fluctuating minimally. She is following a generally unhealthy diet. When asked about meal planning, she reported none. She has not had a previous visit with a dietitian. She rarely (She is wheelchair-bound due to deconditioning/disequilibrium.  ) participates in exercise. Her home blood glucose trend is fluctuating minimally. Her breakfast blood glucose range is generally 130-140 mg/dl. Her bedtime blood glucose range is generally 140-180 mg/dl. Her overall blood glucose range is 140-180 mg/dl. Steward Drone presents with controlled to near target glucose profile.  Her point-of-care A1c is 7%, overall improving from 11.5%.    She did not document any hypoglycemia.    ) An ACE inhibitor/angiotensin II receptor blocker is being taken. Eye exam is current.  Hyperlipidemia This is a chronic problem. The current episode started more than 1 year ago. Exacerbating diseases include diabetes. Pertinent negatives include no chest pain, myalgias or shortness of breath. Current antihyperlipidemic treatment includes statins. Risk factors for coronary artery disease include diabetes mellitus, hypertension, dyslipidemia,  family history, post-menopausal and a sedentary lifestyle.  Hypertension This is a chronic problem. The current episode started more than 1 year ago. Associated symptoms include blurred vision. Pertinent negatives include no chest pain, headaches, palpitations or shortness of breath. Risk factors for coronary artery disease include diabetes mellitus, dyslipidemia, family history, post-menopausal state, smoking/tobacco exposure and sedentary lifestyle.     Review of Systems  Constitutional:  Positive for fatigue. Negative for chills, fever and unexpected weight change.  HENT:  Negative for trouble swallowing and voice change.   Eyes:  Positive for blurred vision. Negative for visual disturbance.  Respiratory:  Negative for cough, shortness of breath and wheezing.   Cardiovascular:  Negative for chest pain, palpitations and leg swelling.  Gastrointestinal:  Negative for diarrhea, nausea and vomiting.  Endocrine: Negative for cold intolerance, heat intolerance, polydipsia, polyphagia and polyuria.  Musculoskeletal:  Positive for gait problem. Negative for arthralgias and myalgias.       She is wheelchair-bound due to deconditioning/disequilibrium.  Skin:  Negative for color change, pallor, rash and wound.  Neurological:  Negative for seizures and headaches.  Psychiatric/Behavioral:  Negative for confusion and suicidal ideas.     Objective:       06/27/2022    2:23 PM 06/05/2022    2:30 PM 05/25/2022    9:29 AM  Vitals with BMI  Height 5\' 6"  5\' 6"  5\' 6"   Weight  167 lbs 167 lbs 1 oz  BMI  26.97 26.97  Systolic 126 133 409  Diastolic 68 77 78  Pulse 96 107 110    BP 126/68   Pulse 96   Ht 5\' 6"  (1.676 m)   BMI 26.95 kg/m   Wt Readings from Last 3 Encounters:  06/05/22 167 lb (75.8 kg)  05/25/22 167 lb 0.6 oz (75.8 kg)  10/25/21 174 lb 0.6 oz (78.9 kg)      CMP ( most recent) CMP     Component Value Date/Time   NA 140 05/25/2022 1020   K 4.6 05/25/2022 1020   CL 96  05/25/2022 1020   CO2 23 05/25/2022 1020   GLUCOSE 169 (H) 05/25/2022 1020   GLUCOSE 228 (  H) 07/21/2021 0329   BUN 25 05/25/2022 1020   CREATININE 0.98 05/25/2022 1020   CREATININE 0.95 07/21/2021 0329   CALCIUM 9.9 05/25/2022 1020   PROT 7.2 05/25/2022 1020   ALBUMIN 4.0 05/25/2022 1020   AST 17 05/25/2022 1020   ALT 19 05/25/2022 1020   ALKPHOS 143 (H) 05/25/2022 1020   BILITOT 0.3 05/25/2022 1020   GFRNONAA 34 (L) 02/07/2021 1745   GFRNONAA 65 03/26/2019 1517   GFRAA 68 12/02/2019 1203   GFRAA 75 03/26/2019 1517    Diabetic Labs (most recent): Lab Results  Component Value Date   HGBA1C 7.0 (H) 05/25/2022   HGBA1C 6.8 09/25/2021   HGBA1C 7.3 (A) 02/22/2021    Lipid Panel     Component Value Date/Time   CHOL 125 05/25/2022 1020   TRIG 143 05/25/2022 1020   HDL 39 (L) 05/25/2022 1020   CHOLHDL 3.2 05/25/2022 1020   CHOLHDL 4.3 03/26/2019 1517   LDLCALC 61 05/25/2022 1020   LDLCALC 122 (H) 03/26/2019 1517   LABVLDL 25 05/25/2022 1020      Latest Reference Range & Units 05/25/22 10:00 05/25/22 10:20  TSH 0.450 - 4.500 uIU/mL 4.130 4.170  T4,Free(Direct) 0.82 - 1.77 ng/dL 1.61 0.96   Assessment & Plan:   1. Uncontrolled type 2 diabetes mellitus with hyperglycemia (HCC)  - TINAYA SESSUMS has currently uncontrolled symptomatic type 2 DM since  67 years of age.   She presents her log which shows significantly improved glycemic profile to near target levels.    Shelleen presents with controlled to near target glucose profile.  Her point-of-care A1c is 7%, overall improving from 11.5%.    She did not document any hypoglycemia.    Recent labs reviewed. - I had a long discussion with her about the progressive nature of diabetes and the pathology behind its complications. -her diabetes is complicated by CKD, sedentary life, smoking and she remains at a high risk for more acute and chronic complications which include CAD, CVA, retinopathy, and neuropathy. These are all  discussed in detail with her.  - I have counseled her on diet  and weight management  by adopting a carbohydrate restricted/protein rich diet. Patient is encouraged to switch to  unprocessed or minimally processed     complex starch and increased protein intake (animal or plant source), fruits, and vegetables. -  she is advised to stick to a routine mealtimes to eat 3 meals  a day and avoid unnecessary snacks ( to snack only to correct hypoglycemia).   - she acknowledges that there is a room for improvement in her food and drink choices. - Suggestion is made for her to avoid simple carbohydrates  from her diet including Cakes, Sweet Desserts, Ice Cream, Soda (diet and regular), Sweet Tea, Candies, Chips, Cookies, Store Bought Juices, Alcohol , Artificial Sweeteners,  Coffee Creamer, and "Sugar-free" Products, Lemonade. This will help patient to have more stable blood glucose profile and potentially avoid unintended weight gain.  The following Lifestyle Medicine recommendations according to American College of Lifestyle Medicine  [facility]) were discussed and and offered to patient and she  agrees to start the journey:  A. Whole Foods, Plant-Based Nutrition comprising of fruits and vegetables, plant-based proteins, whole-grain carbohydrates was discussed in detail with the patient.   A list for source of those nutrients were also provided to the patient.  Patient will use only water or unsweetened tea for hydration. B.  The need to stay away from risky substances  including alcohol, smoking; obtaining 7 to 9 hours of restorative sleep, at least 150 minutes of moderate intensity exercise weekly, the importance of healthy social connections,  and stress management techniques were discussed. C.  A full color page of  Calorie density of various food groups per pound showing examples of each food groups was provided to the patient.   - I have approached her with the following individualized plan to manage  her  diabetes and patient agrees:   -Based on her presentation with near target glycemic profile, she will not need insulin treatment for now.  She is advised to continue metformin 1000 mg p.o. twice daily, tolerated Rybelsus 3 mg.  I discussed and increase her Rybelsus to 7 mg once a day at breakfast.  She is advised to stop glipizide at this time.   She will continue to monitor blood glucose twice a day-daily before breakfast and at bedtime. -  she is encouraged to call clinic for blood glucose levels less than 70 or greater than 200 mg/Dl at fasting.   - she is not a candidate for SGLT2 inhibitors nor incretin therapy.   - Specific targets for  A1c;  LDL, HDL,  and Triglycerides were discussed with the patient.  2) Blood Pressure /Hypertension:  Her blood pressure is controlled to target. she is advised to continue her current medications including losartan/HCTZ 100/25 mg p.o. daily with breakfast .  3) Lipids/Hyperlipidemia:   Review of her recent lipid panel showed improved LDL to 61, overall improving from 122.  She is benefiting from lifestyle medicine, whole food plant-based diet.  She states that she has not taken atorvastatin for more than a year.    She will be considered for fasting lipid panel before next visit.    4)  Weight/Diet:  Body mass index is 26.95 kg/m.  -She is a candidate for some  weight loss.   I discussed with her the fact that loss of 5 - 10% of her  current body weight will have the most impact on her diabetes management.  Exercise, and detailed carbohydrates information provided  -  detailed on discharge instructions.  5) Chronic Care/Health Maintenance:  -she  is on ACEI/ARB and Statin medications and  is encouraged to initiate and continue to follow up with Ophthalmology, Dentist,  Podiatrist at least yearly or according to recommendations, and advised to  Quit smoking. I have recommended yearly flu vaccine and pneumonia vaccine at least every 5 years;  and  sleep  for at least 7 hours a day.  She is wheelchair-bound, cannot exercise optimally.   6) subclinical hypothyroidism  -She did have high antithyroid antibodies.  However, she continues to have euthyroid state based on her current thyroid function test., She will not need intervention with thyroid hormone at this time.  - she is  advised to maintain close follow up with Gilmore Laroche, FNP for primary care needs, as well as her other providers for optimal and coordinated care.  I spent  26  minutes in the care of the patient today including review of labs from CMP, Lipids, Thyroid Function, Hematology (current and previous including abstractions from other facilities); face-to-face time discussing  her blood glucose readings/logs, discussing hypoglycemia and hyperglycemia episodes and symptoms, medications doses, her options of short and long term treatment based on the latest standards of care / guidelines;  discussion about incorporating lifestyle medicine;  and documenting the encounter. Risk reduction counseling performed per USPSTF guidelines to reduce  cardiovascular  risk factors.     Please refer to Patient Instructions for Blood Glucose Monitoring and Insulin/Medications Dosing Guide"  in media tab for additional information. Please  also refer to " Patient Self Inventory" in the Media  tab for reviewed elements of pertinent patient history.  [name] participated in the discussions, expressed understanding, and voiced agreement with the above plans.  All questions were answered to her satisfaction. she is encouraged to contact clinic should she have any questions or concerns prior to her return visit.    Follow up plan: - Return for Bring Meter/CGM Device/Logs- A1c in Office.  Marquis Lunch, MD [facility] [address] [place], [place] [address] Phone: [phone]  Fax: [phone]    06/27/2022, 5:27 PM  This note was  partially dictated with voice recognition software. Similar sounding words can be transcribed inadequately or may not  be corrected upon review.

## 2022-06-28 DIAGNOSIS — Z7984 Long term (current) use of oral hypoglycemic drugs: Secondary | ICD-10-CM | POA: Insufficient documentation

## 2022-06-28 DIAGNOSIS — E063 Autoimmune thyroiditis: Secondary | ICD-10-CM | POA: Insufficient documentation

## 2022-07-02 ENCOUNTER — Other Ambulatory Visit: Payer: Self-pay | Admitting: *Deleted

## 2022-07-02 ENCOUNTER — Telehealth: Payer: Self-pay | Admitting: "Endocrinology

## 2022-07-02 DIAGNOSIS — E1165 Type 2 diabetes mellitus with hyperglycemia: Secondary | ICD-10-CM

## 2022-07-02 MED ORDER — RYBELSUS 7 MG PO TABS: 7.0000 mg | ORAL_TABLET | Freq: Every day | ORAL | 1 refills | Status: DC

## 2022-07-02 NOTE — Telephone Encounter (Signed)
Rx has been sent in to [facility] in [place].

## 2022-07-02 NOTE — Telephone Encounter (Signed)
Pt needs prescription for Rybelsus sent to [facility] on [address], [place].

## 2022-07-04 ENCOUNTER — Ambulatory Visit: Payer: Medicare Other | Admitting: "Endocrinology

## 2022-07-05 DIAGNOSIS — L97322 Non-pressure chronic ulcer of left ankle with fat layer exposed: Secondary | ICD-10-CM | POA: Diagnosis not present

## 2022-07-11 ENCOUNTER — Other Ambulatory Visit: Payer: Self-pay

## 2022-07-11 DIAGNOSIS — E1165 Type 2 diabetes mellitus with hyperglycemia: Secondary | ICD-10-CM

## 2022-07-11 MED ORDER — ACCU-CHEK GUIDE VI STRP
1.0000 | ORAL_STRIP | Freq: Two times a day (BID) | 2 refills | Status: DC
Start: 2022-07-11 — End: 2023-01-15

## 2022-07-11 MED ORDER — ACCU-CHEK GUIDE W/DEVICE KIT
PACK | 0 refills | Status: AC
Start: 2022-07-11 — End: ?

## 2022-07-11 MED ORDER — ACCU-CHEK SOFTCLIX LANCETS MISC
2 refills | Status: AC
Start: 2022-07-11 — End: ?

## 2022-08-23 DIAGNOSIS — L97322 Non-pressure chronic ulcer of left ankle with fat layer exposed: Secondary | ICD-10-CM | POA: Diagnosis not present

## 2022-08-24 ENCOUNTER — Encounter: Payer: Self-pay | Admitting: Family Medicine

## 2022-08-24 ENCOUNTER — Ambulatory Visit (INDEPENDENT_AMBULATORY_CARE_PROVIDER_SITE_OTHER): Payer: Medicare Other | Admitting: Family Medicine

## 2022-08-24 ENCOUNTER — Other Ambulatory Visit: Payer: Self-pay

## 2022-08-24 VITALS — BP 124/67 | HR 73 | Ht 66.0 in | Wt 160.1 lb

## 2022-08-24 DIAGNOSIS — Z1231 Encounter for screening mammogram for malignant neoplasm of breast: Secondary | ICD-10-CM | POA: Diagnosis not present

## 2022-08-24 DIAGNOSIS — I10 Essential (primary) hypertension: Secondary | ICD-10-CM

## 2022-08-24 DIAGNOSIS — E559 Vitamin D deficiency, unspecified: Secondary | ICD-10-CM

## 2022-08-24 DIAGNOSIS — E7849 Other hyperlipidemia: Secondary | ICD-10-CM | POA: Diagnosis not present

## 2022-08-24 DIAGNOSIS — E038 Other specified hypothyroidism: Secondary | ICD-10-CM | POA: Diagnosis not present

## 2022-08-24 DIAGNOSIS — E1169 Type 2 diabetes mellitus with other specified complication: Secondary | ICD-10-CM

## 2022-08-24 DIAGNOSIS — K219 Gastro-esophageal reflux disease without esophagitis: Secondary | ICD-10-CM | POA: Diagnosis not present

## 2022-08-24 DIAGNOSIS — E1165 Type 2 diabetes mellitus with hyperglycemia: Secondary | ICD-10-CM

## 2022-08-24 DIAGNOSIS — E785 Hyperlipidemia, unspecified: Secondary | ICD-10-CM

## 2022-08-24 NOTE — Progress Notes (Signed)
Established Patient Office Visit  Subjective:  Patient ID: Kim [name], female    DOB: [birth year]  Age: 67 y.o. MRN: [id number]  CC:  Chief Complaint  Patient presents with   Chronic Care Management    3 month f/u    HPI Kim [name] is a 67 y.o. female with past medical history of HLP, HTN, GERD, and T2DM resents for f/u of  chronic medical conditions. For the details of today's visit, please refer to the assessment and plan.     Past Medical History:  Diagnosis Date   Cancer [facility])    history of leukemia   Community acquired pneumonia 05/26/2013   Diabetes mellitus without complication (HCC)    Encounter for screening mammogram for malignant neoplasm of breast 03/26/2019   Hypercholesteremia    Hypertension    Neuropathy    Seasonal allergies     Past Surgical History:  Procedure Laterality Date   COLONOSCOPY  12/28/2011   Procedure: COLONOSCOPY;  Surgeon: West Bali, MD;  [facility];  Service: Endoscopy;  Laterality: N/A;  9:30 AM-changed to 8:30 [name] to notify pt   FOOT SURGERY Left    LAPAROSCOPY     TUBAL LIGATION     TUNNELED VENOUS CATHETER PLACEMENT      Family History  Problem Relation Age of Onset   Hypothyroidism Mother    Diabetes Mother    Hypertension Mother    Thyroid disease Mother    Hypertension Father    Stroke Father    Colon cancer Neg Hx     Social History   Socioeconomic History   Marital status: Divorced    Spouse name: Not on file   Number of children: 2   Years of education: Not on file   Highest education level: 11th grade  Occupational History   Not on file  Tobacco Use   Smoking status: Every Day    Current packs/day: 0.30    Average packs/day: 0.3 packs/day for 40.0 years (12.0 ttl pk-yrs)    Types: Cigarettes   Smokeless tobacco: Never   Tobacco comments:    Working on quitting  Substance and Sexual Activity   Alcohol use: No   Drug use: No   Sexual activity: Not Currently  Other Topics  Concern   Not on file  Social History Narrative   Lives with 33 year old mother -caregiver    2 grown children   Son Perley Jain in [place]    Daughter Victorino Dike in [place] with daughter Maralyn Sago       Enjoys: shopping, play games       Diet: not as good a she would like, eats all food groups   Caffeine: 2 cups of coffee in the mornings, coke daily   Water: 3-4 cups daily       Wear seat belt    Smoke detectors at home   Does not use phone while driving      Social Determinants of Health   Financial Resource Strain: Low Risk  (06/05/2021)   Overall Financial Resource Strain (CARDIA)    Difficulty of Paying Living Expenses: Not hard at all  Food Insecurity: No Food Insecurity (06/05/2021)   Hunger Vital Sign    Worried About Running Out of Food in the Last Year: Never true    Ran Out of Food in the Last Year: Never true  Transportation Needs: No Transportation Needs (06/05/2021)   PRAPARE - Transportation  Lack of Transportation (Medical): No    Lack of Transportation (Non-Medical): No  Physical Activity: Inactive (06/05/2021)   Exercise Vital Sign    Days of Exercise per Week: 0 days    Minutes of Exercise per Session: 0 min  Stress: No Stress Concern Present (06/05/2021)   [employer] of Occupational Health - Occupational Stress Questionnaire    Feeling of Stress : Not at all  Social Connections: Socially Isolated (06/05/2021)   Social Connection and Isolation Panel [NHANES]    Frequency of Communication with Friends and Family: Three times a week    Frequency of Social Gatherings with Friends and Family: Three times a week    Attends Religious Services: Never    Active Member of Clubs or Organizations: No    Attends Banker Meetings: Never    Marital Status: Divorced  Catering manager Violence: Not At Risk (06/01/2020)   Humiliation, Afraid, Rape, and Kick questionnaire    Fear of Current or Ex-Partner: No    Emotionally Abused: No     Physically Abused: No    Sexually Abused: No    Outpatient Medications Prior to Visit  Medication Sig Dispense Refill   Accu-Chek Softclix Lancets lancets Use to test BG twice daily 200 each 2   acetaminophen (TYLENOL) 500 MG tablet Take 500 mg by mouth every 6 (six) hours as needed. Pain.     atorvastatin (LIPITOR) 40 MG tablet Take 1 tablet (40 mg total) by mouth daily. 90 tablet 3   Blood Glucose Monitoring Suppl (ACCU-CHEK GUIDE) w/Device KIT Use to check BG twice daily 1 kit 0   Cholecalciferol 50 MCG (2000 UT) CAPS Take 1 capsule (2,000 Units total) by mouth daily with breakfast. 90 capsule 1   collagenase (SANTYL) ointment Apply 1 application topically daily. 30 g 2   glucose blood (ACCU-CHEK GUIDE) test strip 1 each by Other route 2 (two) times daily. Use as instructed 200 each 2   losartan-hydrochlorothiazide (HYZAAR) 100-25 MG tablet Take 1 tablet by mouth daily. 90 tablet 1   metFORMIN (GLUCOPHAGE) 1000 MG tablet TAKE ONE TABLET BY MOUTH TWICE DAILY WITH MEALS 60 tablet 0   omeprazole (PRILOSEC) 20 MG capsule Take 1 capsule (20 mg total) by mouth daily as needed. Heartburn 30 capsule 3   Semaglutide (RYBELSUS) 7 MG TABS Take 1 tablet (7 mg total) by mouth daily. 90 tablet 1   silver sulfADIAZINE (SILVADENE) 1 % cream Apply 1 application  topically 2 (two) times daily as needed.     No facility-administered medications prior to visit.    Allergies  Allergen Reactions   Sulfamethoxazole-Trimethoprim Itching and Rash    Rash all over body, itching    ROS Review of Systems  Constitutional:  Negative for chills and fever.  Eyes:  Negative for visual disturbance.  Respiratory:  Negative for chest tightness and shortness of breath.   Neurological:  Negative for dizziness and headaches.      Objective:    Physical Exam HENT:     Head: Normocephalic.     Mouth/Throat:     Mouth: Mucous membranes are moist.  Cardiovascular:     Rate and Rhythm: Normal rate.     Heart  sounds: Normal heart sounds.  Pulmonary:     Effort: Pulmonary effort is normal.     Breath sounds: Normal breath sounds.  Neurological:     Mental Status: She is alert.     BP 124/67   Pulse 73   Ht  5\' 6"  (1.676 m)   Wt 160 lb 1.3 oz (72.6 kg)   SpO2 98%   BMI 25.84 kg/m  Wt Readings from Last 3 Encounters:  08/24/22 160 lb 1.3 oz (72.6 kg)  06/05/22 167 lb (75.8 kg)  05/25/22 167 lb 0.6 oz (75.8 kg)    Lab Results  Component Value Date   TSH 4.170 05/25/2022   Lab Results  Component Value Date   WBC 8.5 05/25/2022   HGB 13.0 05/25/2022   HCT 39.6 05/25/2022   MCV 86 05/25/2022   PLT 392 05/25/2022   Lab Results  Component Value Date   NA 140 05/25/2022   K 4.6 05/25/2022   CO2 23 05/25/2022   GLUCOSE 169 (H) 05/25/2022   BUN 25 05/25/2022   CREATININE 0.98 05/25/2022   BILITOT 0.3 05/25/2022   ALKPHOS 143 (H) 05/25/2022   AST 17 05/25/2022   ALT 19 05/25/2022   PROT 7.2 05/25/2022   ALBUMIN 4.0 05/25/2022   CALCIUM 9.9 05/25/2022   ANIONGAP 12 02/07/2021   EGFR 63 05/25/2022   Lab Results  Component Value Date   CHOL 125 05/25/2022   Lab Results  Component Value Date   HDL 39 (L) 05/25/2022   Lab Results  Component Value Date   LDLCALC 61 05/25/2022   Lab Results  Component Value Date   TRIG 143 05/25/2022   Lab Results  Component Value Date   CHOLHDL 3.2 05/25/2022   Lab Results  Component Value Date   HGBA1C 7.0 (H) 05/25/2022      Assessment & Plan:  Essential hypertension, benign Assessment & Plan: Controlled She takes losarta-hydrochlorothiazide 125 mg daily Denies headaches, dizziness, blurred vision, chest pain, palpitation Low-sodium diet with increased physical activity encouraged BP Readings from Last 3 Encounters:  08/24/22 124/67  06/27/22 126/68  06/05/22 133/77      Hyperlipidemia associated with type 2 diabetes mellitus (HCC) Assessment & Plan: She takes atorvastatin 40 mg daily Denies muscle aches and  pains Pending lipid panel today Lab Results  Component Value Date   CHOL 125 05/25/2022   HDL 39 (L) 05/25/2022   LDLCALC 61 05/25/2022   TRIG 143 05/25/2022   CHOLHDL 3.2 05/25/2022      Gastroesophageal reflux disease without esophagitis Assessment & Plan: She takes omeprazole 20 mg daily No reports of gastric reflux or heartburn reported Encouraged to continue therapy   Uncontrolled type 2 diabetes mellitus with hyperglycemia (HCC) Assessment & Plan: She takes glipizide 5 mg daily metformin 1000 mg twice daily Denies polyuria, polyphagia, polydipsia Pending hemoglobin A1c today Lab Results  Component Value Date   HGBA1C 7.0 (H) 05/25/2022     Orders: -     Microalbumin / creatinine urine ratio -     Hemoglobin A1c  Breast cancer screening by mammogram -     3D Screening Mammogram, Left and Right  Vitamin D deficiency -     VITAMIN D 25 Hydroxy (Vit-D Deficiency, Fractures)  Other specified hypothyroidism -     TSH + free T4  Other hyperlipidemia -     Lipid panel -     CMP14+EGFR -     CBC with Differential/Platelet  Note: This chart has been completed using Engineer, civil (consulting) software, and while attempts have been made to ensure accuracy, certain words and phrases may not be transcribed as intended.    Follow-up: Return in about 3 months (around 11/24/2022).   Gilmore Laroche, FNP

## 2022-08-24 NOTE — Patient Instructions (Addendum)
I appreciate the opportunity to provide care to you today!    Follow up:  3 months  Labs: please stop by the lab today to get your blood drawn (CBC, CMP, TSH, Lipid profile, HgA1c, Vit D)  Please schedule screening mammogram  Please stop by your local pharmacy and get your Shingles vaccine  Attached with your AVS, you will find valuable resources for self-education. I highly recommend dedicating some time to thoroughly examine them.   Please continue to a heart-healthy diet and increase your physical activities. Try to exercise for at least five days a week.    It was a pleasure to see you and I look forward to continuing to work together on your health and well-being. Please do not hesitate to call the office if you need care or have questions about your care.  In case of emergency, please visit the Emergency Department for urgent care, or contact our clinic at [phone] to schedule an appointment. We're here to help you!   Have a wonderful day and week. With Gratitude, Gilmore Laroche MSN, FNP-BC

## 2022-08-25 LAB — CBC WITH DIFFERENTIAL/PLATELET
Basos: 1 %
EOS (ABSOLUTE): 0.1 10*3/uL (ref 0.0–0.4)
Hemoglobin: 13.8 g/dL (ref 11.1–15.9)
Immature Grans (Abs): 0 10*3/uL (ref 0.0–0.1)
Immature Granulocytes: 0 %
Lymphocytes Absolute: 2.2 10*3/uL (ref 0.7–3.1)
Neutrophils: 66 %
Platelets: 354 10*3/uL (ref 150–450)

## 2022-08-25 LAB — TSH+FREE T4: Free T4: 1.3 ng/dL (ref 0.82–1.77)

## 2022-08-25 LAB — MICROALBUMIN / CREATININE URINE RATIO

## 2022-08-25 LAB — CMP14+EGFR
BUN: 13 mg/dL (ref 8–27)
CO2: 27 mmol/L (ref 20–29)
Calcium: 9.6 mg/dL (ref 8.7–10.3)
Chloride: 97 mmol/L (ref 96–106)
Globulin, Total: 3 g/dL (ref 1.5–4.5)
Sodium: 140 mmol/L (ref 134–144)
Total Protein: 7.2 g/dL (ref 6.0–8.5)

## 2022-08-25 LAB — LIPID PANEL
Chol/HDL Ratio: 2.7 ratio (ref 0.0–4.4)
LDL Chol Calc (NIH): 64 mg/dL (ref 0–99)
Triglycerides: 109 mg/dL (ref 0–149)

## 2022-08-25 NOTE — Assessment & Plan Note (Signed)
She takes atorvastatin 40 mg daily Denies muscle aches and pains Pending lipid panel today Lab Results  Component Value Date   CHOL 125 05/25/2022   HDL 39 (L) 05/25/2022   LDLCALC 61 05/25/2022   TRIG 143 05/25/2022   CHOLHDL 3.2 05/25/2022    

## 2022-08-25 NOTE — Assessment & Plan Note (Signed)
She takes omeprazole 20 mg daily No reports of gastric reflux or heartburn reported Encouraged to continue therapy 

## 2022-08-25 NOTE — Assessment & Plan Note (Signed)
Controlled She takes losarta-hydrochlorothiazide 125 mg daily Denies headaches, dizziness, blurred vision, chest pain, palpitation Low-sodium diet with increased physical activity encouraged BP Readings from Last 3 Encounters:  08/24/22 124/67  06/27/22 126/68  06/05/22 133/77

## 2022-08-25 NOTE — Assessment & Plan Note (Signed)
She takes glipizide 5 mg daily metformin 1000 mg twice daily Denies polyuria, polyphagia, polydipsia Pending hemoglobin A1c today Lab Results  Component Value Date   HGBA1C 7.0 (H) 05/25/2022    

## 2022-08-25 NOTE — Progress Notes (Signed)
Please inform the patient that her hga1c has decreased from 7.0 to 6.8. keep up the good work! No changes to treatment regimen. I recommend decreasing her intake of high sugar foods and beverages. All other labs are stable

## 2022-08-26 LAB — CMP14+EGFR
ALT: 19 IU/L (ref 0–32)
AST: 22 IU/L (ref 0–40)
Albumin: 4.2 g/dL (ref 3.9–4.9)
Alkaline Phosphatase: 132 IU/L — ABNORMAL HIGH (ref 44–121)
BUN/Creatinine Ratio: 15 (ref 12–28)
Bilirubin Total: 0.3 mg/dL (ref 0.0–1.2)
Creatinine, Ser: 0.87 mg/dL (ref 0.57–1.00)
Glucose: 150 mg/dL — ABNORMAL HIGH (ref 70–99)
Potassium: 4.8 mmol/L (ref 3.5–5.2)
eGFR: 73 mL/min/{1.73_m2} (ref >59)

## 2022-08-26 LAB — CBC WITH DIFFERENTIAL/PLATELET
Basophils Absolute: 0.1 10*3/uL (ref 0.0–0.2)
Eos: 2 %
Hematocrit: 41.6 % (ref 34.0–46.6)
Lymphs: 25 %
MCH: 28.8 pg (ref 26.6–33.0)
MCHC: 33.2 g/dL (ref 31.5–35.7)
MCV: 87 fL (ref 79–97)
Monocytes Absolute: 0.5 10*3/uL (ref 0.1–0.9)
Monocytes: 6 %
Neutrophils Absolute: 5.8 10*3/uL (ref 1.4–7.0)
RBC: 4.79 x10E6/uL (ref 3.77–5.28)
RDW: 13.7 % (ref 11.7–15.4)
WBC: 8.7 10*3/uL (ref 3.4–10.8)

## 2022-08-26 LAB — LIPID PANEL
Cholesterol, Total: 133 mg/dL (ref 100–199)
HDL: 49 mg/dL (ref >39)
VLDL Cholesterol Cal: 20 mg/dL (ref 5–40)

## 2022-08-26 LAB — TSH+FREE T4: TSH: 3.09 u[IU]/mL (ref 0.450–4.500)

## 2022-08-26 LAB — HEMOGLOBIN A1C
Est. average glucose Bld gHb Est-mCnc: 148 mg/dL
Hgb A1c MFr Bld: 6.8 % — ABNORMAL HIGH (ref 4.8–5.6)

## 2022-08-26 LAB — VITAMIN D 25 HYDROXY (VIT D DEFICIENCY, FRACTURES): Vit D, 25-Hydroxy: 73.5 ng/mL (ref 30.0–100.0)

## 2022-08-26 LAB — MICROALBUMIN / CREATININE URINE RATIO: Microalb/Creat Ratio: 249 mg/g creat — ABNORMAL HIGH (ref 0–29)

## 2022-08-27 ENCOUNTER — Other Ambulatory Visit: Payer: Self-pay | Admitting: Family Medicine

## 2022-08-27 DIAGNOSIS — E1165 Type 2 diabetes mellitus with hyperglycemia: Secondary | ICD-10-CM

## 2022-08-27 MED ORDER — DAPAGLIFLOZIN PROPANEDIOL 10 MG PO TABS
10.0000 mg | ORAL_TABLET | Freq: Every day | ORAL | 2 refills | Status: DC
Start: 2022-08-27 — End: 2022-11-26

## 2022-08-27 NOTE — Progress Notes (Signed)
Please inform the patient that a prescription for Farxiga 10 mg is sent to her pharmacy to preserve her kidney function.  Her microalbumin creatinine levels were elevated.

## 2022-09-06 DIAGNOSIS — L97322 Non-pressure chronic ulcer of left ankle with fat layer exposed: Secondary | ICD-10-CM | POA: Diagnosis not present

## 2022-09-25 DIAGNOSIS — L97322 Non-pressure chronic ulcer of left ankle with fat layer exposed: Secondary | ICD-10-CM | POA: Diagnosis not present

## 2022-10-16 DIAGNOSIS — L97322 Non-pressure chronic ulcer of left ankle with fat layer exposed: Secondary | ICD-10-CM | POA: Diagnosis not present

## 2022-10-23 ENCOUNTER — Ambulatory Visit: Payer: Medicare Other

## 2022-10-24 ENCOUNTER — Ambulatory Visit (INDEPENDENT_AMBULATORY_CARE_PROVIDER_SITE_OTHER): Payer: Medicare Other

## 2022-10-24 DIAGNOSIS — E1165 Type 2 diabetes mellitus with hyperglycemia: Secondary | ICD-10-CM

## 2022-10-24 DIAGNOSIS — E119 Type 2 diabetes mellitus without complications: Secondary | ICD-10-CM

## 2022-10-24 DIAGNOSIS — Z794 Long term (current) use of insulin: Secondary | ICD-10-CM

## 2022-10-24 LAB — HM DIABETES EYE EXAM

## 2022-10-24 NOTE — Progress Notes (Signed)
Kim [name] arrived 10/24/2022 and has given verbal consent to obtain images and complete their overdue diabetic retinal screening.  The images have been sent to an ophthalmologist or optometrist for review and interpretation.  Results will be sent back to Gilmore Laroche, FNP for review.  Patient has been informed they will be contacted when we receive the results via telephone or MyChart

## 2022-11-01 ENCOUNTER — Other Ambulatory Visit: Payer: Self-pay | Admitting: "Endocrinology

## 2022-11-06 DIAGNOSIS — L97322 Non-pressure chronic ulcer of left ankle with fat layer exposed: Secondary | ICD-10-CM | POA: Diagnosis not present

## 2022-11-23 ENCOUNTER — Other Ambulatory Visit: Payer: Self-pay | Admitting: Family Medicine

## 2022-11-23 ENCOUNTER — Encounter: Payer: Self-pay | Admitting: Family Medicine

## 2022-11-23 ENCOUNTER — Ambulatory Visit (INDEPENDENT_AMBULATORY_CARE_PROVIDER_SITE_OTHER): Payer: Medicare Other | Admitting: Family Medicine

## 2022-11-23 VITALS — BP 128/81 | HR 94 | Ht 66.0 in | Wt 158.1 lb

## 2022-11-23 DIAGNOSIS — E7849 Other hyperlipidemia: Secondary | ICD-10-CM

## 2022-11-23 DIAGNOSIS — E1165 Type 2 diabetes mellitus with hyperglycemia: Secondary | ICD-10-CM | POA: Diagnosis not present

## 2022-11-23 DIAGNOSIS — Z7984 Long term (current) use of oral hypoglycemic drugs: Secondary | ICD-10-CM | POA: Diagnosis not present

## 2022-11-23 DIAGNOSIS — E559 Vitamin D deficiency, unspecified: Secondary | ICD-10-CM | POA: Diagnosis not present

## 2022-11-23 DIAGNOSIS — R7301 Impaired fasting glucose: Secondary | ICD-10-CM

## 2022-11-23 DIAGNOSIS — E1169 Type 2 diabetes mellitus with other specified complication: Secondary | ICD-10-CM

## 2022-11-23 DIAGNOSIS — I10 Essential (primary) hypertension: Secondary | ICD-10-CM

## 2022-11-23 DIAGNOSIS — E785 Hyperlipidemia, unspecified: Secondary | ICD-10-CM

## 2022-11-23 NOTE — Patient Instructions (Signed)
I appreciate the opportunity to provide care to you today!    Follow up:  4 months  Labs: please stop by the lab today to get your blood drawn (CBC, CMP, TSH, Lipid profile, HgA1c, Vit D)   Attached with your AVS, you will find valuable resources for self-education. I highly recommend dedicating some time to thoroughly examine them.   Please continue to a heart-healthy diet and increase your physical activities. Try to exercise for 30mins at least five days a week.    It was a pleasure to see you and I look forward to continuing to work together on your health and well-being. Please do not hesitate to call the office if you need care or have questions about your care.  In case of emergency, please visit the Emergency Department for urgent care, or contact our clinic at [phone] to schedule an appointment. We're here to help you!   Have a wonderful day and week. With Gratitude, Kylan Veach MSN, FNP-BC  

## 2022-11-23 NOTE — Assessment & Plan Note (Signed)
Encouraged to continue taking Rybelsus 7 mg daily, metformin 1000 mg twice daily, and Farxiga 10 mg daily. Encouraged to decrease her intake of high sugar food and beverages with increase physical activity Lab Results  Component Value Date   HGBA1C 6.8 (H) 08/24/2022

## 2022-11-23 NOTE — Assessment & Plan Note (Signed)
She takes atorvastatin 40 mg daily Denies muscle aches and pains Encouraged decreasing her intake of greasy, fatty, starchy foods with increased physical activity Lab Results  Component Value Date   CHOL 133 08/24/2022   HDL 49 08/24/2022   LDLCALC 64 08/24/2022   TRIG 109 08/24/2022   CHOLHDL 2.7 08/24/2022

## 2022-11-23 NOTE — Assessment & Plan Note (Signed)
Controlled She takes losarta-hydrochlorothiazide 125 mg daily Denies headaches, dizziness, blurred vision, chest pain, palpitation Low-sodium diet with increased physical activity encouraged BP Readings from Last 3 Encounters:  11/23/22 128/81  08/24/22 124/67  06/27/22 126/68

## 2022-11-23 NOTE — Progress Notes (Signed)
Established Patient Office Visit  Subjective:  Patient ID: Kim [name], female    DOB: 02-Aug-1955  Age: 67 y.o. MRN: [id number]  CC:  Chief Complaint  Patient presents with   Hypertension    F/u    HPI Kim [name] is a 67 y.o. female with past medical history of hypertension, hyperlipidemia, type 2 diabetes presents for f/u of  chronic medical conditions.  Hypertension: She takes losartan-hydrochlorothiazide 100-25 milligrams daily and reports treatment compliance.  She denies headaches, dizziness, blurred vision, chest pain, palpitation.  Hyperlipidemia: She is on atorvastatin 40 mg daily and reports treatment compliance.  No muscle aches or pain reported.  Type 2 diabetes: She takes Rybelsus 7 mg daily, metformin 1000 mg twice daily, and Farxiga 10 mg daily.  She denies polyuria, polyphagia,  and polydipsia.    Past Medical History:  Diagnosis Date   Cancer [facility])    history of leukemia   Community acquired pneumonia 05/26/2013   Diabetes mellitus without complication (HCC)    Encounter for screening mammogram for malignant neoplasm of breast 03/26/2019   Hypercholesteremia    Hypertension    Neuropathy    Seasonal allergies     Past Surgical History:  Procedure Laterality Date   COLONOSCOPY  12/28/2011   Procedure: COLONOSCOPY;  Surgeon: West Bali, MD;  [facility];  Service: Endoscopy;  Laterality: N/A;  9:30 AM-changed to 8:30 [name] to notify pt   FOOT SURGERY Left    LAPAROSCOPY     TUBAL LIGATION     TUNNELED VENOUS CATHETER PLACEMENT      Family History  Problem Relation Age of Onset   Hypothyroidism Mother    Diabetes Mother    Hypertension Mother    Thyroid disease Mother    Hypertension Father    Stroke Father    Colon cancer Neg Hx     Social History   Socioeconomic History   Marital status: Divorced    Spouse name: Not on file   Number of children: 2   Years of education: Not on file   Highest education level: 11th  grade  Occupational History   Not on file  Tobacco Use   Smoking status: Every Day    Current packs/day: 0.30    Average packs/day: 0.3 packs/day for 40.0 years (12.0 ttl pk-yrs)    Types: Cigarettes   Smokeless tobacco: Never   Tobacco comments:    Working on quitting  Substance and Sexual Activity   Alcohol use: No   Drug use: No   Sexual activity: Not Currently  Other Topics Concern   Not on file  Social History Narrative   Lives with 58 year old mother -caregiver    2 grown children   Son Kim [name] in [place]    Daughter Kim [name] in [place] with daughter Kim [name]       Enjoys: shopping, play games       Diet: not as good a she would like, eats all food groups   Caffeine: 2 cups of coffee in the mornings, coke daily   Water: 3-4 cups daily       Wear seat belt    Smoke detectors at home   Does not use phone while driving      Social Determinants of Health   Financial Resource Strain: Low Risk  (06/05/2021)   Overall Financial Resource Strain (CARDIA)    Difficulty of Paying Living Expenses: Not hard at all  Food Insecurity:  No Food Insecurity (06/05/2021)   Hunger Vital Sign    Worried About Running Out of Food in the Last Year: Never true    Ran Out of Food in the Last Year: Never true  Transportation Needs: No Transportation Needs (06/05/2021)   PRAPARE - Administrator, Civil Service (Medical): No    Lack of Transportation (Non-Medical): No  Physical Activity: Inactive (06/05/2021)   Exercise Vital Sign    Days of Exercise per Week: 0 days    Minutes of Exercise per Session: 0 min  Stress: No Stress Concern Present (06/05/2021)   [employer] of Occupational Health - Occupational Stress Questionnaire    Feeling of Stress : Not at all  Social Connections: Socially Isolated (06/05/2021)   Social Connection and Isolation Panel [NHANES]    Frequency of Communication with Friends and Family: Three times a week    Frequency of Social  Gatherings with Friends and Family: Three times a week    Attends Religious Services: Never    Active Member of Clubs or Organizations: No    Attends Banker Meetings: Never    Marital Status: Divorced  Catering manager Violence: Not At Risk (06/01/2020)   Humiliation, Afraid, Rape, and Kick questionnaire    Fear of Current or Ex-Partner: No    Emotionally Abused: No    Physically Abused: No    Sexually Abused: No    Outpatient Medications Prior to Visit  Medication Sig Dispense Refill   Accu-Chek Softclix Lancets lancets Use to test BG twice daily 200 each 2   acetaminophen (TYLENOL) 500 MG tablet Take 500 mg by mouth every 6 (six) hours as needed. Pain.     atorvastatin (LIPITOR) 40 MG tablet Take 1 tablet (40 mg total) by mouth daily. 90 tablet 3   Blood Glucose Monitoring Suppl (ACCU-CHEK GUIDE) w/Device KIT Use to check BG twice daily 1 kit 0   Cholecalciferol 50 MCG (2000 UT) CAPS Take 1 capsule (2,000 Units total) by mouth daily with breakfast. 90 capsule 1   collagenase (SANTYL) ointment Apply 1 application topically daily. 30 g 2   dapagliflozin propanediol (FARXIGA) 10 MG TABS tablet Take 1 tablet (10 mg total) by mouth daily before breakfast. 30 tablet 2   glucose blood (ACCU-CHEK GUIDE) test strip 1 each by Other route 2 (two) times daily. Use as instructed 200 each 2   losartan-hydrochlorothiazide (HYZAAR) 100-25 MG tablet TAKE ONE TABLET BY MOUTH ONCE DAILY 90 tablet 1   metFORMIN (GLUCOPHAGE) 1000 MG tablet TAKE 1 TABLET BY MOUTH TWICE A DAY WITH MEALS 180 tablet 0   omeprazole (PRILOSEC) 20 MG capsule Take 1 capsule (20 mg total) by mouth daily as needed. Heartburn 30 capsule 3   Semaglutide (RYBELSUS) 7 MG TABS Take 1 tablet (7 mg total) by mouth daily. 90 tablet 1   silver sulfADIAZINE (SILVADENE) 1 % cream Apply 1 application  topically 2 (two) times daily as needed.     No facility-administered medications prior to visit.    Allergies  Allergen  Reactions   Sulfamethoxazole-Trimethoprim Itching and Rash    Rash all over body, itching    ROS Review of Systems  Constitutional:  Negative for chills and fever.  Eyes:  Negative for visual disturbance.  Respiratory:  Negative for chest tightness and shortness of breath.   Neurological:  Negative for dizziness and headaches.      Objective:    Physical Exam HENT:     Head: Normocephalic.  Mouth/Throat:     Mouth: Mucous membranes are moist.  Cardiovascular:     Rate and Rhythm: Normal rate.     Heart sounds: Normal heart sounds.  Pulmonary:     Effort: Pulmonary effort is normal.     Breath sounds: Normal breath sounds.  Neurological:     Mental Status: She is alert.     BP 128/81 (BP Location: Left Arm, Patient Position: Sitting, Cuff Size: Normal)   Pulse 94   Ht 5\' 6"  (1.676 m)   Wt 158 lb 1.9 oz (71.7 kg)   SpO2 96%   BMI 25.52 kg/m  Wt Readings from Last 3 Encounters:  11/23/22 158 lb 1.9 oz (71.7 kg)  08/24/22 160 lb 1.3 oz (72.6 kg)  06/05/22 167 lb (75.8 kg)    Lab Results  Component Value Date   TSH 3.090 08/24/2022   Lab Results  Component Value Date   WBC 8.7 08/24/2022   HGB 13.8 08/24/2022   HCT 41.6 08/24/2022   MCV 87 08/24/2022   PLT 354 08/24/2022   Lab Results  Component Value Date   NA 140 08/24/2022   K 4.8 08/24/2022   CO2 27 08/24/2022   GLUCOSE 150 (H) 08/24/2022   BUN 13 08/24/2022   CREATININE 0.87 08/24/2022   BILITOT 0.3 08/24/2022   ALKPHOS 132 (H) 08/24/2022   AST 22 08/24/2022   ALT 19 08/24/2022   PROT 7.2 08/24/2022   ALBUMIN 4.2 08/24/2022   CALCIUM 9.6 08/24/2022   ANIONGAP 12 02/07/2021   EGFR 73 08/24/2022   Lab Results  Component Value Date   CHOL 133 08/24/2022   Lab Results  Component Value Date   HDL 49 08/24/2022   Lab Results  Component Value Date   LDLCALC 64 08/24/2022   Lab Results  Component Value Date   TRIG 109 08/24/2022   Lab Results  Component Value Date   CHOLHDL 2.7  08/24/2022   Lab Results  Component Value Date   HGBA1C 6.8 (H) 08/24/2022      Assessment & Plan:  Essential hypertension, benign Assessment & Plan: Controlled She takes losarta-hydrochlorothiazide 125 mg daily Denies headaches, dizziness, blurred vision, chest pain, palpitation Low-sodium diet with increased physical activity encouraged BP Readings from Last 3 Encounters:  11/23/22 128/81  08/24/22 124/67  06/27/22 126/68     Orders: -     TSH + free T4  Hyperlipidemia associated with type 2 diabetes mellitus (HCC) Assessment & Plan: She takes atorvastatin 40 mg daily Denies muscle aches and pains Encouraged decreasing her intake of greasy, fatty, starchy foods with increased physical activity Lab Results  Component Value Date   CHOL 133 08/24/2022   HDL 49 08/24/2022   LDLCALC 64 08/24/2022   TRIG 109 08/24/2022   CHOLHDL 2.7 08/24/2022      Uncontrolled type 2 diabetes mellitus with hyperglycemia (HCC) Assessment & Plan: Encouraged to continue taking Rybelsus 7 mg daily, metformin 1000 mg twice daily, and Farxiga 10 mg daily. Encouraged to decrease her intake of high sugar food and beverages with increase physical activity Lab Results  Component Value Date   HGBA1C 6.8 (H) 08/24/2022      IFG (impaired fasting glucose) -     Hemoglobin A1c  Vitamin D deficiency -     VITAMIN D 25 Hydroxy (Vit-D Deficiency, Fractures)  Other hyperlipidemia -     Lipid panel -     CMP14+EGFR -     CBC with Differential/Platelet  Note: This  chart has been completed using Engineer, civil (consulting) software, and while attempts have been made to ensure accuracy, certain words and phrases may not be transcribed as intended.    Follow-up: Return in about 4 months (around 03/26/2023).   Gilmore Laroche, FNP

## 2022-11-24 LAB — CMP14+EGFR
ALT: 18 [IU]/L (ref 0–32)
AST: 19 [IU]/L (ref 0–40)
Albumin: 4.2 g/dL (ref 3.9–4.9)
Alkaline Phosphatase: 129 [IU]/L — ABNORMAL HIGH (ref 44–121)
BUN/Creatinine Ratio: 16 (ref 12–28)
BUN: 17 mg/dL (ref 8–27)
Bilirubin Total: 0.3 mg/dL (ref 0.0–1.2)
CO2: 27 mmol/L (ref 20–29)
Calcium: 9.4 mg/dL (ref 8.7–10.3)
Chloride: 98 mmol/L (ref 96–106)
Creatinine, Ser: 1.07 mg/dL — ABNORMAL HIGH (ref 0.57–1.00)
Globulin, Total: 2.5 g/dL (ref 1.5–4.5)
Glucose: 135 mg/dL — ABNORMAL HIGH (ref 70–99)
Potassium: 4.5 mmol/L (ref 3.5–5.2)
Sodium: 141 mmol/L (ref 134–144)
Total Protein: 6.7 g/dL (ref 6.0–8.5)
eGFR: 57 mL/min/{1.73_m2} — ABNORMAL LOW (ref >59)

## 2022-11-24 LAB — CBC WITH DIFFERENTIAL/PLATELET
Basophils Absolute: 0.1 10*3/uL (ref 0.0–0.2)
Basos: 1 %
EOS (ABSOLUTE): 0.2 10*3/uL (ref 0.0–0.4)
Eos: 3 %
Hematocrit: 44.3 % (ref 34.0–46.6)
Hemoglobin: 14.3 g/dL (ref 11.1–15.9)
Immature Grans (Abs): 0 10*3/uL (ref 0.0–0.1)
Immature Granulocytes: 0 %
Lymphocytes Absolute: 2.2 10*3/uL (ref 0.7–3.1)
Lymphs: 29 %
MCH: 29.7 pg (ref 26.6–33.0)
MCHC: 32.3 g/dL (ref 31.5–35.7)
MCV: 92 fL (ref 79–97)
Monocytes Absolute: 0.5 10*3/uL (ref 0.1–0.9)
Monocytes: 6 %
Neutrophils Absolute: 4.5 10*3/uL (ref 1.4–7.0)
Neutrophils: 61 %
Platelets: 311 10*3/uL (ref 150–450)
RBC: 4.82 x10E6/uL (ref 3.77–5.28)
RDW: 13.2 % (ref 11.7–15.4)
WBC: 7.4 10*3/uL (ref 3.4–10.8)

## 2022-11-24 LAB — LIPID PANEL
Chol/HDL Ratio: 3.1 {ratio} (ref 0.0–4.4)
Cholesterol, Total: 133 mg/dL (ref 100–199)
HDL: 43 mg/dL (ref >39)
LDL Chol Calc (NIH): 69 mg/dL (ref 0–99)
Triglycerides: 115 mg/dL (ref 0–149)
VLDL Cholesterol Cal: 21 mg/dL (ref 5–40)

## 2022-11-24 LAB — TSH+FREE T4
Free T4: 1.38 ng/dL (ref 0.82–1.77)
TSH: 2.88 u[IU]/mL (ref 0.450–4.500)

## 2022-11-24 LAB — HEMOGLOBIN A1C
Est. average glucose Bld gHb Est-mCnc: 148 mg/dL
Hgb A1c MFr Bld: 6.8 % — ABNORMAL HIGH (ref 4.8–5.6)

## 2022-11-24 LAB — VITAMIN D 25 HYDROXY (VIT D DEFICIENCY, FRACTURES): Vit D, 25-Hydroxy: 80.2 ng/mL (ref 30.0–100.0)

## 2022-11-26 ENCOUNTER — Other Ambulatory Visit: Payer: Self-pay | Admitting: Family Medicine

## 2022-11-26 DIAGNOSIS — E1165 Type 2 diabetes mellitus with hyperglycemia: Secondary | ICD-10-CM

## 2022-12-04 DIAGNOSIS — L97322 Non-pressure chronic ulcer of left ankle with fat layer exposed: Secondary | ICD-10-CM | POA: Diagnosis not present

## 2023-01-01 DIAGNOSIS — L97322 Non-pressure chronic ulcer of left ankle with fat layer exposed: Secondary | ICD-10-CM | POA: Diagnosis not present

## 2023-01-09 ENCOUNTER — Ambulatory Visit: Payer: Medicare Other | Admitting: "Endocrinology

## 2023-01-14 ENCOUNTER — Other Ambulatory Visit: Payer: Self-pay | Admitting: "Endocrinology

## 2023-01-14 DIAGNOSIS — E1165 Type 2 diabetes mellitus with hyperglycemia: Secondary | ICD-10-CM

## 2023-01-21 ENCOUNTER — Other Ambulatory Visit: Payer: Self-pay | Admitting: "Endocrinology

## 2023-01-21 DIAGNOSIS — E1165 Type 2 diabetes mellitus with hyperglycemia: Secondary | ICD-10-CM

## 2023-01-22 DIAGNOSIS — L97322 Non-pressure chronic ulcer of left ankle with fat layer exposed: Secondary | ICD-10-CM | POA: Diagnosis not present

## 2023-01-23 ENCOUNTER — Other Ambulatory Visit: Payer: Self-pay

## 2023-01-23 DIAGNOSIS — E1165 Type 2 diabetes mellitus with hyperglycemia: Secondary | ICD-10-CM

## 2023-01-23 MED ORDER — GE100 BLOOD GLUCOSE TEST VI STRP: ORAL_STRIP | 1 refills | Status: DC

## 2023-01-28 ENCOUNTER — Other Ambulatory Visit: Payer: Self-pay | Admitting: "Endocrinology

## 2023-02-19 DIAGNOSIS — L97322 Non-pressure chronic ulcer of left ankle with fat layer exposed: Secondary | ICD-10-CM | POA: Diagnosis not present

## 2023-02-25 ENCOUNTER — Other Ambulatory Visit: Payer: Self-pay | Admitting: Family Medicine

## 2023-02-25 DIAGNOSIS — E1165 Type 2 diabetes mellitus with hyperglycemia: Secondary | ICD-10-CM

## 2023-03-05 DIAGNOSIS — L97322 Non-pressure chronic ulcer of left ankle with fat layer exposed: Secondary | ICD-10-CM | POA: Diagnosis not present

## 2023-03-12 DIAGNOSIS — L97322 Non-pressure chronic ulcer of left ankle with fat layer exposed: Secondary | ICD-10-CM | POA: Diagnosis not present

## 2023-03-19 DIAGNOSIS — L97322 Non-pressure chronic ulcer of left ankle with fat layer exposed: Secondary | ICD-10-CM | POA: Diagnosis not present

## 2023-03-27 ENCOUNTER — Ambulatory Visit: Payer: Medicare Other | Admitting: "Endocrinology

## 2023-03-28 ENCOUNTER — Ambulatory Visit: Payer: Medicare Other | Admitting: Family Medicine

## 2023-04-09 DIAGNOSIS — L97321 Non-pressure chronic ulcer of left ankle limited to breakdown of skin: Secondary | ICD-10-CM | POA: Diagnosis not present

## 2023-04-12 ENCOUNTER — Encounter: Payer: Self-pay | Admitting: "Endocrinology

## 2023-04-12 ENCOUNTER — Ambulatory Visit (INDEPENDENT_AMBULATORY_CARE_PROVIDER_SITE_OTHER): Payer: Medicare Other | Admitting: "Endocrinology

## 2023-04-12 VITALS — BP 106/68 | HR 96

## 2023-04-12 DIAGNOSIS — E782 Mixed hyperlipidemia: Secondary | ICD-10-CM

## 2023-04-12 DIAGNOSIS — I10 Essential (primary) hypertension: Secondary | ICD-10-CM

## 2023-04-12 DIAGNOSIS — E063 Autoimmune thyroiditis: Secondary | ICD-10-CM

## 2023-04-12 DIAGNOSIS — E1165 Type 2 diabetes mellitus with hyperglycemia: Secondary | ICD-10-CM | POA: Diagnosis not present

## 2023-04-12 DIAGNOSIS — Z7984 Long term (current) use of oral hypoglycemic drugs: Secondary | ICD-10-CM | POA: Diagnosis not present

## 2023-04-12 LAB — POCT GLYCOSYLATED HEMOGLOBIN (HGB A1C): HbA1c, POC (controlled diabetic range): 6.7 % (ref 0.0–7.0)

## 2023-04-12 NOTE — Progress Notes (Signed)
 04/12/2023, 12:35 PM   Endocrinology follow-up note  Subjective:    Patient ID: [name], female    DOB: 1955/04/23.  ARIYONNA [name] is being seen in follow-up after she was seen in consultation for management of currently uncontrolled symptomatic diabetes requested by  Gilmore Laroche, FNP.   Past Medical History:  Diagnosis Date   Cancer [facility])    history of leukemia   Community acquired pneumonia 05/26/2013   Diabetes mellitus without complication (HCC)    Encounter for screening mammogram for malignant neoplasm of breast 03/26/2019   Hypercholesteremia    Hypertension    Neuropathy    Seasonal allergies     Past Surgical History:  Procedure Laterality Date   COLONOSCOPY  12/28/2011   Procedure: COLONOSCOPY;  Surgeon: West Bali, MD;  Location: [facility];  Service: Endoscopy;  Laterality: N/A;  9:30 AM-changed to 8:30 [name] to notify pt   FOOT SURGERY Left    LAPAROSCOPY     TUBAL LIGATION     TUNNELED VENOUS CATHETER PLACEMENT      Social History   Socioeconomic History   Marital status: Divorced    Spouse name: Not on file   Number of children: 2   Years of education: Not on file   Highest education level: 11th grade  Occupational History   Not on file  Tobacco Use   Smoking status: Every Day    Current packs/day: 0.30    Average packs/day: 0.3 packs/day for 40.0 years (12.0 ttl pk-yrs)    Types: Cigarettes   Smokeless tobacco: Never   Tobacco comments:    Working on quitting  Substance and Sexual Activity   Alcohol use: No   Drug use: No   Sexual activity: Not Currently  Other Topics Concern   Not on file  Social History Narrative   Lives with 71 year old mother -caregiver    2 grown children   Son Kim [name] in [place]    Daughter Kim [name] in [place] with daughter Maralyn Sago       Enjoys: shopping, play games       Diet: not as good a  she would like, eats all food groups   Caffeine: 2 cups of coffee in the mornings, coke daily   Water: 3-4 cups daily       Wear seat belt    Smoke detectors at home   Does not use phone while driving      Social Drivers of Corporate investment banker Strain: Low Risk  (06/05/2021)   Overall Financial Resource Strain (CARDIA)    Difficulty of Paying Living Expenses: Not hard at all  Food Insecurity: No Food Insecurity (06/05/2021)   Hunger Vital Sign    Worried About Running Out of Food in the Last Year: Never true    Ran Out of Food in the Last Year: Never true  Transportation Needs: No Transportation Needs (06/05/2021)   PRAPARE - Administrator, Civil Service (Medical): No    Lack of Transportation (Non-Medical): No  Physical Activity: Inactive (06/05/2021)  Exercise Vital Sign    Days of Exercise per Week: 0 days    Minutes of Exercise per Session: 0 min  Stress: No Stress Concern Present (06/05/2021)   [employer] of Occupational Health - Occupational Stress Questionnaire    Feeling of Stress : Not at all  Social Connections: Socially Isolated (06/05/2021)   Social Connection and Isolation Panel [NHANES]    Frequency of Communication with Friends and Family: Three times a week    Frequency of Social Gatherings with Friends and Family: Three times a week    Attends Religious Services: Never    Active Member of Clubs or Organizations: No    Attends Banker Meetings: Never    Marital Status: Divorced    Family History  Problem Relation Age of Onset   Hypothyroidism Mother    Diabetes Mother    Hypertension Mother    Thyroid disease Mother    Hypertension Father    Stroke Father    Colon cancer Neg Hx     Outpatient Encounter Medications as of 04/12/2023  Medication Sig   Accu-Chek Softclix Lancets lancets Use to test BG twice daily   acetaminophen (TYLENOL) 500 MG tablet Take 500 mg by mouth every 6 (six) hours as needed. Pain.    atorvastatin (LIPITOR) 40 MG tablet Take 1 tablet (40 mg total) by mouth daily.   Blood Glucose Monitoring Suppl (ACCU-CHEK GUIDE) w/Device KIT Use to check BG twice daily   Cholecalciferol 50 MCG (2000 UT) CAPS Take 1 capsule (2,000 Units total) by mouth daily with breakfast.   collagenase (SANTYL) ointment Apply 1 application topically daily.   FARXIGA 10 MG TABS tablet TAKE 1 TABLET(10 MG) BY MOUTH DAILY BEFORE BREAKFAST   glucose blood (GE100 BLOOD GLUCOSE TEST) test strip Use as instructed   losartan-hydrochlorothiazide (HYZAAR) 100-25 MG tablet TAKE ONE TABLET BY MOUTH ONCE DAILY   metFORMIN (GLUCOPHAGE) 1000 MG tablet TAKE 1 TABLET BY MOUTH TWICE DAILY WITH MEALS   omeprazole (PRILOSEC) 20 MG capsule Take 1 capsule (20 mg total) by mouth daily as needed. Heartburn   Semaglutide (RYBELSUS) 7 MG TABS TAKE 1 TABLET(7 MG) BY MOUTH DAILY   silver sulfADIAZINE (SILVADENE) 1 % cream Apply 1 application  topically 2 (two) times daily as needed.   No facility-administered encounter medications on file as of 04/12/2023.    ALLERGIES: Allergies  Allergen Reactions   Sulfamethoxazole-Trimethoprim Itching and Rash    Rash all over body, itching    VACCINATION STATUS: Immunization History  Administered Date(s) Administered   Fluad Quad(high Dose 65+) 10/25/2021   Influenza, Quadrivalent, Recombinant, Inj, Pf 12/06/2020   Moderna SARS-COV2 Booster Vaccination 06/09/2020   Moderna Sars-Covid-2 Vaccination 09/13/2019, 10/11/2019, 12/06/2020   Pfizer(Comirnaty)Fall Seasonal Vaccine 12 years and older 01/12/2022   Pneumococcal Polysaccharide-23 11/03/2010, 05/27/2013, 06/23/2019   Rsv, Bivalent, Protein Subunit Rsvpref,pf Verdis Frederickson) 12/02/2021   Td 08/12/2019   Tdap 08/12/2019    Diabetes She presents for her follow-up diabetic visit. She has type 2 diabetes mellitus. Onset time: She was diagnosed at approximate age of 39 years. Her disease course has been improving. There are no hypoglycemic  associated symptoms. Pertinent negatives for hypoglycemia include no confusion, headaches, pallor or seizures. Associated symptoms include blurred vision and fatigue. Pertinent negatives for diabetes include no chest pain, no polydipsia, no polyphagia and no polyuria. There are no hypoglycemic complications. Symptoms are improving. Diabetic complications include nephropathy. Risk factors for coronary artery disease include diabetes mellitus, dyslipidemia, hypertension, family history, tobacco exposure,  sedentary lifestyle and post-menopausal. Current diabetic treatment includes oral agent (monotherapy). Her weight is decreasing steadily. She is following a generally unhealthy diet. When asked about meal planning, she reported none. She has not had a previous visit with a dietitian. She rarely (She is wheelchair-bound due to deconditioning/disequilibrium.  ) participates in exercise. Her home blood glucose trend is decreasing steadily. Her breakfast blood glucose range is generally 130-140 mg/dl. Her bedtime blood glucose range is generally 140-180 mg/dl. Her overall blood glucose range is 140-180 mg/dl. Steward Drone presents with continued improvement in her glycemic profile.  Her point-of-care A1c is 6.7% generally improving from 11.5%.  She did not document any hypoglycemia lately.      ) An ACE inhibitor/angiotensin II receptor blocker is being taken. Eye exam is current.  Hyperlipidemia This is a chronic problem. The current episode started more than 1 year ago. Exacerbating diseases include diabetes. Pertinent negatives include no chest pain, myalgias or shortness of breath. Current antihyperlipidemic treatment includes statins. Risk factors for coronary artery disease include diabetes mellitus, hypertension, dyslipidemia, family history, post-menopausal and a sedentary lifestyle.  Hypertension This is a chronic problem. The current episode started more than 1 year ago. Associated symptoms include blurred  vision. Pertinent negatives include no chest pain, headaches, palpitations or shortness of breath. Risk factors for coronary artery disease include diabetes mellitus, dyslipidemia, family history, post-menopausal state, smoking/tobacco exposure and sedentary lifestyle.     Review of Systems  Constitutional:  Positive for fatigue. Negative for chills, fever and unexpected weight change.  HENT:  Negative for trouble swallowing and voice change.   Eyes:  Positive for blurred vision. Negative for visual disturbance.  Respiratory:  Negative for cough, shortness of breath and wheezing.   Cardiovascular:  Negative for chest pain, palpitations and leg swelling.  Gastrointestinal:  Negative for diarrhea, nausea and vomiting.  Endocrine: Negative for cold intolerance, heat intolerance, polydipsia, polyphagia and polyuria.  Musculoskeletal:  Positive for gait problem. Negative for arthralgias and myalgias.       She is wheelchair-bound due to deconditioning/disequilibrium.  Skin:  Negative for color change, pallor, rash and wound.  Neurological:  Negative for seizures and headaches.  Psychiatric/Behavioral:  Negative for confusion and suicidal ideas.     Objective:       04/12/2023   10:50 AM 11/23/2022   10:48 AM 08/24/2022   10:42 AM  Vitals with BMI  Height  5\' 6"    Weight  158 lbs 2 oz   BMI  25.53   Systolic 106 128 --  Diastolic 68 81 --  Pulse 96 94     BP 106/68   Pulse 96   Wt Readings from Last 3 Encounters:  11/23/22 158 lb 1.9 oz (71.7 kg)  08/24/22 160 lb 1.3 oz (72.6 kg)  06/05/22 167 lb (75.8 kg)      CMP ( most recent) CMP     Component Value Date/Time   NA 141 11/23/2022 1106   K 4.5 11/23/2022 1106   CL 98 11/23/2022 1106   CO2 27 11/23/2022 1106   GLUCOSE 135 (H) 11/23/2022 1106   GLUCOSE 228 (H) 07/21/2021 0329   BUN 17 11/23/2022 1106   CREATININE 1.07 (H) 11/23/2022 1106   CREATININE 0.95 07/21/2021 0329   CALCIUM 9.4 11/23/2022 1106   PROT 6.7  11/23/2022 1106   ALBUMIN 4.2 11/23/2022 1106   AST 19 11/23/2022 1106   ALT 18 11/23/2022 1106   ALKPHOS 129 (H) 11/23/2022 1106   BILITOT 0.3 11/23/2022  1106   GFRNONAA 34 (L) 02/07/2021 1745   GFRNONAA 65 03/26/2019 1517   GFRAA 68 12/02/2019 1203   GFRAA 75 03/26/2019 1517    Diabetic Labs (most recent): Lab Results  Component Value Date   HGBA1C 6.7 04/12/2023   HGBA1C 6.8 (H) 11/23/2022   HGBA1C 6.8 (H) 08/24/2022    Lipid Panel     Component Value Date/Time   CHOL 133 11/23/2022 1106   TRIG 115 11/23/2022 1106   HDL 43 11/23/2022 1106   CHOLHDL 3.1 11/23/2022 1106   CHOLHDL 4.3 03/26/2019 1517   LDLCALC 69 11/23/2022 1106   LDLCALC 122 (H) 03/26/2019 1517   LABVLDL 21 11/23/2022 1106      Latest Reference Range & Units 05/25/22 10:00 05/25/22 10:20  TSH 0.450 - 4.500 uIU/mL 4.130 4.170  T4,Free(Direct) 0.82 - 1.77 ng/dL 1.61 0.96   Assessment & Plan:   1. Uncontrolled type 2 diabetes mellitus with hyperglycemia (HCC)  - MADALYN LEGNER has currently uncontrolled symptomatic type 2 DM since  68 years of age.   She presents her log which shows significantly improved glycemic profile to near target levels.    Ayrabella presents with continued improvement in her glycemic profile.  Her point-of-care A1c is 6.7% generally improving from 11.5%.  She did not document any hypoglycemia lately.      Recent labs reviewed. - I had a long discussion with her about the progressive nature of diabetes and the pathology behind its complications. -her diabetes is complicated by CKD, sedentary life, smoking and she remains at a high risk for more acute and chronic complications which include CAD, CVA, retinopathy, and neuropathy. These are all discussed in detail with her.  - I have counseled her on diet  and weight management  by adopting a carbohydrate restricted/protein rich diet. Patient is encouraged to switch to  unprocessed or minimally processed     complex starch and  increased protein intake (animal or plant source), fruits, and vegetables. -  she is advised to stick to a routine mealtimes to eat 3 meals  a day and avoid unnecessary snacks ( to snack only to correct hypoglycemia).   - she acknowledges that there is a room for improvement in her food and drink choices. - Suggestion is made for her to avoid simple carbohydrates  from her diet including Cakes, Sweet Desserts, Ice Cream, Soda (diet and regular), Sweet Tea, Candies, Chips, Cookies, Store Bought Juices, Alcohol , Artificial Sweeteners,  Coffee Creamer, and "Sugar-free" Products, Lemonade. This will help patient to have more stable blood glucose profile and potentially avoid unintended weight gain.   - I have approached her with the following individualized plan to manage  her diabetes and patient agrees:   -Based on her presentation with near target glycemic profile, she will not need insulin treatment for now.  She is advised to continue metformin 1000 mg p.o. twice daily, Rybelsus 7 mg p.o. daily at breakfast.  She is also on Farxiga 10 mg p.o. daily at breakfast.  Side effects and precautions discussed with her. -  she is encouraged to call clinic for blood glucose levels less than 70 or greater than 200 mg/Dl at fasting.  - Specific targets for  A1c;  LDL, HDL,  and Triglycerides were discussed with the patient.  2) Blood Pressure /Hypertension:  Her blood pressure is controlled to target. she is advised to continue her current medications including losartan/HCTZ 100/25 mg p.o. daily with breakfast . The patient was counseled  on the dangers of tobacco use, and was advised to quit.  Reviewed strategies to maximize success, including removing cigarettes and smoking materials from environment.   3) Lipids/Hyperlipidemia:   Review of her recent lipid panel showed improved LDL to 69, overall improving from 122.   She is on atorvastatin 40 mg p.o. nightly.  She is advised to continue.  4)   Weight/Diet:  There is no height or weight on file to calculate BMI.  -She is a candidate for some  weight loss.   I discussed with her the fact that loss of 5 - 10% of her  current body weight will have the most impact on her diabetes management.  Exercise, and detailed carbohydrates information provided  -  detailed on discharge instructions.  5) Chronic Care/Health Maintenance:  -she  is on ACEI/ARB and Statin medications and  is encouraged to initiate and continue to follow up with Ophthalmology, Dentist,  Podiatrist at least yearly or according to recommendations, and advised to  Quit smoking. I have recommended yearly flu vaccine and pneumonia vaccine at least every 5 years;  and  sleep for at least 7 hours a day.  She is wheelchair-bound, cannot exercise optimally.   6) subclinical hypothyroidism  -She did have high antithyroid antibodies.  However, she continues to have euthyroid state based on her current thyroid function test., She will not need intervention with thyroid hormone at this time.  - she is  advised to maintain close follow up with Gilmore Laroche, FNP for primary care needs, as well as her other providers for optimal and coordinated care.   I spent  26  minutes in the care of the patient today including review of labs from CMP, Lipids, Thyroid Function, Hematology (current and previous including abstractions from other facilities); face-to-face time discussing  her blood glucose readings/logs, discussing hypoglycemia and hyperglycemia episodes and symptoms, medications doses, her options of short and long term treatment based on the latest standards of care / guidelines;  discussion about incorporating lifestyle medicine;  and documenting the encounter. Risk reduction counseling performed per USPSTF guidelines to reduce cardiovascular risk factors.     Please refer to Patient Instructions for Blood Glucose Monitoring and Insulin/Medications Dosing Guide"  in media tab for  additional information. Please  also refer to " Patient Self Inventory" in the Media  tab for reviewed elements of pertinent patient history.  [name] participated in the discussions, expressed understanding, and voiced agreement with the above plans.  All questions were answered to her satisfaction. she is encouraged to contact clinic should she have any questions or concerns prior to her return visit.     Follow up plan: - Return in about 4 months (around 08/10/2023) for F/U with Pre-visit Labs, Meter/CGM/Logs, A1c here.  Marquis Lunch, MD [facility] [address] [place], [place] [address] Phone: [phone]  Fax: [phone]    04/12/2023, 12:35 PM  This note was partially dictated with voice recognition software. Similar sounding words can be transcribed inadequately or may not  be corrected upon review.

## 2023-04-12 NOTE — Patient Instructions (Signed)

## 2023-04-19 ENCOUNTER — Ambulatory Visit (INDEPENDENT_AMBULATORY_CARE_PROVIDER_SITE_OTHER): Payer: Medicare Other | Admitting: Family Medicine

## 2023-04-19 ENCOUNTER — Encounter: Payer: Self-pay | Admitting: Family Medicine

## 2023-04-19 VITALS — BP 120/71 | HR 75 | Temp 97.9°F | Ht 66.0 in | Wt 159.8 lb

## 2023-04-19 DIAGNOSIS — Z7984 Long term (current) use of oral hypoglycemic drugs: Secondary | ICD-10-CM

## 2023-04-19 DIAGNOSIS — E1165 Type 2 diabetes mellitus with hyperglycemia: Secondary | ICD-10-CM | POA: Diagnosis not present

## 2023-04-19 DIAGNOSIS — I10 Essential (primary) hypertension: Secondary | ICD-10-CM | POA: Diagnosis not present

## 2023-04-19 DIAGNOSIS — R7301 Impaired fasting glucose: Secondary | ICD-10-CM

## 2023-04-19 DIAGNOSIS — E1169 Type 2 diabetes mellitus with other specified complication: Secondary | ICD-10-CM

## 2023-04-19 DIAGNOSIS — E7849 Other hyperlipidemia: Secondary | ICD-10-CM

## 2023-04-19 DIAGNOSIS — E559 Vitamin D deficiency, unspecified: Secondary | ICD-10-CM | POA: Diagnosis not present

## 2023-04-19 DIAGNOSIS — E785 Hyperlipidemia, unspecified: Secondary | ICD-10-CM

## 2023-04-19 DIAGNOSIS — K219 Gastro-esophageal reflux disease without esophagitis: Secondary | ICD-10-CM | POA: Diagnosis not present

## 2023-04-19 DIAGNOSIS — E038 Other specified hypothyroidism: Secondary | ICD-10-CM | POA: Diagnosis not present

## 2023-04-19 MED ORDER — OMEPRAZOLE 20 MG PO CPDR: 20.0000 mg | DELAYED_RELEASE_CAPSULE | Freq: Every day | ORAL | 3 refills | Status: DC | PRN

## 2023-04-19 MED ORDER — RYBELSUS 7 MG PO TABS: 7.0000 mg | ORAL_TABLET | Freq: Every day | ORAL | 1 refills | Status: DC

## 2023-04-19 NOTE — Progress Notes (Signed)
 Established Patient Office Visit  Subjective:  Patient ID: Kim [name], female    DOB: 01-08-1956  Age: 68 y.o. MRN: [id number]  CC:  Chief Complaint  Patient presents with   Follow-up    4 month follow up     HPI Kim [name] is a 68 y.o. female with past medical history of hypertension, hyperlipidemia, and type 2 diabetes presents for f/u of  chronic medical conditions. For the details of today's visit, please refer to the assessment and plan.     Past Medical History:  Diagnosis Date   Cancer [facility])    history of leukemia   Community acquired pneumonia 05/26/2013   Diabetes mellitus without complication (HCC)    Encounter for screening mammogram for malignant neoplasm of breast 03/26/2019   Hypercholesteremia    Hypertension    Neuropathy    Seasonal allergies     Past Surgical History:  Procedure Laterality Date   COLONOSCOPY  12/28/2011   Procedure: COLONOSCOPY;  Surgeon: West Bali, MD;  Location: [facility];  Service: Endoscopy;  Laterality: N/A;  9:30 AM-changed to 8:30 [name] to notify pt   FOOT SURGERY Left    LAPAROSCOPY     TUBAL LIGATION     TUNNELED VENOUS CATHETER PLACEMENT      Family History  Problem Relation Age of Onset   Hypothyroidism Mother    Diabetes Mother    Hypertension Mother    Thyroid disease Mother    Hypertension Father    Stroke Father    Colon cancer Neg Hx     Social History   Socioeconomic History   Marital status: Divorced    Spouse name: Not on file   Number of children: 2   Years of education: Not on file   Highest education level: 11th grade  Occupational History   Not on file  Tobacco Use   Smoking status: Every Day    Current packs/day: 0.30    Average packs/day: 0.3 packs/day for 40.0 years (12.0 ttl pk-yrs)    Types: Cigarettes   Smokeless tobacco: Never   Tobacco comments:    Working on quitting  Substance and Sexual Activity   Alcohol use: No   Drug use: No   Sexual activity: Not Currently   Other Topics Concern   Not on file  Social History Narrative   Lives with 64 year old mother -caregiver    2 grown children   Son Perley Jain in [place]    Daughter Victorino Dike in [place] with daughter Maralyn Sago       Enjoys: shopping, play games       Diet: not as good a she would like, eats all food groups   Caffeine: 2 cups of coffee in the mornings, coke daily   Water: 3-4 cups daily       Wear seat belt    Smoke detectors at home   Does not use phone while driving      Social Drivers of Corporate investment banker Strain: Low Risk  (06/05/2021)   Overall Financial Resource Strain (CARDIA)    Difficulty of Paying Living Expenses: Not hard at all  Food Insecurity: No Food Insecurity (06/05/2021)   Hunger Vital Sign    Worried About Running Out of Food in the Last Year: Never true    Ran Out of Food in the Last Year: Never true  Transportation Needs: No Transportation Needs (06/05/2021)   PRAPARE - Transportation  Lack of Transportation (Medical): No    Lack of Transportation (Non-Medical): No  Physical Activity: Inactive (06/05/2021)   Exercise Vital Sign    Days of Exercise per Week: 0 days    Minutes of Exercise per Session: 0 min  Stress: No Stress Concern Present (06/05/2021)   [employer] of Occupational Health - Occupational Stress Questionnaire    Feeling of Stress : Not at all  Social Connections: Socially Isolated (06/05/2021)   Social Connection and Isolation Panel [NHANES]    Frequency of Communication with Friends and Family: Three times a week    Frequency of Social Gatherings with Friends and Family: Three times a week    Attends Religious Services: Never    Active Member of Clubs or Organizations: No    Attends Banker Meetings: Never    Marital Status: Divorced  Catering manager Violence: Not At Risk (06/01/2020)   Humiliation, Afraid, Rape, and Kick questionnaire    Fear of Current or Ex-Partner: No    Emotionally Abused:  No    Physically Abused: No    Sexually Abused: No    Outpatient Medications Prior to Visit  Medication Sig Dispense Refill   Accu-Chek Softclix Lancets lancets Use to test BG twice daily 200 each 2   acetaminophen (TYLENOL) 500 MG tablet Take 500 mg by mouth every 6 (six) hours as needed. Pain.     atorvastatin (LIPITOR) 40 MG tablet Take 1 tablet (40 mg total) by mouth daily. 90 tablet 3   Blood Glucose Monitoring Suppl (ACCU-CHEK GUIDE) w/Device KIT Use to check BG twice daily 1 kit 0   Cholecalciferol 50 MCG (2000 UT) CAPS Take 1 capsule (2,000 Units total) by mouth daily with breakfast. 90 capsule 1   collagenase (SANTYL) ointment Apply 1 application topically daily. 30 g 2   FARXIGA 10 MG TABS tablet TAKE 1 TABLET(10 MG) BY MOUTH DAILY BEFORE BREAKFAST 30 tablet 2   glucose blood (GE100 BLOOD GLUCOSE TEST) test strip Use as instructed 200 strip 1   losartan-hydrochlorothiazide (HYZAAR) 100-25 MG tablet TAKE ONE TABLET BY MOUTH ONCE DAILY 90 tablet 1   metFORMIN (GLUCOPHAGE) 1000 MG tablet TAKE 1 TABLET BY MOUTH TWICE DAILY WITH MEALS 180 tablet 0   silver sulfADIAZINE (SILVADENE) 1 % cream Apply 1 application  topically 2 (two) times daily as needed.     omeprazole (PRILOSEC) 20 MG capsule Take 1 capsule (20 mg total) by mouth daily as needed. Heartburn 30 capsule 3   Semaglutide (RYBELSUS) 7 MG TABS TAKE 1 TABLET(7 MG) BY MOUTH DAILY 90 tablet 0   No facility-administered medications prior to visit.    Allergies  Allergen Reactions   Sulfamethoxazole-Trimethoprim Itching and Rash    Rash all over body, itching    ROS Review of Systems    Objective:    Physical Exam  BP 120/71   Pulse 75   Temp 97.9 F (36.6 C)   Ht 5\' 6"  (1.676 m)   Wt 159 lb 12.8 oz (72.5 kg)   SpO2 94%   BMI 25.79 kg/m  Wt Readings from Last 3 Encounters:  04/19/23 159 lb 12.8 oz (72.5 kg)  11/23/22 158 lb 1.9 oz (71.7 kg)  08/24/22 160 lb 1.3 oz (72.6 kg)    Lab Results  Component Value  Date   TSH WILL FOLLOW 04/19/2023   Lab Results  Component Value Date   WBC 7.6 04/19/2023   HGB 15.8 04/19/2023   HCT 46.6 04/19/2023   MCV  91 04/19/2023   PLT 298 04/19/2023   Lab Results  Component Value Date   NA WILL FOLLOW 04/19/2023   K WILL FOLLOW 04/19/2023   CO2 WILL FOLLOW 04/19/2023   GLUCOSE WILL FOLLOW 04/19/2023   BUN WILL FOLLOW 04/19/2023   CREATININE WILL FOLLOW 04/19/2023   BILITOT WILL FOLLOW 04/19/2023   ALKPHOS WILL FOLLOW 04/19/2023   AST WILL FOLLOW 04/19/2023   ALT WILL FOLLOW 04/19/2023   PROT WILL FOLLOW 04/19/2023   ALBUMIN WILL FOLLOW 04/19/2023   CALCIUM WILL FOLLOW 04/19/2023   ANIONGAP 12 02/07/2021   EGFR WILL FOLLOW 04/19/2023   Lab Results  Component Value Date   CHOL WILL FOLLOW 04/19/2023   Lab Results  Component Value Date   HDL WILL FOLLOW 04/19/2023   Lab Results  Component Value Date   LDLCALC WILL FOLLOW 04/19/2023   Lab Results  Component Value Date   TRIG WILL FOLLOW 04/19/2023   Lab Results  Component Value Date   CHOLHDL WILL FOLLOW 04/19/2023   Lab Results  Component Value Date   HGBA1C 6.8 (H) 04/19/2023      Assessment & Plan:  Essential hypertension, benign Assessment & Plan: Controlled She takes losarta-hydrochlorothiazide 125 mg daily Denies headaches, dizziness, blurred vision, chest pain, palpitation Low-sodium diet with increased physical activity encouraged BP Readings from Last 3 Encounters:  04/19/23 120/71  04/12/23 106/68  11/23/22 128/81      Hyperlipidemia associated with type 2 diabetes mellitus (HCC) Assessment & Plan: She takes atorvastatin 40 mg daily Denies muscle aches and pains Encouraged decreasing her intake of greasy, fatty, starchy foods with increased physical activity Lab Results  Component Value Date   CHOL WILL FOLLOW 04/19/2023   HDL WILL FOLLOW 04/19/2023   LDLCALC WILL FOLLOW 04/19/2023   TRIG WILL FOLLOW 04/19/2023   CHOLHDL WILL FOLLOW 04/19/2023      Orders: -     Lipid panel -     CMP14+EGFR -     CBC with Differential/Platelet  Gastroesophageal reflux disease without esophagitis Assessment & Plan: She takes omeprazole 20 mg daily GERD diet encouraged   Orders: -     Omeprazole; Take 1 capsule (20 mg total) by mouth daily as needed. Heartburn  Dispense: 30 capsule; Refill: 3  Uncontrolled type 2 diabetes mellitus with hyperglycemia (HCC) -     Rybelsus; Take 1 tablet (7 mg total) by mouth daily.  Dispense: 90 tablet; Refill: 1  IFG (impaired fasting glucose) -     Hemoglobin A1c  Vitamin D deficiency Assessment & Plan: Encouraged to increase his intake of vitamin D-rich foods such as fatty fish (e.g., salmon, mackerel, and sardines), fortified dairy products, egg yolks, and fortified cereals.   Orders: -     VITAMIN D 25 Hydroxy (Vit-D Deficiency, Fractures)  TSH (thyroid-stimulating hormone deficiency) -     TSH + free T4  Note: This chart has been completed using Engineer, civil (consulting) software, and while attempts have been made to ensure accuracy, certain words and phrases may not be transcribed as intended.    Follow-up: Return in about 4 months (around 08/19/2023).   Gilmore Laroche, FNP

## 2023-04-19 NOTE — Patient Instructions (Addendum)
 I appreciate the opportunity to provide care to you today!    Follow up:  4 months  Labs: please stop by the lab today/ during the week to get your blood drawn (CBC, CMP, TSH, Lipid profile, HgA1c, Vit D)  Schedule annual wellness visit   Please continue to a heart-healthy diet and increase your physical activities. Try to exercise for at least five days a week.    It was a pleasure to see you and I look forward to continuing to work together on your health and well-being. Please do not hesitate to call the office if you need care or have questions about your care.  In case of emergency, please visit the Emergency Department for urgent care, or contact our clinic at [phone] to schedule an appointment. We're here to help you!   Have a wonderful day and week. With Gratitude, Gilmore Laroche MSN, FNP-BC

## 2023-04-20 LAB — LIPID PANEL

## 2023-04-21 NOTE — Assessment & Plan Note (Signed)
 Controlled She takes losarta-hydrochlorothiazide 125 mg daily Denies headaches, dizziness, blurred vision, chest pain, palpitation Low-sodium diet with increased physical activity encouraged BP Readings from Last 3 Encounters:  04/19/23 120/71  04/12/23 106/68  11/23/22 128/81

## 2023-04-21 NOTE — Assessment & Plan Note (Signed)
 She takes atorvastatin 40 mg daily Denies muscle aches and pains Encouraged decreasing her intake of greasy, fatty, starchy foods with increased physical activity Lab Results  Component Value Date   CHOL WILL FOLLOW 04/19/2023   HDL WILL FOLLOW 04/19/2023   LDLCALC WILL FOLLOW 04/19/2023   TRIG WILL FOLLOW 04/19/2023   CHOLHDL WILL FOLLOW 04/19/2023

## 2023-04-21 NOTE — Assessment & Plan Note (Signed)
 Encouraged to increase his intake of vitamin D-rich foods such as fatty fish (e.g., salmon, mackerel, and sardines), fortified dairy products, egg yolks, and fortified cereals.

## 2023-04-21 NOTE — Assessment & Plan Note (Signed)
 She takes omeprazole 20 mg daily GERD diet encouraged

## 2023-04-23 LAB — CMP14+EGFR
ALT: 29 IU/L (ref 0–32)
AST: 27 IU/L (ref 0–40)
Albumin: 4.3 g/dL (ref 3.9–4.9)
Alkaline Phosphatase: 134 IU/L — ABNORMAL HIGH (ref 44–121)
BUN/Creatinine Ratio: 23 (ref 12–28)
BUN: 23 mg/dL (ref 8–27)
Bilirubin Total: 0.3 mg/dL (ref 0.0–1.2)
CO2: 23 mmol/L (ref 20–29)
Calcium: 9.8 mg/dL (ref 8.7–10.3)
Chloride: 97 mmol/L (ref 96–106)
Creatinine, Ser: 0.99 mg/dL (ref 0.57–1.00)
Globulin, Total: 2.5 g/dL (ref 1.5–4.5)
Glucose: 129 mg/dL — ABNORMAL HIGH (ref 70–99)
Potassium: 5.1 mmol/L (ref 3.5–5.2)
Sodium: 140 mmol/L (ref 134–144)
Total Protein: 6.8 g/dL (ref 6.0–8.5)
eGFR: 62 mL/min/{1.73_m2} (ref >59)

## 2023-04-23 LAB — HEMOGLOBIN A1C
Est. average glucose Bld gHb Est-mCnc: 148 mg/dL
Hgb A1c MFr Bld: 6.8 % — ABNORMAL HIGH (ref 4.8–5.6)

## 2023-04-23 LAB — CBC WITH DIFFERENTIAL/PLATELET
Basophils Absolute: 0.1 10*3/uL (ref 0.0–0.2)
Basos: 1 %
EOS (ABSOLUTE): 0.2 10*3/uL (ref 0.0–0.4)
Eos: 3 %
Hematocrit: 46.6 % (ref 34.0–46.6)
Hemoglobin: 15.8 g/dL (ref 11.1–15.9)
Immature Grans (Abs): 0 10*3/uL (ref 0.0–0.1)
Immature Granulocytes: 0 %
Lymphocytes Absolute: 2.3 10*3/uL (ref 0.7–3.1)
Lymphs: 31 %
MCH: 30.7 pg (ref 26.6–33.0)
MCHC: 33.9 g/dL (ref 31.5–35.7)
MCV: 91 fL (ref 79–97)
Monocytes Absolute: 0.4 10*3/uL (ref 0.1–0.9)
Monocytes: 5 %
Neutrophils Absolute: 4.7 10*3/uL (ref 1.4–7.0)
Neutrophils: 60 %
Platelets: 298 10*3/uL (ref 150–450)
RBC: 5.14 x10E6/uL (ref 3.77–5.28)
RDW: 13.1 % (ref 11.7–15.4)
WBC: 7.6 10*3/uL (ref 3.4–10.8)

## 2023-04-23 LAB — VITAMIN D 25 HYDROXY (VIT D DEFICIENCY, FRACTURES): Vit D, 25-Hydroxy: 75.3 ng/mL (ref 30.0–100.0)

## 2023-04-23 LAB — TSH+FREE T4
Free T4: 1.36 ng/dL (ref 0.82–1.77)
TSH: 3.12 u[IU]/mL (ref 0.450–4.500)

## 2023-04-23 LAB — LIPID PANEL
Cholesterol, Total: 129 mg/dL (ref 100–199)
HDL: 48 mg/dL (ref >39)
LDL CALC COMMENT:: 2.7 ratio (ref 0.0–4.4)
LDL Chol Calc (NIH): 62 mg/dL (ref 0–99)
Triglycerides: 103 mg/dL (ref 0–149)
VLDL Cholesterol Cal: 19 mg/dL (ref 5–40)

## 2023-04-25 ENCOUNTER — Telehealth: Payer: Self-pay

## 2023-04-25 NOTE — Telephone Encounter (Signed)
 Copied from CRM [id number]. Topic: General - Other >> Apr 25, 2023  2:02 PM Geroge Baseman wrote: Reason for CRM: Semaglutide (RYBELSUS) 7 MG TABS is not on  patients insurances approved medications list,  please call ASAP with an approved alternative. [phone] [place].

## 2023-04-26 ENCOUNTER — Other Ambulatory Visit: Payer: Self-pay | Admitting: "Endocrinology

## 2023-04-27 ENCOUNTER — Encounter: Payer: Self-pay | Admitting: Family Medicine

## 2023-04-27 ENCOUNTER — Other Ambulatory Visit: Payer: Self-pay | Admitting: Family Medicine

## 2023-04-27 DIAGNOSIS — E1165 Type 2 diabetes mellitus with hyperglycemia: Secondary | ICD-10-CM

## 2023-04-27 MED ORDER — GLIMEPIRIDE 2 MG PO TABS: 2.0000 mg | ORAL_TABLET | Freq: Every day | ORAL | 3 refills | Status: DC

## 2023-04-27 NOTE — Telephone Encounter (Signed)
 Please encourage the patient to start taking Glimepiride 2 mg daily. The prescription has been sent to the pharmacy.

## 2023-04-29 NOTE — Telephone Encounter (Signed)
 First attempt. To inform pt. No answer. Lvm. For call back

## 2023-05-07 DIAGNOSIS — L97312 Non-pressure chronic ulcer of right ankle with fat layer exposed: Secondary | ICD-10-CM | POA: Diagnosis not present

## 2023-05-21 ENCOUNTER — Other Ambulatory Visit (HOSPITAL_COMMUNITY): Payer: Self-pay

## 2023-05-23 ENCOUNTER — Telehealth: Payer: Self-pay | Admitting: Family Medicine

## 2023-05-23 ENCOUNTER — Other Ambulatory Visit (HOSPITAL_COMMUNITY): Payer: Self-pay

## 2023-05-23 ENCOUNTER — Telehealth: Payer: Self-pay | Admitting: Pharmacy Technician

## 2023-05-23 NOTE — Telephone Encounter (Signed)
 Copied from CRM [id number]. Topic: Clinical - Prescription Issue >> May 23, 2023 12:28 PM Turkey B wrote: Reason for CRM: jpt called in, states insurance won't cover med, Semaglutide (RYBELSUS) 7 MG TABS

## 2023-05-23 NOTE — Telephone Encounter (Signed)
 Can a PA be done on the rybelsus? thanks

## 2023-05-23 NOTE — Telephone Encounter (Signed)
 Pharmacy Patient Advocate Encounter   Received notification from Pt Calls Messages that prior authorization for RYBELSUS 7MG  TABLETS is required/requested.   Insurance verification completed.   The patient is insured through [employer] .   Per test claim:  OZEMPIC AND Greggory Keen is preferred by the insurance.  If suggested medication is appropriate, Please send in a new RX and discontinue this one. If not, please advise as to why it's not appropriate so that we may request a Prior Authorization. Please note, some preferred medications may still require a PA.  If the suggested medications have not been trialed and there are no contraindications to their use, the PA will not be submitted, as it will not be approved.

## 2023-05-23 NOTE — Telephone Encounter (Signed)
 noted

## 2023-05-23 NOTE — Telephone Encounter (Signed)
 PA request has been Received. New Encounter has been or will be created for follow up. For additional info see Pharmacy Prior Auth telephone encounter from 05/23/2023.

## 2023-05-24 NOTE — Telephone Encounter (Signed)
 Ozempic or mounjaro is preferred. Must try one of these. Or or have a contraindication to these to proceed with the Rybelsus PA

## 2023-05-25 ENCOUNTER — Other Ambulatory Visit: Payer: Self-pay | Admitting: Family Medicine

## 2023-05-25 DIAGNOSIS — I10 Essential (primary) hypertension: Secondary | ICD-10-CM

## 2023-05-30 ENCOUNTER — Other Ambulatory Visit: Payer: Self-pay | Admitting: Family Medicine

## 2023-05-31 ENCOUNTER — Other Ambulatory Visit: Payer: Self-pay | Admitting: Family Medicine

## 2023-05-31 DIAGNOSIS — E1165 Type 2 diabetes mellitus with hyperglycemia: Secondary | ICD-10-CM

## 2023-06-04 ENCOUNTER — Other Ambulatory Visit: Payer: Self-pay | Admitting: Family Medicine

## 2023-06-04 DIAGNOSIS — E1165 Type 2 diabetes mellitus with hyperglycemia: Secondary | ICD-10-CM

## 2023-06-04 DIAGNOSIS — L97321 Non-pressure chronic ulcer of left ankle limited to breakdown of skin: Secondary | ICD-10-CM | POA: Diagnosis not present

## 2023-06-04 NOTE — Telephone Encounter (Signed)
 Update on possible therapy change to Ozempic or Mounjaro?

## 2023-06-06 ENCOUNTER — Telehealth: Payer: Self-pay

## 2023-06-06 NOTE — Telephone Encounter (Signed)
 Copied from CRM [id number]. Topic: Clinical - Medication Question >> Jun 06, 2023  9:46 AM Allyne Areola wrote: Reason for CRM: Patient is calling to advise that she would like to start Ozempic after taking her time to research. Please advise once the prescription is sent to the pharmacy.

## 2023-06-08 IMAGING — MR MR ANKLE*L* W/O CM
5 series · 32 of 40 positions shown · non-contrast
Comparison: None.

CLINICAL DATA: Left lateral ankle ulcer for 2 months.

EXAM:
MRI OF THE LEFT ANKLE WITHOUT CONTRAST
TECHNIQUE: Multiplanar, multisequence MR imaging of the ankle was performed. No
intravenous contrast was administered.

[Series 3: T2 fat-sat · axial · left · 3.0mm · 0.43mm/px · z∈[-78,+57]mm · 8 of 40 slices shown (1 of 2)]
[im 1/40]
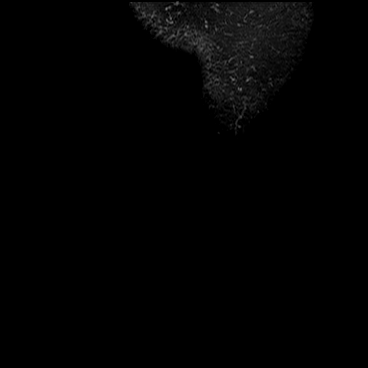
[im 5/40]
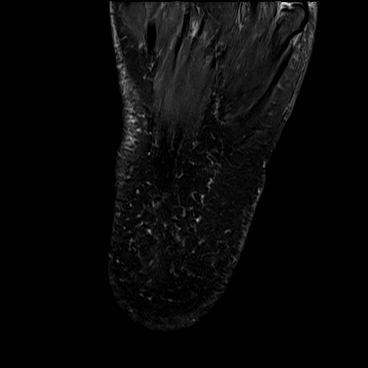
[im 14/40]
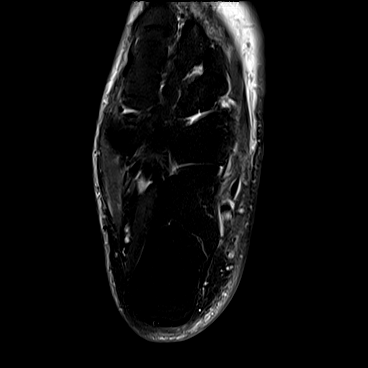
[im 18/40]
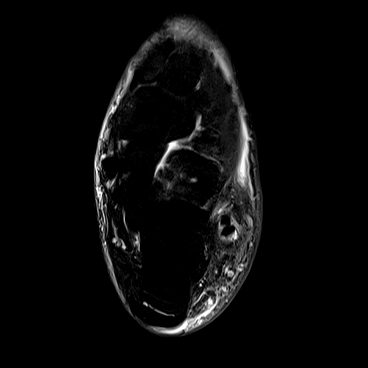
[im 22/40]
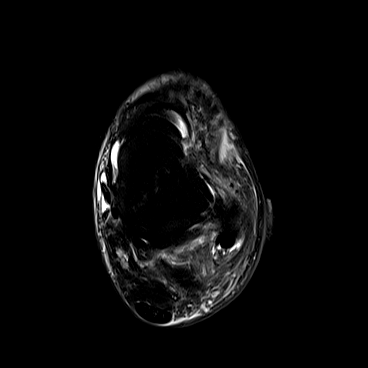
[im 27/40]
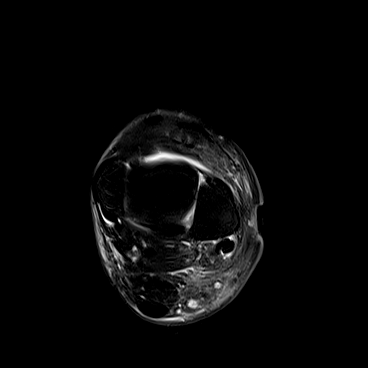
[im 35/40]
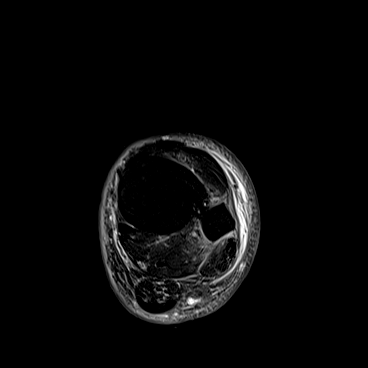
[im 40/40]
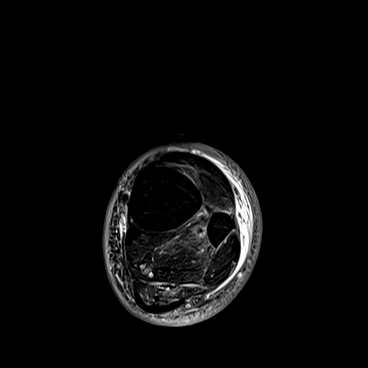

[Series 4: PD fat-sat · axial · left · 3.0mm · 0.40mm/px · z∈[-78,+57]mm · 8 of 40 slices shown]
[im 1/40]
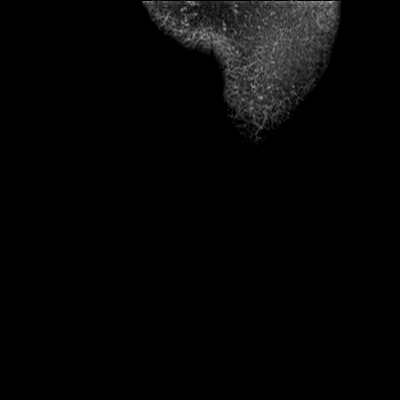
[im 5/40]
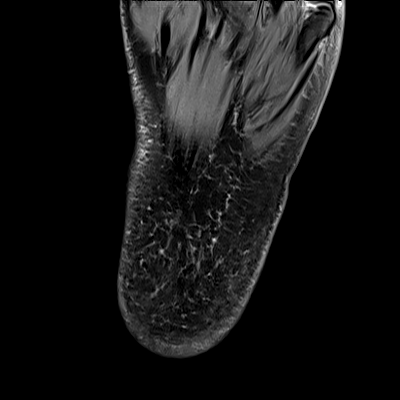
[im 14/40]
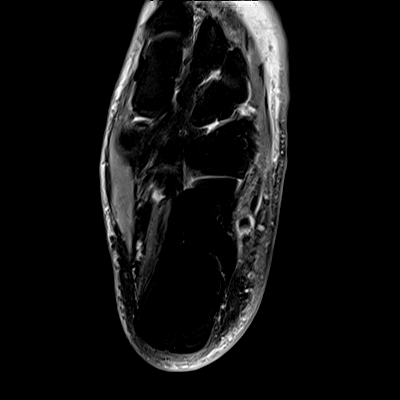
[im 18/40]
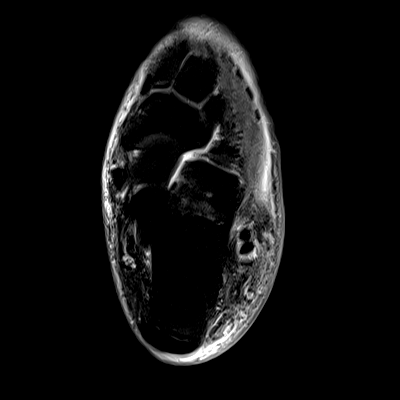
[im 22/40]
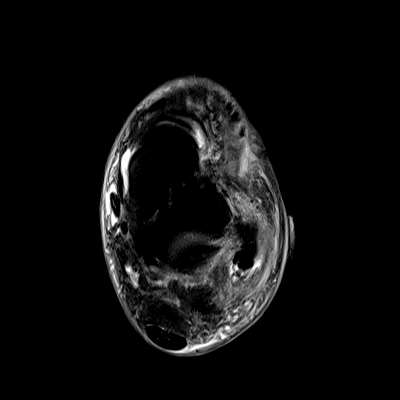
[im 27/40]
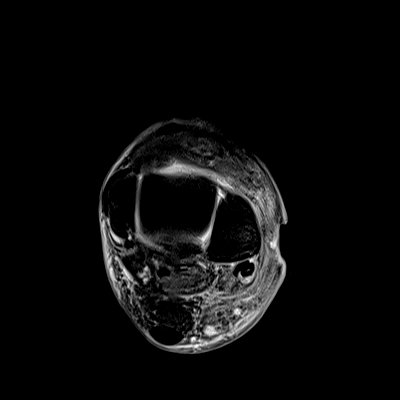
[im 35/40]
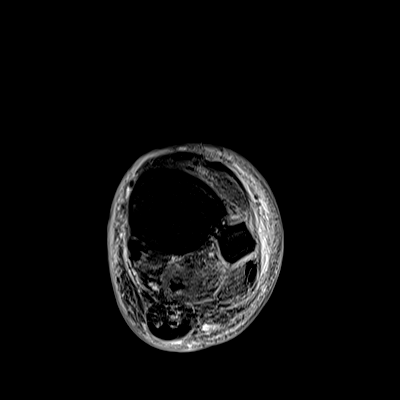
[im 40/40]
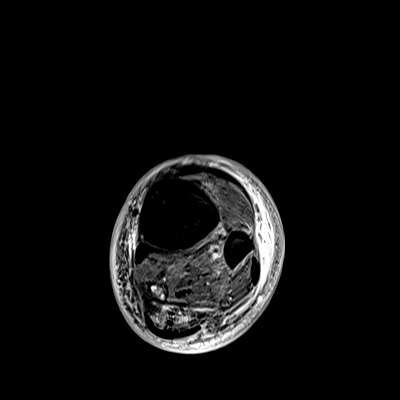

[Series 5: T2 fat-sat · coronal · left · 3.0mm · 0.45mm/px · 8 of 37 slices shown (2 of 2)]
[im 1/37]
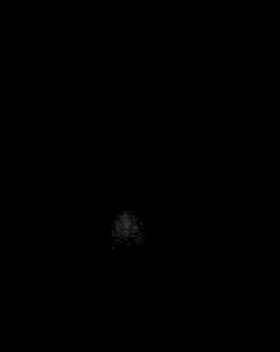
[im 5/37]
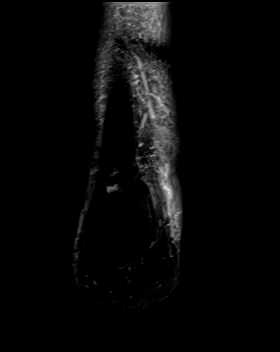
[im 13/37]
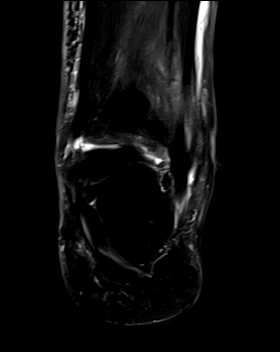
[im 17/37]
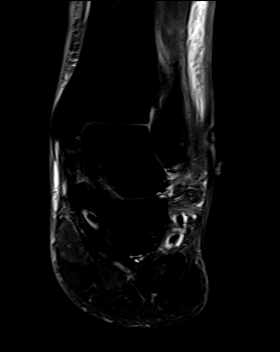
[im 21/37]
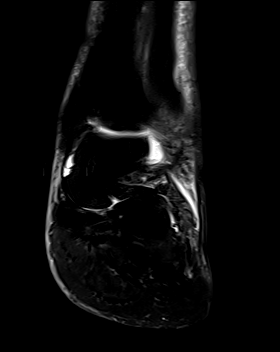
[im 25/37]
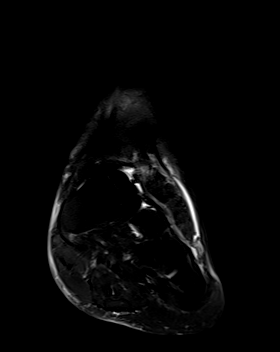
[im 33/37]
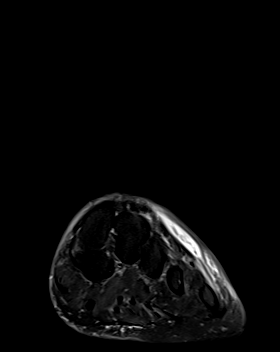
[im 37/37]
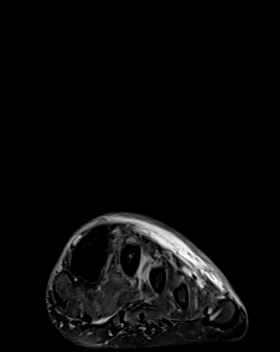

[Series 6: T1 · sagittal · left · 4.0mm · 0.42mm/px · 5 of 19 slices shown]
[im 1/19]
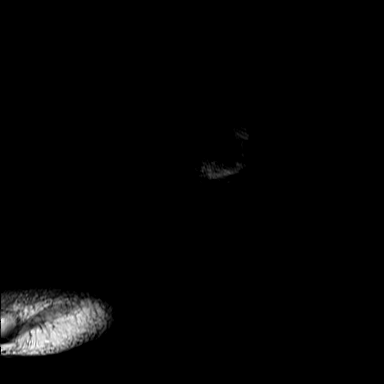
[im 5/19]
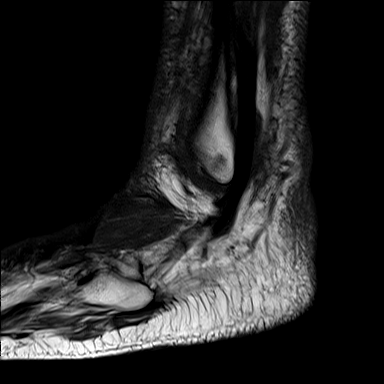
[im 10/19]
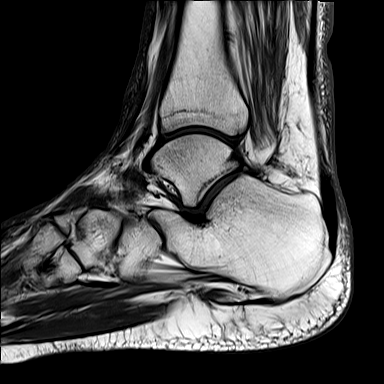
[im 14/19]
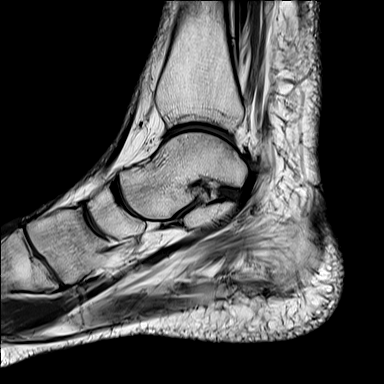
[im 19/19]
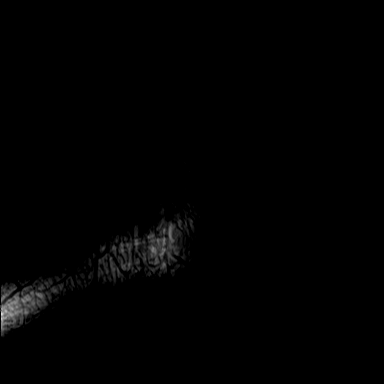

[Series 7: STIR · sagittal · left · 4.0mm · 0.31mm/px · 3 of 19 slices shown]
[im 1/19]
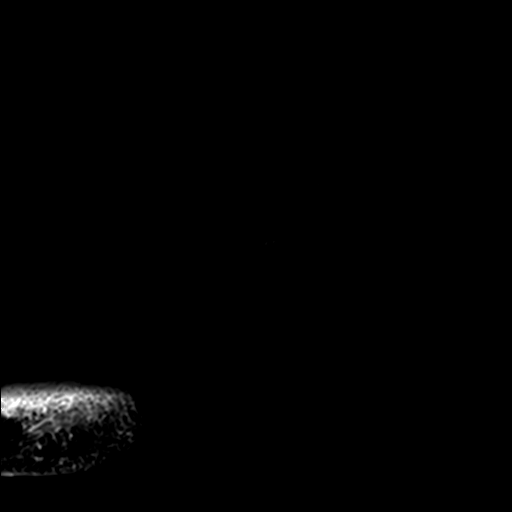
[im 5/19]
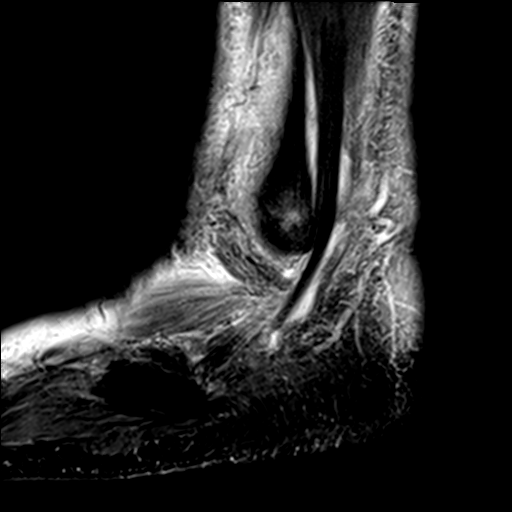
[im 10/19]
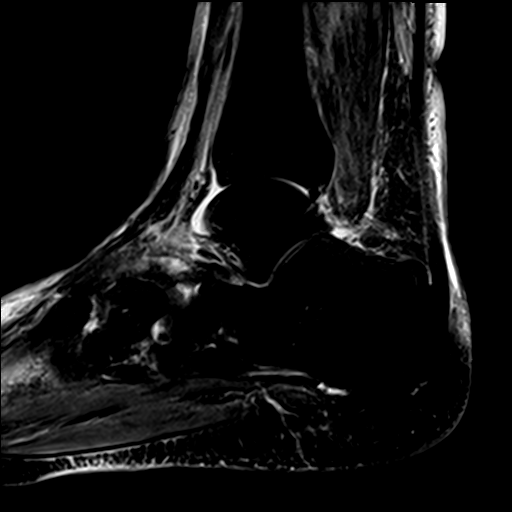

[32 of 40 positions shown; findings below may reference images not displayed]

FINDINGS: TENDONS

Peroneal: Peroneal longus tendon intact. Peroneal brevis intact.
Mild peroneal tenosynovitis.

Posteromedial: Mild tendinosis of the posterior tibial tendon with
mild tenosynovitis. Flexor hallucis longus tendon intact. Flexor
digitorum longus tendon intact.

Anterior: Tibialis anterior tendon intact. Extensor hallucis longus
tendon intact Extensor digitorum longus tendon intact.

Achilles:  Intact.

Plantar Fascia: Intact.

LIGAMENTS

Lateral: Anterior talofibular ligament intact. Calcaneofibular
ligament intact. Posterior talofibular ligament intact. Anterior and
posterior tibiofibular ligaments intact.

Medial: Deltoid ligament intact. Spring ligament intact.

CARTILAGE

Ankle Joint: Moderate ankle joint effusion. Normal ankle mortise. No
chondral defect.

Subtalar Joints/Sinus Tarsi: Normal subtalar joints. No subtalar
joint effusion. Normal sinus tarsi.

Bones: Soft tissue wound over the lateral malleolus. Subcortical
bone marrow edema in the lateral malleolus deep to the soft tissue
wound concerning for osteomyelitis.

Soft Tissue: No fluid collection or hematoma. Muscles are normal
without edema or atrophy. Tarsal tunnel is normal. Soft tissue edema
in the subcutaneous fat adjacent to the soft tissue ulcer concerning
for cellulitis.
IMPRESSION: 1. Soft tissue wound over the lateral malleolus with surrounding
cellulitis. Subcortical bone marrow edema in the lateral malleolus
deep to the soft tissue wound concerning for osteomyelitis.
2. Mild peroneal tenosynovitis.
3. Mild tendinosis of the posterior tibial tendon with mild
tenosynovitis.
4. Moderate nonspecific ankle joint effusion.

## 2023-06-11 ENCOUNTER — Other Ambulatory Visit: Payer: Self-pay | Admitting: Family Medicine

## 2023-06-11 DIAGNOSIS — E1165 Type 2 diabetes mellitus with hyperglycemia: Secondary | ICD-10-CM

## 2023-06-11 MED ORDER — OZEMPIC (0.25 OR 0.5 MG/DOSE) 2 MG/3ML ~~LOC~~ SOPN: 0.2500 mg | PEN_INJECTOR | SUBCUTANEOUS | 0 refills | Status: DC

## 2023-06-11 NOTE — Telephone Encounter (Signed)
 A prescription for Ozempic has been sent to the pharmacy. I recommend requesting refills monthly to allow for dose adjustments.

## 2023-06-11 NOTE — Telephone Encounter (Signed)
 Pt. Has been informed and understands when to call for refills.

## 2023-06-11 NOTE — Telephone Encounter (Signed)
 Can you provide update on alternate therapy for Rybelsus  7mg  tablets? Thank you.

## 2023-07-02 DIAGNOSIS — L97321 Non-pressure chronic ulcer of left ankle limited to breakdown of skin: Secondary | ICD-10-CM | POA: Diagnosis not present

## 2023-07-25 ENCOUNTER — Other Ambulatory Visit: Payer: Self-pay | Admitting: "Endocrinology

## 2023-07-26 ENCOUNTER — Other Ambulatory Visit: Payer: Self-pay | Admitting: Family Medicine

## 2023-07-26 DIAGNOSIS — E1165 Type 2 diabetes mellitus with hyperglycemia: Secondary | ICD-10-CM

## 2023-07-26 NOTE — Telephone Encounter (Unsigned)
 Copied from CRM [id number]. Topic: Clinical - Medication Refill >> Jul 26, 2023  9:25 AM Crispin Dolphin wrote: Medication: Semaglutide ,0.25 or 0.5MG /DOS, (OZEMPIC , 0.25 OR 0.5 MG/DOSE,) 2 MG/3ML SOPN - doing well on it needs refill   Has the patient contacted their pharmacy? No (Agent: If no, request that the patient contact the pharmacy for the refill. If patient does not wish to contact the pharmacy document the reason why and proceed with request.) (Agent: If yes, when and what did the pharmacy advise?)  This is the patient's preferred pharmacy:  [facility] [phone] - [place], [place] - [address] AT [facility] [address] [place] [address] Phone: [phone] Fax: [phone]  Is this the correct pharmacy for this prescription? Yes If no, delete pharmacy and type the correct one.   Has the prescription been filled recently? Yes  Is the patient out of the medication? Yes - took last this morning   Has the patient been seen for an appointment in the last year OR does the patient have an upcoming appointment? Yes  Can we respond through MyChart? No  Agent: Please be advised that Rx refills may take up to 3 business days. We ask that you follow-up with your pharmacy.

## 2023-07-29 ENCOUNTER — Other Ambulatory Visit: Payer: Self-pay | Admitting: Family Medicine

## 2023-07-29 DIAGNOSIS — E1165 Type 2 diabetes mellitus with hyperglycemia: Secondary | ICD-10-CM

## 2023-07-29 MED ORDER — OZEMPIC (0.25 OR 0.5 MG/DOSE) 2 MG/3ML ~~LOC~~ SOPN: 0.5000 mg | PEN_INJECTOR | SUBCUTANEOUS | 0 refills | Status: DC

## 2023-07-30 DIAGNOSIS — L97322 Non-pressure chronic ulcer of left ankle with fat layer exposed: Secondary | ICD-10-CM | POA: Diagnosis not present

## 2023-08-02 DIAGNOSIS — E1165 Type 2 diabetes mellitus with hyperglycemia: Secondary | ICD-10-CM | POA: Diagnosis not present

## 2023-08-03 ENCOUNTER — Other Ambulatory Visit: Payer: Self-pay | Admitting: "Endocrinology

## 2023-08-03 DIAGNOSIS — E1165 Type 2 diabetes mellitus with hyperglycemia: Secondary | ICD-10-CM

## 2023-08-03 LAB — COMPREHENSIVE METABOLIC PANEL WITH GFR
ALT: 20 IU/L (ref 0–32)
AST: 23 IU/L (ref 0–40)
Albumin: 4.1 g/dL (ref 3.9–4.9)
Alkaline Phosphatase: 141 IU/L — ABNORMAL HIGH (ref 44–121)
BUN/Creatinine Ratio: 20 (ref 12–28)
BUN: 25 mg/dL (ref 8–27)
Bilirubin Total: 0.4 mg/dL (ref 0.0–1.2)
CO2: 23 mmol/L (ref 20–29)
Calcium: 9.7 mg/dL (ref 8.7–10.3)
Chloride: 98 mmol/L (ref 96–106)
Creatinine, Ser: 1.22 mg/dL — ABNORMAL HIGH (ref 0.57–1.00)
Globulin, Total: 2.6 g/dL (ref 1.5–4.5)
Glucose: 93 mg/dL (ref 70–99)
Potassium: 4.7 mmol/L (ref 3.5–5.2)
Sodium: 139 mmol/L (ref 134–144)
Total Protein: 6.7 g/dL (ref 6.0–8.5)
eGFR: 48 mL/min/{1.73_m2} — ABNORMAL LOW (ref >59)

## 2023-08-03 LAB — LIPID PANEL
Chol/HDL Ratio: 2.8 ratio (ref 0.0–4.4)
Cholesterol, Total: 130 mg/dL (ref 100–199)
HDL: 46 mg/dL (ref >39)
LDL Chol Calc (NIH): 64 mg/dL (ref 0–99)
Triglycerides: 107 mg/dL (ref 0–149)
VLDL Cholesterol Cal: 20 mg/dL (ref 5–40)

## 2023-08-03 LAB — TSH: TSH: 4.6 u[IU]/mL — ABNORMAL HIGH (ref 0.450–4.500)

## 2023-08-03 LAB — T4, FREE: Free T4: 1.24 ng/dL (ref 0.82–1.77)

## 2023-08-12 ENCOUNTER — Ambulatory Visit (INDEPENDENT_AMBULATORY_CARE_PROVIDER_SITE_OTHER): Payer: Medicare Other | Admitting: "Endocrinology

## 2023-08-12 ENCOUNTER — Encounter: Payer: Self-pay | Admitting: "Endocrinology

## 2023-08-12 VITALS — BP 116/68 | HR 92

## 2023-08-12 DIAGNOSIS — E063 Autoimmune thyroiditis: Secondary | ICD-10-CM

## 2023-08-12 DIAGNOSIS — Z7984 Long term (current) use of oral hypoglycemic drugs: Secondary | ICD-10-CM

## 2023-08-12 DIAGNOSIS — E1165 Type 2 diabetes mellitus with hyperglycemia: Secondary | ICD-10-CM | POA: Diagnosis not present

## 2023-08-12 DIAGNOSIS — E782 Mixed hyperlipidemia: Secondary | ICD-10-CM

## 2023-08-12 DIAGNOSIS — I10 Essential (primary) hypertension: Secondary | ICD-10-CM | POA: Diagnosis not present

## 2023-08-12 LAB — POCT GLYCOSYLATED HEMOGLOBIN (HGB A1C): HbA1c, POC (controlled diabetic range): 6.1 % (ref 0.0–7.0)

## 2023-08-12 MED ORDER — OZEMPIC (0.25 OR 0.5 MG/DOSE) 2 MG/3ML ~~LOC~~ SOPN
0.2500 mg | PEN_INJECTOR | SUBCUTANEOUS | 1 refills | Status: DC
Start: 2023-08-12 — End: 2023-12-19

## 2023-08-12 NOTE — Progress Notes (Signed)
 08/12/2023, 12:52 PM   Endocrinology follow-up note  Subjective:    Patient ID: Kim [name], female    DOB: 11-Jul-1955.  Kim [name] is being seen in follow-up after Kim [name] was seen in consultation for management of currently uncontrolled symptomatic diabetes requested by  Zarwolo, Gloria, FNP.   Past Medical History:  Diagnosis Date   Cancer [facility])    history of leukemia   Community acquired pneumonia 05/26/2013   Diabetes mellitus without complication (HCC)    Encounter for screening mammogram for malignant neoplasm of breast 03/26/2019   Hypercholesteremia    Hypertension    Neuropathy    Seasonal allergies     Past Surgical History:  Procedure Laterality Date   COLONOSCOPY  12/28/2011   Procedure: COLONOSCOPY;  Surgeon: Margo LITTIE Haddock, MD;  Location: [facility];  Service: Endoscopy;  Laterality: N/A;  9:30 AM-changed to 8:30 [name] to notify pt   FOOT SURGERY Left    LAPAROSCOPY     TUBAL LIGATION     TUNNELED VENOUS CATHETER PLACEMENT      Social History   Socioeconomic History   Marital status: Divorced    Spouse name: Not on file   Number of children: 2   Years of education: Not on file   Highest education level: 11th grade  Occupational History   Not on file  Tobacco Use   Smoking status: Every Day    Current packs/day: 0.30    Average packs/day: 0.3 packs/day for 40.0 years (12.0 ttl pk-yrs)    Types: Cigarettes   Smokeless tobacco: Never   Tobacco comments:    Working on quitting  Substance and Sexual Activity   Alcohol use: No   Drug use: No   Sexual activity: Not Currently  Other Topics Concern   Not on file  Social History Narrative   Lives with 4 year old mother -caregiver    2 grown children   Son [name] in [place]    Daughter Delon in [place] with daughter Lauraine       Enjoys: shopping, play games       Diet: not as good a  Kim [name] would like, eats all food groups   Caffeine: 2 cups of coffee in the mornings, coke daily   Water : 3-4 cups daily       Wear seat belt    Smoke detectors at home   Does not use phone while driving      Social Drivers of Corporate investment banker Strain: Low Risk  (06/05/2021)   Overall Financial Resource Strain (CARDIA)    Difficulty of Paying Living Expenses: Not hard at all  Food Insecurity: No Food Insecurity (06/05/2021)   Hunger Vital Sign    Worried About Running Out of Food in the Last Year: Never true    Ran Out of Food in the Last Year: Never true  Transportation Needs: No Transportation Needs (06/05/2021)   PRAPARE - Administrator, Civil Service (Medical): No    Lack of Transportation (Non-Medical): No  Physical Activity: Inactive (06/05/2021)  Exercise Vital Sign    Days of Exercise per Week: 0 days    Minutes of Exercise per Session: 0 min  Stress: No Stress Concern Present (06/05/2021)   [employer] of Occupational Health - Occupational Stress Questionnaire    Feeling of Stress : Not at all  Social Connections: Socially Isolated (06/05/2021)   Social Connection and Isolation Panel    Frequency of Communication with Friends and Family: Three times a week    Frequency of Social Gatherings with Friends and Family: Three times a week    Attends Religious Services: Never    Active Member of Clubs or Organizations: No    Attends Banker Meetings: Never    Marital Status: Divorced    Family History  Problem Relation Age of Onset   Hypothyroidism Mother    Diabetes Mother    Hypertension Mother    Thyroid  disease Mother    Hypertension Father    Stroke Father    Colon cancer Neg Hx     Outpatient Encounter Medications as of 08/12/2023  Medication Sig   Semaglutide ,0.25 or 0.5MG /DOS, (OZEMPIC , 0.25 OR 0.5 MG/DOSE,) 2 MG/3ML SOPN Inject 0.25 mg into the skin once a week.   Accu-Chek Softclix Lancets lancets Use to test BG twice  daily   acetaminophen  (TYLENOL ) 500 MG tablet Take 500 mg by mouth every 6 (six) hours as needed. Pain.   atorvastatin  (LIPITOR) 40 MG tablet Take 1 tablet (40 mg total) by mouth daily.   Blood Glucose Monitoring Suppl (ACCU-CHEK GUIDE) w/Device KIT Use to check BG twice daily   Cholecalciferol  50 MCG (2000 UT) CAPS Take 1 capsule (2,000 Units total) by mouth daily with breakfast.   collagenase  (SANTYL ) ointment Apply 1 application topically daily.   FARXIGA  10 MG TABS tablet TAKE 1 TABLET(10 MG) BY MOUTH DAILY BEFORE BREAKFAST   glucose blood (TRUE METRIX BLOOD GLUCOSE TEST) test strip USE TO CHECK BLOOD GLUCOSE TWICE DAILY   losartan -hydrochlorothiazide  (HYZAAR) 100-25 MG tablet TAKE ONE TABLET BY MOUTH ONCE DAILY   metFORMIN  (GLUCOPHAGE ) 1000 MG tablet TAKE 1 TABLET BY MOUTH TWICE DAILY WITH MEALS   omeprazole  (PRILOSEC) 20 MG capsule Take 1 capsule (20 mg total) by mouth daily as needed. Heartburn   silver sulfADIAZINE (SILVADENE) 1 % cream Apply 1 application  topically 2 (two) times daily as needed.   [DISCONTINUED] glimepiride  (AMARYL ) 2 MG tablet Take 1 tablet (2 mg total) by mouth daily before breakfast.   [DISCONTINUED] Semaglutide ,0.25 or 0.5MG /DOS, (OZEMPIC , 0.25 OR 0.5 MG/DOSE,) 2 MG/3ML SOPN Inject 0.5 mg into the skin once a week.   No facility-administered encounter medications on file as of 08/12/2023.    ALLERGIES: Allergies  Allergen Reactions   Sulfamethoxazole -Trimethoprim  Itching and Rash    Rash all over body, itching    VACCINATION STATUS: Immunization History  Administered Date(s) Administered   Fluad Quad(high Dose 65+) 10/25/2021   Influenza, Quadrivalent, Recombinant, Inj, Pf 12/06/2020   Moderna SARS-COV2 Booster Vaccination 06/09/2020   Moderna Sars-Covid-2 Vaccination 09/13/2019, 10/11/2019, 12/06/2020   Pfizer(Comirnaty)Fall Seasonal Vaccine 12 years and older 01/12/2022   Pneumococcal Polysaccharide-23 11/03/2010, 05/27/2013, 06/23/2019   Rsv,  Bivalent, Protein Subunit Rsvpref,pf Marlow) 12/02/2021   Td 08/12/2019   Tdap 08/12/2019    Diabetes Kim [name] presents for her follow-up diabetic visit. Kim [name] has type 2 diabetes mellitus. Onset time: Kim [name] was diagnosed at approximate age of 39 years. Her disease course has been fluctuating. There are no hypoglycemic associated symptoms. Pertinent negatives for hypoglycemia include no  confusion, headaches, pallor or seizures. Associated symptoms include blurred vision and fatigue. Pertinent negatives for diabetes include no chest pain, no polydipsia, no polyphagia and no polyuria. There are no hypoglycemic complications. Symptoms are improving. Diabetic complications include nephropathy. Risk factors for coronary artery disease include diabetes mellitus, dyslipidemia, hypertension, family history, tobacco exposure, sedentary lifestyle and post-menopausal. Her weight is decreasing steadily. Kim [name] is following a generally unhealthy diet. When asked about meal planning, Kim [name] reported none. Kim [name] has not had a previous visit with a dietitian. Kim [name] rarely (Kim [name] is wheelchair-bound due to deconditioning/disequilibrium.  ) participates in exercise. Her home blood glucose trend is fluctuating minimally. Her breakfast blood glucose range is generally 90-110 mg/dl. Her bedtime blood glucose range is generally 110-130 mg/dl. Her overall blood glucose range is 110-130 mg/dl. Kim [name] presents with tight glycemic profile including random hypoglycemia.  Kim [name] is on a different medication regimen and Kim [name] was given during her last visit.  Kim [name] is currently on Amaryl , Farxiga , metformin , Ozempic  .her point of A1c is 6.1%, progressively improving from 11.5%.     ) An ACE inhibitor/angiotensin II receptor blocker is being taken. Eye exam is current.  Hyperlipidemia This is a chronic problem. The current episode started more than 1 year ago. Exacerbating diseases include diabetes. Pertinent negatives include no chest pain, myalgias or  shortness of breath. Current antihyperlipidemic treatment includes statins. Risk factors for coronary artery disease include diabetes mellitus, hypertension, dyslipidemia, family history, post-menopausal and a sedentary lifestyle.  Hypertension This is a chronic problem. The current episode started more than 1 year ago. Associated symptoms include blurred vision. Pertinent negatives include no chest pain, headaches, palpitations or shortness of breath. Risk factors for coronary artery disease include diabetes mellitus, dyslipidemia, family history, post-menopausal state, smoking/tobacco exposure and sedentary lifestyle.     Objective:       08/12/2023   10:50 AM 04/19/2023   10:02 AM 04/12/2023   10:50 AM  Vitals with BMI  Height  5' 6   Weight  159 lbs 13 oz   BMI  25.8   Systolic 116 120 893  Diastolic 68 71 68  Pulse 92 75 96    BP 116/68   Pulse 92   Wt Readings from Last 3 Encounters:  04/19/23 159 lb 12.8 oz (72.5 kg)  11/23/22 158 lb 1.9 oz (71.7 kg)  08/24/22 160 lb 1.3 oz (72.6 kg)      CMP ( most recent) CMP     Component Value Date/Time   NA 139 08/02/2023 0944   K 4.7 08/02/2023 0944   CL 98 08/02/2023 0944   CO2 23 08/02/2023 0944   GLUCOSE 93 08/02/2023 0944   GLUCOSE 228 (H) 07/21/2021 0329   BUN 25 08/02/2023 0944   CREATININE 1.22 (H) 08/02/2023 0944   CREATININE 0.95 07/21/2021 0329   CALCIUM  9.7 08/02/2023 0944   PROT 6.7 08/02/2023 0944   ALBUMIN 4.1 08/02/2023 0944   AST 23 08/02/2023 0944   ALT 20 08/02/2023 0944   ALKPHOS 141 (H) 08/02/2023 0944   BILITOT 0.4 08/02/2023 0944   GFRNONAA 34 (L) 02/07/2021 1745   GFRNONAA 65 03/26/2019 1517   GFRAA 68 12/02/2019 1203   GFRAA 75 03/26/2019 1517    Diabetic Labs (most recent): Lab Results  Component Value Date   HGBA1C 6.1 08/12/2023   HGBA1C 6.8 (H) 04/19/2023   HGBA1C 6.7 04/12/2023    Lipid Panel     Component Value Date/Time   CHOL 130 08/02/2023 0944   TRIG  107 08/02/2023 0944    HDL 46 08/02/2023 0944   CHOLHDL 2.8 08/02/2023 0944   CHOLHDL 4.3 03/26/2019 1517   LDLCALC 64 08/02/2023 0944   LDLCALC 122 (H) 03/26/2019 1517   LABVLDL 20 08/02/2023 0944      Latest Reference Range & Units 05/25/22 10:00 05/25/22 10:20  TSH 0.450 - 4.500 uIU/mL 4.130 4.170  T4,Free(Direct) 0.82 - 1.77 ng/dL 8.62 8.71   Assessment & Plan:   1. Uncontrolled type 2 diabetes mellitus with hyperglycemia (HCC)  - VALOREE AGENT has currently uncontrolled symptomatic type 2 DM since  68 years of age.   Kim [name] presents her log which shows significantly improved glycemic profile to near target levels.    Kim [name] presents with tight glycemic profile including random hypoglycemia.  Kim [name] is on a different medication regimen and Kim [name] was given during her last visit.  Kim [name] is currently on Amaryl , Farxiga , metformin , Ozempic  .her point of A1c is 6.1%, progressively improving from 11.5%.    Recent labs reviewed. - I had a long discussion with her about the progressive nature of diabetes and the pathology behind its complications. -her diabetes is complicated by CKD, sedentary life, smoking and Kim [name] remains at a high risk for more acute and chronic complications which include CAD, CVA, retinopathy, and neuropathy. These are all discussed in detail with her.  - I have counseled her on diet  and weight management  by adopting a carbohydrate restricted/protein rich diet. Patient is encouraged to switch to  unprocessed or minimally processed     complex starch and increased protein intake (animal or plant source), fruits, and vegetables. -  Kim [name] is advised to stick to a routine mealtimes to eat 3 meals  a day and avoid unnecessary snacks ( to snack only to correct hypoglycemia).    - Kim [name] acknowledges that there is a room for improvement in her food and drink choices. - Suggestion is made for her to avoid simple carbohydrates  from her diet including Cakes, Sweet Desserts, Ice Cream, Soda (diet and  regular), Sweet Tea, Candies, Chips, Cookies, Store Bought Juices, Alcohol , Artificial Sweeteners,  Coffee Creamer, and Sugar-free Products, Lemonade. This will help patient to have more stable blood glucose profile and potentially avoid unintended weight gain.  - I have approached her with the following individualized plan to manage  her diabetes and patient agrees:   -Based on her presentation with tightly controlled glycemic profile, Kim [name] is advised to discontinue Amaryl  . - In light of her continued smoking putting her at risk for pancreatitis, Kim [name] is advised to maintain Ozempic  at a lower dose at 0.25 mg subcutaneously weekly.    Kim [name] is advised to continue Farxiga  10 mg daily at breakfast and metformin  1000 mg p.o. twice daily.   -  Kim [name] is encouraged to call clinic for blood glucose levels less than 70 or greater than 200 mg/Dl at fasting.  - Specific targets for  A1c;  LDL, HDL,  and Triglycerides were discussed with the patient.  2) Blood Pressure /Hypertension:  Her blood pressure is controlled to target. Kim [name] is advised to continue her current medications including losartan /HCTZ 100/25 mg p.o. daily with breakfast . The patient was counseled on the dangers of tobacco use, and was advised to quit.  Reviewed strategies to maximize success, including removing cigarettes and smoking materials from environment.   3) Lipids/Hyperlipidemia:   Review of her recent lipid panel showed controlled LDL at 64.  Kim [name] is advised to continue atorvastatin  40  mg p.o. nightly.  Precautions discussed with her.      4)  Weight/Diet:  There is no height or weight on file to calculate BMI.  -Kim [name] is a candidate for some  weight loss.   I discussed with her the fact that loss of 5 - 10% of her  current body weight will have the most impact on her diabetes management.  Exercise, and detailed carbohydrates information provided  -  detailed on discharge instructions.  5) Chronic Care/Health Maintenance:  -Kim [name]   is on ACEI/ARB and Statin medications and  is encouraged to initiate and continue to follow up with Ophthalmology, Dentist,  Podiatrist at least yearly or according to recommendations, and advised to  Quit smoking. I have recommended yearly flu vaccine and pneumonia vaccine at least every 5 years;  and  sleep for at least 7 hours a day.  Kim [name] is wheelchair-bound, cannot exercise optimally.   6) subclinical hypothyroidism  -Kim [name] did have high antithyroid antibodies.  However, Kim [name] continues to have euthyroid state based on her current thyroid  function test., Kim [name] will not need intervention with thyroid  hormone at this time.  The patient was counseled on the dangers of tobacco use, and was advised to quit.  Reviewed strategies to maximize success, including removing cigarettes and smoking materials from environment.  Kim [name] does have subclinical hypothyroidism, will not be initiated on thyroid  hormone replacement at this time.  Kim [name] will need continued with her thyroid  function test.   - Kim [name] is  advised to maintain close follow up with Zarwolo, Gloria, FNP for primary care needs, as well as her other providers for optimal and coordinated care.   I spent  26  minutes in the care of the patient today including review of labs from CMP, Lipids, Thyroid  Function, Hematology (current and previous including abstractions from other facilities); face-to-face time discussing  her blood glucose readings/logs, discussing hypoglycemia and hyperglycemia episodes and symptoms, medications doses, her options of short and long term treatment based on the latest standards of care / guidelines;  discussion about incorporating lifestyle medicine;  and documenting the encounter. Risk reduction counseling performed per USPSTF guidelines to reduce  cardiovascular risk factors.     Please refer to Patient Instructions for Blood Glucose Monitoring and Insulin /Medications Dosing Guide  in media tab for additional information. Please   also refer to  Patient Self Inventory in the Media  tab for reviewed elements of pertinent patient history.  Kim [name] participated in the discussions, expressed understanding, and voiced agreement with the above plans.  All questions were answered to her satisfaction. Kim [name] is encouraged to contact clinic should Kim [name] have any questions or concerns prior to her return visit.      Follow up plan: - Return in about 4 months (around 12/12/2023) for Bring Meter/CGM Device/Logs- A1c in Office.  Ranny Earl, MD [facility] [address] [place], [place] [address] Phone: [phone]  Fax: [phone]    08/12/2023, 12:52 PM  This note was partially dictated with voice recognition software. Similar sounding words can be transcribed inadequately or may not  be corrected upon review.

## 2023-08-12 NOTE — Patient Instructions (Signed)

## 2023-08-19 ENCOUNTER — Other Ambulatory Visit (HOSPITAL_COMMUNITY): Payer: Self-pay | Admitting: Family Medicine

## 2023-08-19 ENCOUNTER — Ambulatory Visit (INDEPENDENT_AMBULATORY_CARE_PROVIDER_SITE_OTHER): Admitting: Family Medicine

## 2023-08-19 ENCOUNTER — Encounter: Payer: Self-pay | Admitting: Family Medicine

## 2023-08-19 VITALS — BP 105/63 | HR 95 | Resp 16 | Ht 66.0 in | Wt 154.1 lb

## 2023-08-19 DIAGNOSIS — Z7984 Long term (current) use of oral hypoglycemic drugs: Secondary | ICD-10-CM

## 2023-08-19 DIAGNOSIS — Z1231 Encounter for screening mammogram for malignant neoplasm of breast: Secondary | ICD-10-CM

## 2023-08-19 DIAGNOSIS — E038 Other specified hypothyroidism: Secondary | ICD-10-CM | POA: Diagnosis not present

## 2023-08-19 DIAGNOSIS — E559 Vitamin D deficiency, unspecified: Secondary | ICD-10-CM | POA: Diagnosis not present

## 2023-08-19 DIAGNOSIS — E7849 Other hyperlipidemia: Secondary | ICD-10-CM

## 2023-08-19 DIAGNOSIS — E1165 Type 2 diabetes mellitus with hyperglycemia: Secondary | ICD-10-CM

## 2023-08-19 DIAGNOSIS — I10 Essential (primary) hypertension: Secondary | ICD-10-CM

## 2023-08-19 NOTE — Patient Instructions (Signed)
 I appreciate the opportunity to provide care to you today!    Follow up:  4 months  Labs: please stop by the lab today to get your blood drawn (CBC, CMP, TSH, Lipid profile,  Vit D)  For a Healthier YOU, I Recommend: Reducing your intake of sugar, sodium, carbohydrates, and saturated fats. Increasing your fiber intake by incorporating more whole grains, fruits, and vegetables into your meals. Setting healthy goals with a focus on lowering your consumption of carbs, sugar, and unhealthy fats. Adding variety to your diet by including a wide range of fruits and vegetables. Cutting back on soda and limiting processed foods as much as possible. Staying active: In addition to taking your weight loss medication, aim for at least 150 minutes of moderate-intensity physical activity each week for optimal results.    Please follow up if your symptoms worsen or fail to improve.    Please continue to a heart-healthy diet and increase your physical activities. Try to exercise for at least five days a week.    It was a pleasure to see you and I look forward to continuing to work together on your health and well-being. Please do not hesitate to call the office if you need care or have questions about your care.  In case of emergency, please visit the Emergency Department for urgent care, or contact our clinic at [phone] to schedule an appointment. We're here to help you!   Have a wonderful day and week. With Gratitude, Beverely Suen MSN, FNP-BC

## 2023-08-19 NOTE — Assessment & Plan Note (Signed)
 Controlled She takes losarta-hydrochlorothiazide  12.5 mg daily Denies headaches, dizziness, blurred vision, chest pain, palpitation Low-sodium diet with increased physical activity encouraged BP Readings from Last 3 Encounters:  08/19/23 105/63  08/12/23 116/68  04/19/23 120/71

## 2023-08-19 NOTE — Progress Notes (Signed)
 Established Patient Office Visit  Subjective:  Patient ID: Kim [name], female    DOB: 1955/04/08  Age: 68 y.o. MRN: [id number]  CC:  Chief Complaint  Patient presents with   Hypertension    4 month follow up     HPI Kim [name] is a 68 y.o. female presents for follow-up of chronic medical conditions, including essential hypertension, GERD, type 2 diabetes, and vitamin D  deficiency. She reports not taking glipizide  for the past two weeks per instructions from her endocrinologist. She denies any symptoms of hyperglycemia or hypoglycemia. Her most recent hemoglobin A1c is 6.1, indicating good glycemic control.   Past Medical History:  Diagnosis Date   Cancer [facility])    history of leukemia   Community acquired pneumonia 05/26/2013   Diabetes mellitus without complication (HCC)    Encounter for screening mammogram for malignant neoplasm of breast 03/26/2019   Hypercholesteremia    Hypertension    Neuropathy    Seasonal allergies     Past Surgical History:  Procedure Laterality Date   COLONOSCOPY  12/28/2011   Procedure: COLONOSCOPY;  Surgeon: Margo LITTIE Haddock, MD;  [facility];  Service: Endoscopy;  Laterality: N/A;  9:30 AM-changed to 8:30 [name] to notify pt   FOOT SURGERY Left    LAPAROSCOPY     TUBAL LIGATION     TUNNELED VENOUS CATHETER PLACEMENT      Family History  Problem Relation Age of Onset   Hypothyroidism Mother    Diabetes Mother    Hypertension Mother    Thyroid  disease Mother    Hypertension Father    Stroke Father    Colon cancer Neg Hx     Social History   Socioeconomic History   Marital status: Divorced    Spouse name: Not on file   Number of children: 2   Years of education: Not on file   Highest education level: 11th grade  Occupational History   Not on file  Tobacco Use   Smoking status: Every Day    Current packs/day: 0.30    Average packs/day: 0.3 packs/day for 40.0 years (12.0 ttl pk-yrs)    Types: Cigarettes    Smokeless tobacco: Never   Tobacco comments:    Working on quitting  Substance and Sexual Activity   Alcohol use: No   Drug use: No   Sexual activity: Not Currently  Other Topics Concern   Not on file  Social History Narrative   Lives with 89 year old mother -caregiver    2 grown children   Son [name] in [place]    Daughter Delon in [place] with daughter Lauraine       Enjoys: shopping, play games       Diet: not as good a she would like, eats all food groups   Caffeine: 2 cups of coffee in the mornings, coke daily   Water : 3-4 cups daily       Wear seat belt    Smoke detectors at home   Does not use phone while driving      Social Drivers of Health   Financial Resource Strain: Low Risk  (06/05/2021)   Overall Financial Resource Strain (CARDIA)    Difficulty of Paying Living Expenses: Not hard at all  Food Insecurity: No Food Insecurity (06/05/2021)   Hunger Vital Sign    Worried About Running Out of Food in the Last Year: Never true    Ran Out of Food in the  Last Year: Never true  Transportation Needs: No Transportation Needs (06/05/2021)   PRAPARE - Administrator, Civil Service (Medical): No    Lack of Transportation (Non-Medical): No  Physical Activity: Inactive (06/05/2021)   Exercise Vital Sign    Days of Exercise per Week: 0 days    Minutes of Exercise per Session: 0 min  Stress: No Stress Concern Present (06/05/2021)   [employer] of Occupational Health - Occupational Stress Questionnaire    Feeling of Stress : Not at all  Social Connections: Socially Isolated (06/05/2021)   Social Connection and Isolation Panel    Frequency of Communication with Friends and Family: Three times a week    Frequency of Social Gatherings with Friends and Family: Three times a week    Attends Religious Services: Never    Active Member of Clubs or Organizations: No    Attends Banker Meetings: Never    Marital Status: Divorced   Catering manager Violence: Not At Risk (06/01/2020)   Humiliation, Afraid, Rape, and Kick questionnaire    Fear of Current or Ex-Partner: No    Emotionally Abused: No    Physically Abused: No    Sexually Abused: No    Outpatient Medications Prior to Visit  Medication Sig Dispense Refill   Accu-Chek Softclix Lancets lancets Use to test BG twice daily 200 each 2   acetaminophen  (TYLENOL ) 500 MG tablet Take 500 mg by mouth every 6 (six) hours as needed. Pain.     atorvastatin  (LIPITOR) 40 MG tablet Take 1 tablet (40 mg total) by mouth daily. 90 tablet 3   Blood Glucose Monitoring Suppl (ACCU-CHEK GUIDE) w/Device KIT Use to check BG twice daily 1 kit 0   Cholecalciferol  50 MCG (2000 UT) CAPS Take 1 capsule (2,000 Units total) by mouth daily with breakfast. 90 capsule 1   collagenase  (SANTYL ) ointment Apply 1 application topically daily. 30 g 2   FARXIGA  10 MG TABS tablet TAKE 1 TABLET(10 MG) BY MOUTH DAILY BEFORE BREAKFAST 30 tablet 2   glucose blood (TRUE METRIX BLOOD GLUCOSE TEST) test strip USE TO CHECK BLOOD GLUCOSE TWICE DAILY 200 strip 2   losartan -hydrochlorothiazide  (HYZAAR) 100-25 MG tablet TAKE ONE TABLET BY MOUTH ONCE DAILY 90 tablet 1   metFORMIN  (GLUCOPHAGE ) 1000 MG tablet TAKE 1 TABLET BY MOUTH TWICE DAILY WITH MEALS 180 tablet 0   omeprazole  (PRILOSEC) 20 MG capsule Take 1 capsule (20 mg total) by mouth daily as needed. Heartburn 30 capsule 3   Semaglutide ,0.25 or 0.5MG /DOS, (OZEMPIC , 0.25 OR 0.5 MG/DOSE,) 2 MG/3ML SOPN Inject 0.25 mg into the skin once a week. 9 mL 1   silver sulfADIAZINE (SILVADENE) 1 % cream Apply 1 application  topically 2 (two) times daily as needed.     No facility-administered medications prior to visit.    Allergies  Allergen Reactions   Sulfamethoxazole -Trimethoprim  Itching and Rash    Rash all over body, itching    ROS Review of Systems  Constitutional:  Negative for chills and fever.  Eyes:  Negative for visual disturbance.  Respiratory:   Negative for chest tightness and shortness of breath.   Neurological:  Negative for dizziness and headaches.      Objective:    Physical Exam HENT:     Head: Normocephalic.     Mouth/Throat:     Mouth: Mucous membranes are moist.  Cardiovascular:     Rate and Rhythm: Normal rate.     Heart sounds: Normal heart sounds.  Pulmonary:  Effort: Pulmonary effort is normal.     Breath sounds: Normal breath sounds.  Neurological:     Mental Status: She is alert.     BP 105/63   Pulse 95   Resp 16   Ht 5' 6 (1.676 m)   Wt 154 lb 1.3 oz (69.9 kg)   SpO2 92%   BMI 24.87 kg/m  Wt Readings from Last 3 Encounters:  08/19/23 154 lb 1.3 oz (69.9 kg)  04/19/23 159 lb 12.8 oz (72.5 kg)  11/23/22 158 lb 1.9 oz (71.7 kg)    Lab Results  Component Value Date   TSH 4.600 (H) 08/02/2023   Lab Results  Component Value Date   WBC 7.6 04/19/2023   HGB 15.8 04/19/2023   HCT 46.6 04/19/2023   MCV 91 04/19/2023   PLT 298 04/19/2023   Lab Results  Component Value Date   NA 139 08/02/2023   K 4.7 08/02/2023   CO2 23 08/02/2023   GLUCOSE 93 08/02/2023   BUN 25 08/02/2023   CREATININE 1.22 (H) 08/02/2023   BILITOT 0.4 08/02/2023   ALKPHOS 141 (H) 08/02/2023   AST 23 08/02/2023   ALT 20 08/02/2023   PROT 6.7 08/02/2023   ALBUMIN 4.1 08/02/2023   CALCIUM  9.7 08/02/2023   ANIONGAP 12 02/07/2021   EGFR 48 (L) 08/02/2023   Lab Results  Component Value Date   CHOL 130 08/02/2023   Lab Results  Component Value Date   HDL 46 08/02/2023   Lab Results  Component Value Date   LDLCALC 64 08/02/2023   Lab Results  Component Value Date   TRIG 107 08/02/2023   Lab Results  Component Value Date   CHOLHDL 2.8 08/02/2023   Lab Results  Component Value Date   HGBA1C 6.1 08/12/2023      Assessment & Plan:  Essential hypertension, benign Assessment & Plan: Controlled She takes losarta-hydrochlorothiazide  12.5 mg daily Denies headaches, dizziness, blurred vision, chest  pain, palpitation Low-sodium diet with increased physical activity encouraged BP Readings from Last 3 Encounters:  08/19/23 105/63  08/12/23 116/68  04/19/23 120/71      Uncontrolled type 2 diabetes mellitus with hyperglycemia (HCC) Assessment & Plan: Encouraged to continue taking Rybelsus  7 mg daily, metformin  1000 mg twice daily, and Farxiga  10 mg daily. Encouraged to decrease her intake of high sugar food and beverages with increase physical activity Lab Results  Component Value Date   HGBA1C 6.1 08/12/2023      Vitamin D  deficiency -     VITAMIN D  25 Hydroxy (Vit-D Deficiency, Fractures)  TSH (thyroid -stimulating hormone deficiency) -     TSH + free T4  Other hyperlipidemia -     Lipid panel -     CMP14+EGFR -     CBC with Differential/Platelet   Note: This chart has been completed using Engineer, civil (consulting) software, and while attempts have been made to ensure accuracy, certain words and phrases may not be transcribed as intended.   Follow-up: Return in about 4 months (around 12/20/2023).   Kemyah Buser, FNP

## 2023-08-19 NOTE — Assessment & Plan Note (Signed)
 Encouraged to continue taking Rybelsus  7 mg daily, metformin  1000 mg twice daily, and Farxiga  10 mg daily. Encouraged to decrease her intake of high sugar food and beverages with increase physical activity Lab Results  Component Value Date   HGBA1C 6.1 08/12/2023

## 2023-08-20 LAB — VITAMIN D 25 HYDROXY (VIT D DEFICIENCY, FRACTURES): Vit D, 25-Hydroxy: 49.7 ng/mL (ref 30.0–100.0)

## 2023-08-20 LAB — CMP14+EGFR
ALT: 18 IU/L (ref 0–32)
AST: 19 IU/L (ref 0–40)
Albumin: 4.2 g/dL (ref 3.9–4.9)
Alkaline Phosphatase: 128 IU/L — ABNORMAL HIGH (ref 44–121)
BUN/Creatinine Ratio: 28 (ref 12–28)
BUN: 29 mg/dL — ABNORMAL HIGH (ref 8–27)
Bilirubin Total: 0.4 mg/dL (ref 0.0–1.2)
CO2: 24 mmol/L (ref 20–29)
Calcium: 9.5 mg/dL (ref 8.7–10.3)
Chloride: 101 mmol/L (ref 96–106)
Creatinine, Ser: 1.04 mg/dL — ABNORMAL HIGH (ref 0.57–1.00)
Globulin, Total: 2.5 g/dL (ref 1.5–4.5)
Glucose: 114 mg/dL — ABNORMAL HIGH (ref 70–99)
Potassium: 4.5 mmol/L (ref 3.5–5.2)
Sodium: 142 mmol/L (ref 134–144)
Total Protein: 6.7 g/dL (ref 6.0–8.5)
eGFR: 58 mL/min/1.73 — ABNORMAL LOW

## 2023-08-20 LAB — LIPID PANEL
Chol/HDL Ratio: 2.9 ratio (ref 0.0–4.4)
Cholesterol, Total: 120 mg/dL (ref 100–199)
HDL: 42 mg/dL
LDL Chol Calc (NIH): 59 mg/dL (ref 0–99)
Triglycerides: 103 mg/dL (ref 0–149)
VLDL Cholesterol Cal: 19 mg/dL (ref 5–40)

## 2023-08-20 LAB — CBC WITH DIFFERENTIAL/PLATELET
Basophils Absolute: 0.1 x10E3/uL (ref 0.0–0.2)
Basos: 1 %
EOS (ABSOLUTE): 0.2 x10E3/uL (ref 0.0–0.4)
Eos: 2 %
Hematocrit: 43.4 % (ref 34.0–46.6)
Hemoglobin: 14.4 g/dL (ref 11.1–15.9)
Immature Grans (Abs): 0 x10E3/uL (ref 0.0–0.1)
Immature Granulocytes: 0 %
Lymphocytes Absolute: 2.2 x10E3/uL (ref 0.7–3.1)
Lymphs: 26 %
MCH: 30.7 pg (ref 26.6–33.0)
MCHC: 33.2 g/dL (ref 31.5–35.7)
MCV: 93 fL (ref 79–97)
Monocytes Absolute: 0.5 x10E3/uL (ref 0.1–0.9)
Monocytes: 6 %
Neutrophils Absolute: 5.5 x10E3/uL (ref 1.4–7.0)
Neutrophils: 65 %
Platelets: 296 x10E3/uL (ref 150–450)
RBC: 4.69 x10E6/uL (ref 3.77–5.28)
RDW: 12.8 % (ref 11.7–15.4)
WBC: 8.4 x10E3/uL (ref 3.4–10.8)

## 2023-08-20 LAB — TSH+FREE T4
Free T4: 1.31 ng/dL (ref 0.82–1.77)
TSH: 2.88 u[IU]/mL (ref 0.450–4.500)

## 2023-08-21 ENCOUNTER — Ambulatory Visit (HOSPITAL_COMMUNITY)

## 2023-08-22 DIAGNOSIS — L97322 Non-pressure chronic ulcer of left ankle with fat layer exposed: Secondary | ICD-10-CM | POA: Diagnosis not present

## 2023-08-23 ENCOUNTER — Ambulatory Visit: Payer: Self-pay | Admitting: Family Medicine

## 2023-08-23 ENCOUNTER — Other Ambulatory Visit: Payer: Self-pay | Admitting: Family Medicine

## 2023-08-23 DIAGNOSIS — E1169 Type 2 diabetes mellitus with other specified complication: Secondary | ICD-10-CM

## 2023-08-26 ENCOUNTER — Other Ambulatory Visit: Payer: Self-pay | Admitting: Family Medicine

## 2023-08-26 DIAGNOSIS — E1165 Type 2 diabetes mellitus with hyperglycemia: Secondary | ICD-10-CM

## 2023-08-26 DIAGNOSIS — K219 Gastro-esophageal reflux disease without esophagitis: Secondary | ICD-10-CM

## 2023-08-29 ENCOUNTER — Ambulatory Visit (HOSPITAL_COMMUNITY)

## 2023-09-03 ENCOUNTER — Other Ambulatory Visit: Payer: Self-pay | Admitting: Family Medicine

## 2023-09-03 DIAGNOSIS — E1165 Type 2 diabetes mellitus with hyperglycemia: Secondary | ICD-10-CM

## 2023-09-04 ENCOUNTER — Ambulatory Visit (HOSPITAL_COMMUNITY)
Admission: RE | Admit: 2023-09-04 | Discharge: 2023-09-04 | Disposition: A | Discharge status: Home / Self Care | Source: Ambulatory Visit | Attending: Family Medicine | Admitting: Family Medicine

## 2023-09-04 ENCOUNTER — Encounter (HOSPITAL_COMMUNITY): Payer: Self-pay

## 2023-09-04 DIAGNOSIS — Z1231 Encounter for screening mammogram for malignant neoplasm of breast: Secondary | ICD-10-CM | POA: Insufficient documentation

## 2023-09-05 DIAGNOSIS — L03115 Cellulitis of right lower limb: Secondary | ICD-10-CM | POA: Diagnosis not present

## 2023-09-05 DIAGNOSIS — L97322 Non-pressure chronic ulcer of left ankle with fat layer exposed: Secondary | ICD-10-CM | POA: Diagnosis not present

## 2023-09-05 DIAGNOSIS — L97512 Non-pressure chronic ulcer of other part of right foot with fat layer exposed: Secondary | ICD-10-CM | POA: Diagnosis not present

## 2023-09-25 ENCOUNTER — Ambulatory Visit: Payer: Self-pay

## 2023-09-25 VITALS — Ht 66.0 in | Wt 154.0 lb

## 2023-09-25 DIAGNOSIS — Z0000 Encounter for general adult medical examination without abnormal findings: Secondary | ICD-10-CM

## 2023-09-25 DIAGNOSIS — E1165 Type 2 diabetes mellitus with hyperglycemia: Secondary | ICD-10-CM

## 2023-09-25 DIAGNOSIS — Z0001 Encounter for general adult medical examination with abnormal findings: Secondary | ICD-10-CM | POA: Diagnosis not present

## 2023-09-25 DIAGNOSIS — F17210 Nicotine dependence, cigarettes, uncomplicated: Secondary | ICD-10-CM

## 2023-09-25 DIAGNOSIS — Z780 Asymptomatic menopausal state: Secondary | ICD-10-CM

## 2023-09-25 DIAGNOSIS — F17200 Nicotine dependence, unspecified, uncomplicated: Secondary | ICD-10-CM

## 2023-09-25 NOTE — Progress Notes (Signed)
 Please attest and cosign this visit due to patients primary care provider not being in the office at the time the visit was completed.   Subjective:   Kim [name] is a 68 y.o. who presents for a Medicare Wellness preventive visit.  As a reminder, Annual Wellness Visits don't include a physical exam, and some assessments may be limited, especially if this visit is performed virtually. We may recommend an in-person follow-up visit with your provider if needed.  Visit Complete: Virtual I connected with  Kim [name] on 09/25/23 by a audio enabled telemedicine application and verified that I am speaking with the correct person using two identifiers.  Patient Location: Home  Provider Location: Office/Clinic  I discussed the limitations of evaluation and management by telemedicine. The patient expressed understanding and agreed to proceed.  Vital Signs: Because this visit was a virtual/telehealth visit, some criteria may be missing or patient reported. Any vitals not documented were not able to be obtained and vitals that have been documented are patient reported.  VideoDeclined- This patient declined Librarian, academic. Therefore the visit was completed with audio only.  Persons Participating in Visit: Patient.  AWV Questionnaire: No: Patient Medicare AWV questionnaire was not completed prior to this visit.  Cardiac Risk Factors include: advanced age (>56men, >16 women);diabetes mellitus;dyslipidemia;hypertension;sedentary lifestyle;smoking/ tobacco exposure     Objective:    Today's Vitals   09/25/23 1031  Weight: 154 lb (69.9 kg)  Height: 5' 6 (1.676 m)  PainSc: 0-No pain   Body mass index is 24.86 kg/m.     09/25/2023   10:28 AM 06/11/2022    9:35 AM 06/05/2021    9:41 AM 02/07/2021    3:02 PM 06/01/2020    9:31 AM 05/26/2013    5:41 PM 12/28/2011    8:20 AM  Advanced Directives  Does Patient Have a Medical Advance Directive? No No No No  No Patient does not have advance directive  Patient would like information;Patient does not have advance directive   Does patient want to make changes to medical advance directive?  No - Patient declined       Would patient like information on creating a medical advance directive? No - Patient declined  No - Patient declined No - Patient declined No - Patient declined    Pre-existing out of facility DNR order (yellow form or pink MOST form)      No  No      Data saved with a previous flowsheet row definition    Current Medications (verified) Outpatient Encounter Medications as of 09/25/2023  Medication Sig   Accu-Chek Softclix Lancets lancets Use to test BG twice daily   acetaminophen  (TYLENOL ) 500 MG tablet Take 500 mg by mouth every 6 (six) hours as needed. Pain.   atorvastatin  (LIPITOR) 40 MG tablet TAKE ONE TABLET BY MOUTH ONCE DAILY   Blood Glucose Monitoring Suppl (ACCU-CHEK GUIDE) w/Device KIT Use to check BG twice daily   Cholecalciferol  50 MCG (2000 UT) CAPS Take 1 capsule (2,000 Units total) by mouth daily with breakfast.   collagenase  (SANTYL ) ointment Apply 1 application topically daily.   FARXIGA  10 MG TABS tablet TAKE 1 TABLET(10 MG) BY MOUTH DAILY BEFORE BREAKFAST   glucose blood (TRUE METRIX BLOOD GLUCOSE TEST) test strip USE TO CHECK BLOOD GLUCOSE TWICE DAILY   losartan -hydrochlorothiazide  (HYZAAR) 100-25 MG tablet TAKE ONE TABLET BY MOUTH ONCE DAILY   metFORMIN  (GLUCOPHAGE ) 1000 MG tablet TAKE 1 TABLET BY MOUTH TWICE DAILY WITH  MEALS   omeprazole  (PRILOSEC) 20 MG capsule TAKE 1 CAPSULE(20 MG) BY MOUTH DAILY AS NEEDED FOR HEARTBURN   Semaglutide ,0.25 or 0.5MG /DOS, (OZEMPIC , 0.25 OR 0.5 MG/DOSE,) 2 MG/3ML SOPN Inject 0.25 mg into the skin once a week.   silver sulfADIAZINE (SILVADENE) 1 % cream Apply 1 application  topically 2 (two) times daily as needed.   No facility-administered encounter medications on file as of 09/25/2023.    Allergies  (verified) Sulfamethoxazole -trimethoprim    History: Past Medical History:  Diagnosis Date   Cancer (HCC)    history of leukemia   Community acquired pneumonia 05/26/2013   Diabetes mellitus without complication (HCC)    Encounter for screening mammogram for malignant neoplasm of breast 03/26/2019   Hypercholesteremia    Hypertension    Neuropathy    Seasonal allergies    Past Surgical History:  Procedure Laterality Date   COLONOSCOPY  12/28/2011   Procedure: COLONOSCOPY;  Surgeon: Margo LITTIE Haddock, MD;  [facility];  Service: Endoscopy;  Laterality: N/A;  9:30 AM-changed to 8:30 [name] to notify pt   FOOT SURGERY Left    LAPAROSCOPY     TUBAL LIGATION     TUNNELED VENOUS CATHETER PLACEMENT     Family History  Problem Relation Age of Onset   Hypothyroidism Mother    Diabetes Mother    Hypertension Mother    Thyroid  disease Mother    Hypertension Father    Stroke Father    Colon cancer Neg Hx    Social History   Socioeconomic History   Marital status: Divorced    Spouse name: Not on file   Number of children: 2   Years of education: Not on file   Highest education level: 11th grade  Occupational History   Not on file  Tobacco Use   Smoking status: Every Day    Current packs/day: 0.50    Average packs/day: 0.5 packs/day for 50.6 years (25.3 ttl pk-yrs)    Types: Cigarettes    Start date: 49   Smokeless tobacco: Never   Tobacco comments:    Working on quitting  Substance and Sexual Activity   Alcohol use: No   Drug use: No   Sexual activity: Not Currently  Other Topics Concern   Not on file  Social History Narrative   2 grown children   Son [name] in [place]    Daughter Delon in [place] with daughter Lauraine       Enjoys: shopping, play games       Diet: not as good a she would like, eats all food groups   Caffeine: 2 cups of coffee in the mornings, coke daily   Water : 3-4 cups daily       Wear seat belt    Smoke  detectors at home   Does not use phone while driving      Social Drivers of Health   Financial Resource Strain: Low Risk  (09/25/2023)   Overall Financial Resource Strain (CARDIA)    Difficulty of Paying Living Expenses: Not hard at all  Food Insecurity: No Food Insecurity (09/25/2023)   Hunger Vital Sign    Worried About Running Out of Food in the Last Year: Never true    Ran Out of Food in the Last Year: Never true  Transportation Needs: No Transportation Needs (09/25/2023)   PRAPARE - Administrator, Civil Service (Medical): No    Lack of Transportation (Non-Medical): No  Physical Activity: Inactive (09/25/2023)  Exercise Vital Sign    Days of Exercise per Week: 0 days    Minutes of Exercise per Session: 0 min  Stress: No Stress Concern Present (09/25/2023)   [employer] of Occupational Health - Occupational Stress Questionnaire    Feeling of Stress: Not at all  Social Connections: Socially Isolated (09/25/2023)   Social Connection and Isolation Panel    Frequency of Communication with Friends and Family: More than three times a week    Frequency of Social Gatherings with Friends and Family: More than three times a week    Attends Religious Services: Never    Database administrator or Organizations: No    Attends Engineer, structural: Never    Marital Status: Divorced    Tobacco Counseling Ready to quit: Yes Counseling given: Yes Tobacco comments: Working on quitting    Clinical Intake:  Pre-visit preparation completed: Yes  Pain : No/denies pain Pain Score: 0-No pain     BMI - recorded: 24.86 Nutritional Status: BMI of 19-24  Normal Diabetes: Yes CBG done?: No (telehealth visit) Did pt. bring in CBG monitor from home?: No  Lab Results  Component Value Date   HGBA1C 6.1 08/12/2023   HGBA1C 6.8 (H) 04/19/2023   HGBA1C 6.7 04/12/2023     How often do you need to have someone help you when you read instructions, pamphlets, or other  written materials from your doctor or pharmacy?: 1 - Never  Interpreter Needed?: No  Information entered by :: Stefano ORN [facility]   Activities of Daily Living     09/25/2023   10:42 AM  In your present state of health, do you have any difficulty performing the following activities:  Hearing? 0  Vision? 0  Difficulty concentrating or making decisions? 0  Walking or climbing stairs? 1  Comment patient is in a wheelchair  Dressing or bathing? 0  Doing errands, shopping? 0  Preparing Food and eating ? N  Using the Toilet? N  In the past six months, have you accidently leaked urine? N  Do you have problems with loss of bowel control? N  Managing your Medications? N  Managing your Finances? N  Housekeeping or managing your Housekeeping? N    Patient Care Team: Zarwolo, Gloria, FNP as PCP - General (Family Medicine) Blinda Katz, DPM as Consulting Physician (Podiatry) Nida, Gebreselassie W, MD as Consulting Physician (Endocrinology)  I have updated your Care Teams any recent Medical Services you may have received from other providers in the past year.     Assessment:   This is a routine wellness examination for [place].  Hearing/Vision screen Hearing Screening - Comments:: Patient denies any hearing difficulties.   Vision Screening - Comments:: Patient is not up to date on yearly eye exams.  Resources provided so that she can find a provider and schedule and appointment for her eye exam.    Goals Addressed               This Visit's Progress     Prevent falls   On track     Patient would like to avoid falls and have her ankle be completely healed. She is working with her current doctors .       Stay Active and Independent (pt-stated)   On track     I want to remain as active, healthy, and independent as possible.        Depression Screen     09/25/2023   10:44 AM 08/19/2023  10:34 AM 11/23/2022   10:54 AM 08/24/2022   10:05 AM 06/11/2022    9:36 AM 05/25/2022     9:31 AM 10/25/2021   10:36 AM  PHQ 2/9 Scores  PHQ - 2 Score 0 0 0 0 0 0 0  PHQ- 9 Score 0  0 0  0     Fall Risk     09/25/2023   10:41 AM 08/19/2023   10:34 AM 11/23/2022   10:54 AM 08/24/2022   10:05 AM 06/11/2022    9:36 AM  Fall Risk   Falls in the past year? 0 0 0 0 0  Number falls in past yr: 0 0  0   Injury with Fall? 0 0  0   Risk for fall due to : No Fall Risks   No Fall Risks   Follow up Falls evaluation completed;Education provided;Falls prevention discussed Falls evaluation completed  Falls evaluation completed     MEDICARE RISK AT HOME:  Medicare Risk at Home Any stairs in or around the home?: No If so, are there any without handrails?: Yes Home free of loose throw rugs in walkways, pet beds, electrical cords, etc?: Yes Adequate lighting in your home to reduce risk of falls?: Yes Life alert?: No Use of a cane, walker or w/c?: Yes Grab bars in the bathroom?: Yes Shower chair or bench in shower?: Yes Elevated toilet seat or a handicapped toilet?: Yes  TIMED UP AND GO:  Was the test performed?  No  Cognitive Function: 6CIT completed        09/25/2023   10:43 AM 06/11/2022    9:37 AM 06/05/2021    9:30 AM  6CIT Screen  What Year? 0 points 0 points 0 points  What month? 0 points 0 points 0 points  What time? 0 points 0 points 0 points  Count back from 20 0 points 0 points 0 points  Months in reverse 0 points 0 points 2 points  Repeat phrase 0 points 2 points 2 points  Total Score 0 points 2 points 4 points    Immunizations Immunization History  Administered Date(s) Administered    sv, Bivalent, Protein Subunit Rsvpref,pf [place]) 12/02/2021   Fluad Quad(high Dose 65+) 10/25/2021   Influenza, Quadrivalent, Recombinant, Inj, Pf 12/06/2020   Moderna SARS-COV2 Booster Vaccination 06/09/2020   Moderna Sars-Covid-2 Vaccination 09/13/2019, 10/11/2019, 12/06/2020   Pfizer(Comirnaty)Fall Seasonal Vaccine 12 years and older 01/12/2022   Pneumococcal  Polysaccharide-23 11/03/2010, 05/27/2013, 06/23/2019   Td 08/12/2019   Tdap 08/12/2019    Screening Tests Health Maintenance  Topic Date Due   Zoster Vaccines- Shingrix (1 of 2) Never done   Lung Cancer Screening  Never done   COVID-19 Vaccine (5 - 2024-25 season) 10/14/2022   FOOT EXAM  05/25/2023   Medicare Annual Wellness (AWV)  06/11/2023   DEXA SCAN  07/13/2023   Diabetic kidney evaluation - Urine ACR  08/24/2023   INFLUENZA VACCINE  09/13/2023   OPHTHALMOLOGY EXAM  10/24/2023   HEMOGLOBIN A1C  02/11/2024   Diabetic kidney evaluation - eGFR measurement  08/18/2024   MAMMOGRAM  09/03/2024   Fecal DNA (Cologuard)  05/31/2025   DTaP/Tdap/Td (3 - Td or Tdap) 08/11/2029   Pneumococcal Vaccine: 50+ Years  Completed   Hepatitis C Screening  Completed   Hepatitis B Vaccines  Aged Out   HPV VACCINES  Aged Out   Meningococcal B Vaccine  Aged Out   Colonoscopy  Discontinued    Health Maintenance  Health Maintenance Due  Topic Date Due   Zoster Vaccines- Shingrix (1 of 2) Never done   Lung Cancer Screening  Never done   COVID-19 Vaccine (5 - 2024-25 season) 10/14/2022   FOOT EXAM  05/25/2023   Medicare Annual Wellness (AWV)  06/11/2023   DEXA SCAN  07/13/2023   Diabetic kidney evaluation - Urine ACR  08/24/2023   INFLUENZA VACCINE  09/13/2023   Health Maintenance Items Addressed: Dexa ordered, referral placed for lung cancer screening, Lab Orders         Microalbumin / creatinine urine ratio      Additional Screening:  Vision Screening: Recommended annual ophthalmology exams for early detection of glaucoma and other disorders of the eye. Would you like a referral to an eye doctor? Resources provided for eye doctors and patient is aware to call and ask the providers if they accept her insurance and if they are accepting new patients.    Dental Screening: Recommended annual dental exams for proper oral hygiene  Community Resource Referral / Chronic Care Management: CRR  required this visit?  No   CCM required this visit?  No   Plan:    I have personally reviewed and noted the following in the patient's chart:   Medical and social history Use of alcohol, tobacco or illicit drugs  Current medications and supplements including opioid prescriptions. Patient is not currently taking opioid prescriptions. Functional ability and status Nutritional status Physical activity Advanced directives List of other physicians Hospitalizations, surgeries, and ER visits in previous 12 months Vitals Screenings to include cognitive, depression, and falls Referrals and appointments  In addition, I have reviewed and discussed with patient certain preventive protocols, quality metrics, and best practice recommendations. A written personalized care plan for preventive services as well as general preventive health recommendations were provided to patient.   Aneudy Champlain, CMA   09/25/2023   After Visit Summary: (MyChart) Due to this being a telephonic visit, the after visit summary with patients personalized plan was offered to patient via MyChart   Notes: Nothing significant to report at this time.

## 2023-09-25 NOTE — Patient Instructions (Signed)
 Ms. Hipps , Thank you for taking time out of your busy schedule to complete your Annual Wellness Visit with me. I enjoyed our conversation and look forward to speaking with you again next year. I, as well as your care team,  appreciate your ongoing commitment to your health goals. Please review the following plan we discussed and let me know if I can assist you in the future. Your Game plan/ To Do List    Referrals: If you haven't heard from the office you've been referred to, please reach out to them at the phone provided.  Osteoporosis Screening/Yearly Mammogram: Please call the number below to schedule your appt. [facility] Phone: [phone]  Lung Cancer Screening-[place] [facility] [address]-First Floor Medical Building directly across from [facility] Phone Number:[phone]   Follow up Visits: We will see or speak with you next year for your Next Medicare AWV with our clinical staff September 28, 2024 at 11:20 video visit. I will send you a link to your cell phone at the time of the visit for you to join the visit  Clinician Recommendations:  Aim for 30 minutes of exercise or brisk walking, 6-8 glasses of water , and 5 servings of fruits and vegetables each day.    Wishing you many blessings and good health during the next year until our next visit.  -Jontavius Rabalais   This is a list of the screenings recommended for you:  Health Maintenance  Topic Date Due   Zoster (Shingles) Vaccine (1 of 2) Never done   Screening for Lung Cancer  Never done   COVID-19 Vaccine (5 - 2024-25 season) 10/14/2022   Complete foot exam   05/25/2023   Medicare Annual Wellness Visit  06/11/2023   DEXA scan (bone density measurement)  07/13/2023   Yearly kidney health urinalysis for diabetes  08/24/2023   Flu Shot  09/13/2023   Eye exam for diabetics  10/24/2023   Hemoglobin A1C  02/11/2024   Yearly kidney function blood test for diabetes  08/18/2024   Mammogram  09/03/2024    Cologuard (Stool DNA test)  05/31/2025   DTaP/Tdap/Td vaccine (3 - Td or Tdap) 08/11/2029   Pneumococcal Vaccine for age over 55  Completed   Hepatitis C Screening  Completed   Hepatitis B Vaccine  Aged Out   HPV Vaccine  Aged Out   Meningitis B Vaccine  Aged Out   Colon Cancer Screening  Discontinued    Advanced directives: (Declined) Advance directive discussed with you today. Even though you declined this today, please call our office should you change your mind, and we can give you the proper paperwork for you to fill out. Advance Care Planning is important because it:  [x]  Makes sure you receive the medical care that is consistent with your values, goals, and preferences  [x]  It provides guidance to your family and loved ones and reduces their decisional burden about whether or not they are making the right decisions based on your wishes.  Follow the link provided in your after visit summary or read over the paperwork we have mailed to you to help you started getting your Advance Directives in place. If you need assistance in completing these, please reach out to us  so that we can help you!  See attachments for Preventive Care and Fall Prevention Tips.

## 2023-10-08 ENCOUNTER — Ambulatory Visit (HOSPITAL_COMMUNITY)
Admission: RE | Admit: 2023-10-08 | Discharge: 2023-10-08 | Disposition: A | Discharge status: Home / Self Care | Source: Ambulatory Visit | Attending: Family Medicine | Admitting: Family Medicine

## 2023-10-08 DIAGNOSIS — Z780 Asymptomatic menopausal state: Secondary | ICD-10-CM | POA: Diagnosis not present

## 2023-10-08 DIAGNOSIS — M8589 Other specified disorders of bone density and structure, multiple sites: Secondary | ICD-10-CM | POA: Diagnosis not present

## 2023-10-09 ENCOUNTER — Ambulatory Visit: Payer: Self-pay | Admitting: Family Medicine

## 2023-10-15 DIAGNOSIS — L97512 Non-pressure chronic ulcer of other part of right foot with fat layer exposed: Secondary | ICD-10-CM | POA: Diagnosis not present

## 2023-10-15 DIAGNOSIS — L97322 Non-pressure chronic ulcer of left ankle with fat layer exposed: Secondary | ICD-10-CM | POA: Diagnosis not present

## 2023-10-22 ENCOUNTER — Ambulatory Visit: Admitting: Internal Medicine

## 2023-10-25 ENCOUNTER — Other Ambulatory Visit: Payer: Self-pay | Admitting: "Endocrinology

## 2023-10-29 DIAGNOSIS — B353 Tinea pedis: Secondary | ICD-10-CM | POA: Diagnosis not present

## 2023-10-29 DIAGNOSIS — L97322 Non-pressure chronic ulcer of left ankle with fat layer exposed: Secondary | ICD-10-CM | POA: Diagnosis not present

## 2023-10-29 DIAGNOSIS — L97512 Non-pressure chronic ulcer of other part of right foot with fat layer exposed: Secondary | ICD-10-CM | POA: Diagnosis not present

## 2023-11-21 ENCOUNTER — Other Ambulatory Visit: Payer: Self-pay | Admitting: Family Medicine

## 2023-11-21 DIAGNOSIS — I10 Essential (primary) hypertension: Secondary | ICD-10-CM

## 2023-11-21 DIAGNOSIS — L97322 Non-pressure chronic ulcer of left ankle with fat layer exposed: Secondary | ICD-10-CM | POA: Diagnosis not present

## 2023-11-21 DIAGNOSIS — L97512 Non-pressure chronic ulcer of other part of right foot with fat layer exposed: Secondary | ICD-10-CM | POA: Diagnosis not present

## 2023-11-22 ENCOUNTER — Ambulatory Visit: Admitting: Internal Medicine

## 2023-11-22 ENCOUNTER — Ambulatory Visit (INDEPENDENT_AMBULATORY_CARE_PROVIDER_SITE_OTHER): Payer: Self-pay

## 2023-11-22 ENCOUNTER — Encounter: Payer: Self-pay | Admitting: Internal Medicine

## 2023-11-22 VITALS — BP 134/81 | HR 103 | Ht 66.0 in | Wt 163.8 lb

## 2023-11-22 DIAGNOSIS — Z23 Encounter for immunization: Secondary | ICD-10-CM | POA: Diagnosis not present

## 2023-11-22 DIAGNOSIS — K089 Disorder of teeth and supporting structures, unspecified: Secondary | ICD-10-CM

## 2023-11-22 DIAGNOSIS — F17210 Nicotine dependence, cigarettes, uncomplicated: Secondary | ICD-10-CM

## 2023-11-22 NOTE — Patient Instructions (Addendum)
 My office will be contacting you by phone for referral to lung cancer screening   [phone]) at [facility] - if you don't hear back from my office within one week,  please call us  back or notify us  thru MyChart and we'll address it right away.    The key is to stop smoking completely before smoking completely stops you!

## 2023-11-22 NOTE — Progress Notes (Signed)
 Kim Kim, female    DOB: 04-19-1955    MRN: [id number]   Brief patient profile:  68   yowf  active smoker  referred to pulmonary clinic in [place]  11/22/2023 by Kim Smolder NP  for ? Need for LDSCT    Pt not previously seen by PCCM service.     History of Present Illness  11/22/2023  Pulmonary/ 1st office eval/ Kim Kim / [place] [facility]  Chief Complaint  Patient presents with   Establish Care    LCS      Dyspnea:  strength in legs limits her /  Cough: minimal  Sleep: able to flat bed one pillow no am cough  SABA use: none  02: none  LDSCT: referred 11/22/2023   No obvious day to day or daytime pattern/variability or assoc excess/ purulent sputum or mucus plugs or hemoptysis or cp or chest tightness, subjective wheeze or overt sinus or hb symptoms.    Also denies any obvious fluctuation of symptoms with weather or environmental changes or other aggravating or alleviating factors except as outlined above   No unusual exposure hx or h/o childhood pna/ asthma or knowledge of premature birth.  Current Allergies, Complete Past Medical History, Past Surgical History, Family History, and Social History were reviewed in Kim Kim record.  ROS  The following are not active complaints unless bolded Hoarseness, sore throat, dysphagia, dental problems, itching, sneezing,  nasal congestion or discharge of excess mucus or purulent secretions, ear ache,   fever, chills, sweats, unintended wt loss or wt gain, classically pleuritic or exertional cp,  orthopnea pnd or arm/hand swelling  or leg swelling, presyncope, palpitations, abdominal pain, anorexia, nausea, vomiting, diarrhea  or change in bowel habits or change in bladder habits, change in stools or change in urine, dysuria, hematuria,  rash, arthralgias, visual complaints, headache, numbness, weakness or ataxia or problems with walking or coordination,  change in mood or  memory.            Outpatient  Medications Prior to Visit  Medication Sig Dispense Refill   Accu-Chek Softclix Lancets lancets Use to test BG twice daily 200 each 2   acetaminophen  (TYLENOL ) 500 MG tablet Take 500 mg by mouth every 6 (six) hours as needed. Pain.     atorvastatin  (LIPITOR) 40 MG tablet TAKE ONE TABLET BY MOUTH ONCE DAILY 90 tablet 3   Blood Glucose Monitoring Suppl (ACCU-CHEK GUIDE) w/Device KIT Use to check BG twice daily 1 kit 0   Cholecalciferol  50 MCG (2000 UT) CAPS Take 1 capsule (2,000 Units total) by mouth daily with breakfast. 90 capsule 1   FARXIGA  10 MG TABS tablet TAKE 1 TABLET(10 MG) BY MOUTH DAILY BEFORE BREAKFAST 30 tablet 2   glucose blood (TRUE METRIX BLOOD GLUCOSE TEST) test strip USE TO CHECK BLOOD GLUCOSE TWICE DAILY 200 strip 2   losartan -hydrochlorothiazide  (HYZAAR) 100-25 MG tablet TAKE ONE TABLET BY MOUTH ONCE DAILY 90 tablet 1   metFORMIN  (GLUCOPHAGE ) 1000 MG tablet TAKE 1 TABLET BY MOUTH TWICE DAILY WITH MEALS 180 tablet 0   omeprazole  (PRILOSEC) 20 MG capsule TAKE 1 CAPSULE(20 MG) BY MOUTH DAILY AS NEEDED FOR HEARTBURN (Patient taking differently: as needed.) 30 capsule 3   Semaglutide ,0.25 or 0.5MG /DOS, (OZEMPIC , 0.25 OR 0.5 MG/DOSE,) 2 MG/3ML SOPN Inject 0.25 mg into the skin once a week. 9 mL 1   silver sulfADIAZINE (SILVADENE) 1 % cream Apply 1 application  topically 2 (two) times daily as needed.  collagenase  (SANTYL ) ointment Apply 1 application topically daily. (Patient not taking: Reported on 11/22/2023) 30 g 2   No facility-administered medications prior to visit.    Past Medical History:  Diagnosis Date   Cancer [facility])    history of leukemia   Community acquired pneumonia 05/26/2013   Diabetes mellitus without complication (HCC)    Encounter for screening mammogram for malignant neoplasm of breast 03/26/2019   Hypercholesteremia    Hypertension    Neuropathy    Seasonal allergies       Objective:     BP 134/81   Pulse (!) 103   Ht 5' 6 (1.676 m)   Wt 163  lb 12.8 oz (74.3 kg)   SpO2 93% Comment: ra  BMI 26.44 kg/m   SpO2: 93 % (ra) w/c bound elderly wf slt awkward speech pattern    HEENT : Oropharynx  clear/ very poor dentition      Nasal turbinates nl    NECK :  without  apparent JVD/ palpable Nodes/TM    LUNGS: no acc muscle use,  Nl contour chest which is clear to A and P bilaterally without cough on insp or exp maneuvers   CV:  RRR  no s3 or murmur or increase in P2, and no edema   ABD:  soft and nontender   MS:   ext warm without deformities Or obvious joint restrictions  calf tenderness, cyanosis or clubbing    SKIN: warm and dry without lesions    NEURO:  alert, approp, nl sensorium with  no motor or cerebellar deficits apparent.         Assessment   Assessment & Plan Cigarette smoker Active smoker with > 20 pk y h/o 1st pulmonary eval 68/11/2023   >>> not limited by doe or having aecopd so no need for pfts or rx   >>> Counseled re importance of smoking cessation but did not meet time criteria for separate billing    Low-dose CT lung cancer screening is recommended for patients who are 69-31 years of age with a 20+ pack-year history of smoking and who are currently smoking or quit <=15 years ago. No coughing up blood  No unintentional weight loss of > 15 pounds in the last 6 months - pt is eligible for scanning yearly until age 69 > referred today   Pulmonary clinic f/u can be prn   Discussed in detail all the  indications, usual  risks and alternatives  relative to the benefits with patient who agrees to proceed with w/u as outlined.      Poor dentition Strongly rec dental  11/22/2023 >>>   Advised on risk of pulmonary infection at present level of dental dz         Each maintenance medication was reviewed in detail including emphasizing most importantly the difference between maintenance and prns and under what circumstances the prns are to be triggered    Total time for H and P, chart review,  counseling,   and generating customized AVS unique to this office visit / same day charting = 40 min new pt eval          AVS  Patient Instructions  My office will be contacting you by phone for referral to lung cancer screening   [phone]) at [facility] - if you don't hear back from my office within one week,  please call us  back or notify us  thru MyChart and we'll address it right away.  The key is to stop smoking completely before smoking completely stops you!              Ozell America, MD 11/22/2023

## 2023-11-22 NOTE — Assessment & Plan Note (Addendum)
 Strongly rec dental  11/22/2023 >>>   Advised on risk of pulmonary infection at present level of dental dz         Each maintenance medication was reviewed in detail including emphasizing most importantly the difference between maintenance and prns and under what circumstances the prns are to be triggered    Total time for H and P, chart review, counseling,   and generating customized AVS unique to this office visit / same day charting = 40 min new pt eval

## 2023-11-22 NOTE — Assessment & Plan Note (Addendum)
 Active smoker with > 20 pk y h/o 1st pulmonary eval 11/22/2023   >>> not limited by doe or having aecopd so no need for pfts or rx   >>> Counseled re importance of smoking cessation but did not meet time criteria for separate billing    Low-dose CT lung cancer screening is recommended for patients who are 38-69 years of age with a 20+ pack-year history of smoking and who are currently smoking or quit <=15 years ago. No coughing up blood  No unintentional weight loss of > 15 pounds in the last 6 months - pt is eligible for scanning yearly until age 77 > referred today   Pulmonary clinic f/u can be prn   Discussed in detail all the  indications, usual  risks and alternatives  relative to the benefits with patient who agrees to proceed with w/u as outlined.

## 2023-11-28 DIAGNOSIS — L97512 Non-pressure chronic ulcer of other part of right foot with fat layer exposed: Secondary | ICD-10-CM | POA: Diagnosis not present

## 2023-11-28 DIAGNOSIS — L97322 Non-pressure chronic ulcer of left ankle with fat layer exposed: Secondary | ICD-10-CM | POA: Diagnosis not present

## 2023-12-03 ENCOUNTER — Other Ambulatory Visit: Payer: Self-pay | Admitting: Family Medicine

## 2023-12-03 DIAGNOSIS — E1165 Type 2 diabetes mellitus with hyperglycemia: Secondary | ICD-10-CM

## 2023-12-12 DIAGNOSIS — L97322 Non-pressure chronic ulcer of left ankle with fat layer exposed: Secondary | ICD-10-CM | POA: Diagnosis not present

## 2023-12-12 DIAGNOSIS — L97512 Non-pressure chronic ulcer of other part of right foot with fat layer exposed: Secondary | ICD-10-CM | POA: Diagnosis not present

## 2023-12-19 ENCOUNTER — Ambulatory Visit (INDEPENDENT_AMBULATORY_CARE_PROVIDER_SITE_OTHER): Admitting: "Endocrinology

## 2023-12-19 ENCOUNTER — Encounter: Payer: Self-pay | Admitting: "Endocrinology

## 2023-12-19 VITALS — BP 100/68 | HR 100 | Ht 66.0 in | Wt 165.0 lb

## 2023-12-19 DIAGNOSIS — Z7984 Long term (current) use of oral hypoglycemic drugs: Secondary | ICD-10-CM

## 2023-12-19 DIAGNOSIS — E1165 Type 2 diabetes mellitus with hyperglycemia: Secondary | ICD-10-CM

## 2023-12-19 DIAGNOSIS — E782 Mixed hyperlipidemia: Secondary | ICD-10-CM | POA: Diagnosis not present

## 2023-12-19 DIAGNOSIS — I10 Essential (primary) hypertension: Secondary | ICD-10-CM

## 2023-12-19 LAB — POCT GLYCOSYLATED HEMOGLOBIN (HGB A1C): HbA1c, POC (controlled diabetic range): 7.2 % — AB (ref 0.0–7.0)

## 2023-12-19 MED ORDER — OZEMPIC (0.25 OR 0.5 MG/DOSE) 2 MG/3ML ~~LOC~~ SOPN
0.5000 mg | PEN_INJECTOR | SUBCUTANEOUS | 1 refills | Status: AC
Start: 2023-12-19 — End: ?

## 2023-12-19 NOTE — Patient Instructions (Signed)

## 2023-12-19 NOTE — Progress Notes (Signed)
 12/19/2023, 12:50 PM   Endocrinology follow-up note  Subjective:    Patient ID: Kim [name], female    DOB: [birth year].  PORSCHA [name] is being seen in follow-up after she was seen in consultation for management of currently uncontrolled symptomatic diabetes requested by  Edman Meade PEDLAR, FNP.   Past Medical History:  Diagnosis Date   Cancer [facility])    history of leukemia   Community acquired pneumonia 05/26/2013   Diabetes mellitus without complication (HCC)    Encounter for screening mammogram for malignant neoplasm of breast 03/26/2019   Hypercholesteremia    Hypertension    Neuropathy    Seasonal allergies     Past Surgical History:  Procedure Laterality Date   COLONOSCOPY  12/28/2011   Procedure: COLONOSCOPY;  Surgeon: Margo LITTIE Haddock, MD;  Location: [facility];  Service: Endoscopy;  Laterality: N/A;  9:30 AM-changed to 8:30 [name] to notify pt   FOOT SURGERY Left    LAPAROSCOPY     TUBAL LIGATION     TUNNELED VENOUS CATHETER PLACEMENT      Social History   Socioeconomic History   Marital status: Divorced    Spouse name: Not on file   Number of children: 2   Years of education: Not on file   Highest education level: 11th grade  Occupational History   Not on file  Tobacco Use   Smoking status: Every Day    Current packs/day: 0.50    Average packs/day: 0.5 packs/day for 50.8 years (25.4 ttl pk-yrs)    Types: Cigarettes    Start date: 29   Smokeless tobacco: Never   Tobacco comments:    Working on quitting  Substance and Sexual Activity   Alcohol use: No   Drug use: No   Sexual activity: Not Currently  Other Topics Concern   Not on file  Social History Narrative   2 grown children   Son Kim [name] in [place]    Daughter Kim [name] in [place] with daughter Lauraine       Enjoys: shopping, play games       Diet: not as good a she would like, eats  all food groups   Caffeine: 2 cups of coffee in the mornings, coke daily   Water : 3-4 cups daily       Wear seat belt    Smoke detectors at home   Does not use phone while driving      Social Drivers of Health   Financial Resource Strain: Low Risk  (09/25/2023)   Overall Financial Resource Strain (CARDIA)    Difficulty of Paying Living Expenses: Not hard at all  Food Insecurity: No Food Insecurity (09/25/2023)   Hunger Vital Sign    Worried About Running Out of Food in the Last Year: Never true    Ran Out of Food in the Last Year: Never true  Transportation Needs: No Transportation Needs (09/25/2023)   PRAPARE - Administrator, Civil Service (Medical): No    Lack of Transportation (Non-Medical): No  Physical Activity: Inactive (09/25/2023)   Exercise Vital  Sign    Days of Exercise per Week: 0 days    Minutes of Exercise per Session: 0 min  Stress: No Stress Concern Present (09/25/2023)   [employer] of Occupational Health - Occupational Stress Questionnaire    Feeling of Stress: Not at all  Social Connections: Socially Isolated (09/25/2023)   Social Connection and Isolation Panel    Frequency of Communication with Friends and Family: More than three times a week    Frequency of Social Gatherings with Friends and Family: More than three times a week    Attends Religious Services: Never    Database Administrator or Organizations: No    Attends Engineer, Structural: Never    Marital Status: Divorced    Family History  Problem Relation Age of Onset   Hypothyroidism Mother    Diabetes Mother    Hypertension Mother    Thyroid  disease Mother    Hypertension Father    Stroke Father    Colon cancer Neg Hx     Outpatient Encounter Medications as of 12/19/2023  Medication Sig   Semaglutide ,0.25 or 0.5MG /DOS, (OZEMPIC , 0.25 OR 0.5 MG/DOSE,) 2 MG/3ML SOPN Inject 0.5 mg into the skin once a week.   Accu-Chek Softclix Lancets lancets Use to test BG twice daily    acetaminophen  (TYLENOL ) 500 MG tablet Take 500 mg by mouth every 6 (six) hours as needed. Pain.   atorvastatin  (LIPITOR) 40 MG tablet TAKE ONE TABLET BY MOUTH ONCE DAILY   Blood Glucose Monitoring Suppl (ACCU-CHEK GUIDE) w/Device KIT Use to check BG twice daily   Cholecalciferol  50 MCG (2000 UT) CAPS Take 1 capsule (2,000 Units total) by mouth daily with breakfast.   collagenase  (SANTYL ) ointment Apply 1 application topically daily. (Patient not taking: Reported on 11/22/2023)   FARXIGA  10 MG TABS tablet TAKE 1 TABLET(10 MG) BY MOUTH DAILY BEFORE BREAKFAST   glucose blood (TRUE METRIX BLOOD GLUCOSE TEST) test strip USE TO CHECK BLOOD GLUCOSE TWICE DAILY   losartan -hydrochlorothiazide  (HYZAAR) 100-25 MG tablet TAKE ONE TABLET BY MOUTH ONCE DAILY   metFORMIN  (GLUCOPHAGE ) 1000 MG tablet TAKE 1 TABLET BY MOUTH TWICE DAILY WITH MEALS   omeprazole  (PRILOSEC) 20 MG capsule TAKE 1 CAPSULE(20 MG) BY MOUTH DAILY AS NEEDED FOR HEARTBURN (Patient taking differently: as needed.)   silver sulfADIAZINE (SILVADENE) 1 % cream Apply 1 application  topically 2 (two) times daily as needed.   [DISCONTINUED] Semaglutide ,0.25 or 0.5MG /DOS, (OZEMPIC , 0.25 OR 0.5 MG/DOSE,) 2 MG/3ML SOPN Inject 0.25 mg into the skin once a week.   No facility-administered encounter medications on file as of 12/19/2023.    ALLERGIES: Allergies  Allergen Reactions   Sulfamethoxazole -Trimethoprim  Itching and Rash    Rash all over body, itching    VACCINATION STATUS: Immunization History  Administered Date(s) Administered    sv, Bivalent, Protein Subunit Rsvpref,pf [place]) 12/02/2021   Fluad Quad(high Dose 65+) 10/25/2021   INFLUENZA, HIGH DOSE SEASONAL PF 11/22/2023   Influenza, Quadrivalent, Recombinant, Inj, Pf 12/06/2020   Moderna SARS-COV2 Booster Vaccination 06/09/2020   Moderna Sars-Covid-2 Vaccination 09/13/2019, 10/11/2019, 12/06/2020   Pfizer(Comirnaty)Fall Seasonal Vaccine 12 years and older 01/12/2022    Pneumococcal Polysaccharide-23 11/03/2010, 05/27/2013, 06/23/2019   Td 08/12/2019   Tdap 08/12/2019    Diabetes She presents for her follow-up diabetic visit. She has type 2 diabetes mellitus. Onset time: She was diagnosed at approximate age of 39 years. Her disease course has been stable. There are no hypoglycemic associated symptoms. Pertinent negatives for hypoglycemia include no confusion,  headaches, pallor or seizures. Associated symptoms include blurred vision and fatigue. Pertinent negatives for diabetes include no chest pain, no polydipsia, no polyphagia and no polyuria. There are no hypoglycemic complications. Symptoms are stable. Diabetic complications include nephropathy. Risk factors for coronary artery disease include diabetes mellitus, dyslipidemia, hypertension, family history, tobacco exposure, sedentary lifestyle and post-menopausal. Her weight is decreasing steadily. She is following a generally unhealthy diet. When asked about meal planning, she reported none. She has not had a previous visit with a dietitian. She rarely (She is wheelchair-bound due to deconditioning/disequilibrium.  ) participates in exercise. Her home blood glucose trend is fluctuating minimally. Her breakfast blood glucose range is generally 130-140 mg/dl. Her bedtime blood glucose range is generally 140-180 mg/dl. Her overall blood glucose range is 140-180 mg/dl. Sheliah presents with stable and near target glycemic profile.  Her point-of-care A1c 7.2%.  She did not document hypoglycemia.  She is tolerating low-dose Ozempic  0.25 mg subcutaneously weekly along with her metformin  and Farxiga .      ) An ACE inhibitor/angiotensin II receptor blocker is being taken. Eye exam is current.  Hyperlipidemia This is a chronic problem. The current episode started more than 1 year ago. Exacerbating diseases include diabetes. Pertinent negatives include no chest pain, myalgias or shortness of breath. Current antihyperlipidemic  treatment includes statins. Risk factors for coronary artery disease include diabetes mellitus, hypertension, dyslipidemia, family history, post-menopausal and a sedentary lifestyle.  Hypertension This is a chronic problem. The current episode started more than 1 year ago. Associated symptoms include blurred vision. Pertinent negatives include no chest pain, headaches, palpitations or shortness of breath. Risk factors for coronary artery disease include diabetes mellitus, dyslipidemia, family history, post-menopausal state, smoking/tobacco exposure and sedentary lifestyle.     Objective:       12/19/2023   11:38 AM 11/22/2023    9:35 AM 09/25/2023   10:31 AM  Vitals with BMI  Height 5' 6 5' 6 5' 6  Weight 165 lbs 163 lbs 13 oz 154 lbs  BMI 26.64 26.45 24.87  Systolic 100 134 --  Diastolic 68 81 --  Pulse 100 103     BP 100/68   Pulse 100   Ht 5' 6 (1.676 m)   Wt 165 lb (74.8 kg)   BMI 26.63 kg/m   Wt Readings from Last 3 Encounters:  12/19/23 165 lb (74.8 kg)  11/22/23 163 lb 12.8 oz (74.3 kg)  09/25/23 154 lb (69.9 kg)      CMP ( most recent) CMP     Component Value Date/Time   NA 142 08/19/2023 1111   K 4.5 08/19/2023 1111   CL 101 08/19/2023 1111   CO2 24 08/19/2023 1111   GLUCOSE 114 (H) 08/19/2023 1111   GLUCOSE 228 (H) 07/21/2021 0329   BUN 29 (H) 08/19/2023 1111   CREATININE 1.04 (H) 08/19/2023 1111   CREATININE 0.95 07/21/2021 0329   CALCIUM  9.5 08/19/2023 1111   PROT 6.7 08/19/2023 1111   ALBUMIN 4.2 08/19/2023 1111   AST 19 08/19/2023 1111   ALT 18 08/19/2023 1111   ALKPHOS 128 (H) 08/19/2023 1111   BILITOT 0.4 08/19/2023 1111   GFRNONAA 34 (L) 02/07/2021 1745   GFRNONAA 65 03/26/2019 1517   GFRAA 68 12/02/2019 1203   GFRAA 75 03/26/2019 1517    Diabetic Labs (most recent): Lab Results  Component Value Date   HGBA1C 7.2 (A) 12/19/2023   HGBA1C 6.1 08/12/2023   HGBA1C 6.8 (H) 04/19/2023    Lipid Panel  Component Value Date/Time    CHOL 120 08/19/2023 1111   TRIG 103 08/19/2023 1111   HDL 42 08/19/2023 1111   CHOLHDL 2.9 08/19/2023 1111   CHOLHDL 4.3 03/26/2019 1517   LDLCALC 59 08/19/2023 1111   LDLCALC 122 (H) 03/26/2019 1517   LABVLDL 19 08/19/2023 1111      Latest Reference Range & Units 05/25/22 10:00 05/25/22 10:20  TSH 0.450 - 4.500 uIU/mL 4.130 4.170  T4,Free(Direct) 0.82 - 1.77 ng/dL 8.62 8.71   Assessment & Plan:   1. Uncontrolled type 2 diabetes mellitus with hyperglycemia (HCC)  - LANAIYA LANTRY has currently uncontrolled symptomatic type 2 DM since  68 years of age.   She presents her log which shows significantly improved glycemic profile to near target levels.    Xiomara presents with stable and near target glycemic profile.  Her point-of-care A1c 7.2%.  She did not document hypoglycemia.  She is tolerating low-dose Ozempic  0.25 mg subcutaneously weekly along with her metformin  and Farxiga .     Recent labs reviewed. - I had a long discussion with her about the progressive nature of diabetes and the pathology behind its complications. -her diabetes is complicated by CKD, sedentary life, smoking and she remains at a high risk for more acute and chronic complications which include CAD, CVA, retinopathy, and neuropathy. These are all discussed in detail with her.  - I have counseled her on diet  and weight management  by adopting a carbohydrate restricted/protein rich diet. Patient is encouraged to switch to  unprocessed or minimally processed     complex starch and increased protein intake (animal or plant source), fruits, and vegetables. -  she is advised to stick to a routine mealtimes to eat 3 meals  a day and avoid unnecessary snacks ( to snack only to correct hypoglycemia).    - she acknowledges that there is a room for improvement in her food and drink choices. - Suggestion is made for her to avoid simple carbohydrates  from her diet including Cakes, Sweet Desserts, Ice Cream, Soda (diet and  regular), Sweet Tea, Candies, Chips, Cookies, Store Bought Juices, Alcohol , Artificial Sweeteners,  Coffee Creamer, and Sugar-free Products, Lemonade. This will help patient to have more stable blood glucose profile and potentially avoid unintended weight gain.  - I have approached her with the following individualized plan to manage  her diabetes and patient agrees:    - In light of her continued smoking putting her at risk for pancreatitis, she is advised to adjust Ozempic  slowly, may increase to 0.5 mg subcutaneously weekly.   She is advised to call clinic if she develops side effects including nausea, vomiting, diarrhea.  She is advised to continue Farxiga  10 mg daily at breakfast and metformin  1000 mg p.o. twice daily.   -  she is encouraged to call clinic for blood glucose levels less than 70 or greater than 200 mg/Dl at fasting.  - Specific targets for  A1c;  LDL, HDL,  and Triglycerides were discussed with the patient.  2) Blood Pressure /Hypertension:  Her blood pressure is controlled to target. she is advised to continue her current medications including losartan /HCTZ 100/25 mg p.o. daily with breakfast . The patient was counseled on the dangers of tobacco use, and was advised to quit.  Reviewed strategies to maximize success, including removing cigarettes and smoking materials from environment.    3) Lipids/Hyperlipidemia:   Review of her recent lipid panel showed controlled LDL at 64.  She is  advised to continue atorvastatin  40 mg p.o. nightly.  Precautions discussed with her.      4)  Weight/Diet:  Body mass index is 26.63 kg/m.  -She is a candidate for some  weight loss.   I discussed with her the fact that loss of 5 - 10% of her  current body weight will have the most impact on her diabetes management.  Exercise, and detailed carbohydrates information provided  -  detailed on discharge instructions.  5) Chronic Care/Health Maintenance:  -she  is on ACEI/ARB and Statin  medications and  is encouraged to initiate and continue to follow up with Ophthalmology, Dentist,  Podiatrist at least yearly or according to recommendations, and advised to  Quit smoking. I have recommended yearly flu vaccine and pneumonia vaccine at least every 5 years;  and  sleep for at least 7 hours a day.  She is wheelchair-bound, cannot exercise optimally.   6) subclinical hypothyroidism  -She did have high antithyroid antibodies.  However, she continues to have euthyroid state based on her current thyroid  function test., She will not need intervention with thyroid  hormone at this time.   - she is  advised to maintain close follow up with Bacchus, Meade PEDLAR, FNP for primary care needs, as well as her other providers for optimal and coordinated care.   I spent  25  minutes in the care of the patient today including review of labs from CMP, Lipids, Thyroid  Function, Hematology (current and previous including abstractions from other facilities); face-to-face time discussing  her blood glucose readings/logs, discussing hypoglycemia and hyperglycemia episodes and symptoms, medications doses, her options of short and long term treatment based on the latest standards of care / guidelines;  discussion about incorporating lifestyle medicine;  and documenting the encounter. Risk reduction counseling performed per USPSTF guidelines to reduce cardiovascular risk factors.     Please refer to Patient Instructions for Blood Glucose Monitoring and Insulin /Medications Dosing Guide  in media tab for additional information. Please  also refer to  Patient Self Inventory in the Media  tab for reviewed elements of pertinent patient history.  [name] participated in the discussions, expressed understanding, and voiced agreement with the above plans.  All questions were answered to her satisfaction. she is encouraged to contact clinic should she have any questions or concerns prior to her return  visit.    Follow up plan: - Return in about 4 months (around 04/17/2024) for F/U with Pre-visit Labs, Meter/CGM/Logs, A1c here.  Ranny Earl, MD [facility] [address] [place], [place] [address] Phone: [phone]  Fax: [phone]    12/19/2023, 12:50 PM  This note was partially dictated with voice recognition software. Similar sounding words can be transcribed inadequately or may not  be corrected upon review.

## 2023-12-20 ENCOUNTER — Encounter: Payer: Self-pay | Admitting: Family Medicine

## 2023-12-20 ENCOUNTER — Ambulatory Visit: Admitting: Family Medicine

## 2023-12-20 VITALS — BP 116/77 | HR 99 | Ht 65.0 in | Wt 164.0 lb

## 2023-12-20 DIAGNOSIS — I10 Essential (primary) hypertension: Secondary | ICD-10-CM

## 2023-12-20 DIAGNOSIS — E1165 Type 2 diabetes mellitus with hyperglycemia: Secondary | ICD-10-CM

## 2023-12-20 DIAGNOSIS — Z23 Encounter for immunization: Secondary | ICD-10-CM | POA: Diagnosis not present

## 2023-12-20 NOTE — Patient Instructions (Addendum)
 I appreciate the opportunity to provide care to you today!    Follow up:  4 months  Labs: please stop by the lab today to get your blood drawn (CBC, CMP, TSH, Lipid profile, HgA1c, Vit D)  For a Healthier YOU, I Recommend: Reducing your intake of sugar, sodium, carbohydrates, and saturated fats. Increasing your fiber intake by incorporating more whole grains, fruits, and vegetables into your meals. Setting healthy goals with a focus on lowering your consumption of carbs, sugar, and unhealthy fats. Adding variety to your diet by including a wide range of fruits and vegetables. Cutting back on soda and limiting processed foods as much as possible. Staying active: In addition to taking your weight loss medication, aim for at least 150 minutes of moderate-intensity physical activity each week for optimal results.    Please follow up if your symptoms worsen or fail to improve.    Please continue to a heart-healthy diet and increase your physical activities. Try to exercise for at least five days a week.    It was a pleasure to see you and I look forward to continuing to work together on your health and well-being. Please do not hesitate to call the office if you need care or have questions about your care.  In case of emergency, please visit the Emergency Department for urgent care, or contact our clinic at [phone] to schedule an appointment. We're here to help you!   Have a wonderful day and week. With Gratitude, Meade JENEANE Gerlach MSN, FNP-BC, PMHNP-BC

## 2023-12-20 NOTE — Progress Notes (Signed)
 Established Patient Office Visit  Subjective:  Patient ID: Kim [name], female    DOB: 1955/03/16  Age: 68 y.o. MRN: [id number]  CC:  Chief Complaint  Patient presents with   Hypertension    Four month follow up    HPI Kim [name] is a 68 y.o. female with past medical history of HTN, GERD, T2DM presents for f/u of  chronic medical conditions.  For the details of today's visit, please refer to the assessment and plan.     Past Medical History:  Diagnosis Date   Cancer [facility])    history of leukemia   Community acquired pneumonia 05/26/2013   Diabetes mellitus without complication (HCC)    Encounter for screening mammogram for malignant neoplasm of breast 03/26/2019   Hypercholesteremia    Hypertension    Neuropathy    Seasonal allergies     Past Surgical History:  Procedure Laterality Date   COLONOSCOPY  12/28/2011   Procedure: COLONOSCOPY;  Surgeon: Margo LITTIE Haddock, MD;  Location: [facility];  Service: Endoscopy;  Laterality: N/A;  9:30 AM-changed to 8:30 [name] to notify pt   FOOT SURGERY Left    LAPAROSCOPY     TUBAL LIGATION     TUNNELED VENOUS CATHETER PLACEMENT      Family History  Problem Relation Age of Onset   Hypothyroidism Mother    Diabetes Mother    Hypertension Mother    Thyroid  disease Mother    Hypertension Father    Stroke Father    Colon cancer Neg Hx     Social History   Socioeconomic History   Marital status: Divorced    Spouse name: Not on file   Number of children: 2   Years of education: Not on file   Highest education level: 11th grade  Occupational History   Not on file  Tobacco Use   Smoking status: Every Day    Current packs/day: 0.50    Average packs/day: 0.5 packs/day for 50.9 years (25.4 ttl pk-yrs)    Types: Cigarettes    Start date: 56   Smokeless tobacco: Never   Tobacco comments:    Working on quitting  Substance and Sexual Activity   Alcohol use: No   Drug use: No   Sexual activity: Not Currently  Other  Topics Concern   Not on file  Social History Narrative   2 grown children   Son [name] in [place]    Daughter Delon in [place] with daughter Lauraine       Enjoys: shopping, play games       Diet: not as good a she would like, eats all food groups   Caffeine: 2 cups of coffee in the mornings, coke daily   Water : 3-4 cups daily       Wear seat belt    Smoke detectors at home   Does not use phone while driving      Social Drivers of Health   Financial Resource Strain: Low Risk  (09/25/2023)   Overall Financial Resource Strain (CARDIA)    Difficulty of Paying Living Expenses: Not hard at all  Food Insecurity: No Food Insecurity (09/25/2023)   Hunger Vital Sign    Worried About Running Out of Food in the Last Year: Never true    Ran Out of Food in the Last Year: Never true  Transportation Needs: No Transportation Needs (09/25/2023)   PRAPARE - Administrator, Civil Service (Medical): No  Lack of Transportation (Non-Medical): No  Physical Activity: Inactive (09/25/2023)   Exercise Vital Sign    Days of Exercise per Week: 0 days    Minutes of Exercise per Session: 0 min  Stress: No Stress Concern Present (09/25/2023)   [employer] of Occupational Health - Occupational Stress Questionnaire    Feeling of Stress: Not at all  Social Connections: Socially Isolated (09/25/2023)   Social Connection and Isolation Panel    Frequency of Communication with Friends and Family: More than three times a week    Frequency of Social Gatherings with Friends and Family: More than three times a week    Attends Religious Services: Never    Database Administrator or Organizations: No    Attends Banker Meetings: Never    Marital Status: Divorced  Catering Manager Violence: Not At Risk (09/25/2023)   Humiliation, Afraid, Rape, and Kick questionnaire    Fear of Current or Ex-Partner: No    Emotionally Abused: No    Physically Abused: No    Sexually  Abused: No    Outpatient Medications Prior to Visit  Medication Sig Dispense Refill   Accu-Chek Softclix Lancets lancets Use to test BG twice daily 200 each 2   acetaminophen  (TYLENOL ) 500 MG tablet Take 500 mg by mouth every 6 (six) hours as needed. Pain.     atorvastatin  (LIPITOR) 40 MG tablet TAKE ONE TABLET BY MOUTH ONCE DAILY 90 tablet 3   Blood Glucose Monitoring Suppl (ACCU-CHEK GUIDE) w/Device KIT Use to check BG twice daily 1 kit 0   Cholecalciferol  50 MCG (2000 UT) CAPS Take 1 capsule (2,000 Units total) by mouth daily with breakfast. 90 capsule 1   collagenase  (SANTYL ) ointment Apply 1 application topically daily. (Patient not taking: Reported on 11/22/2023) 30 g 2   FARXIGA  10 MG TABS tablet TAKE 1 TABLET(10 MG) BY MOUTH DAILY BEFORE BREAKFAST 30 tablet 2   glucose blood (TRUE METRIX BLOOD GLUCOSE TEST) test strip USE TO CHECK BLOOD GLUCOSE TWICE DAILY 200 strip 2   losartan -hydrochlorothiazide  (HYZAAR) 100-25 MG tablet TAKE ONE TABLET BY MOUTH ONCE DAILY 90 tablet 1   metFORMIN  (GLUCOPHAGE ) 1000 MG tablet TAKE 1 TABLET BY MOUTH TWICE DAILY WITH MEALS 180 tablet 0   omeprazole  (PRILOSEC) 20 MG capsule TAKE 1 CAPSULE(20 MG) BY MOUTH DAILY AS NEEDED FOR HEARTBURN (Patient taking differently: as needed.) 30 capsule 3   Semaglutide ,0.25 or 0.5MG /DOS, (OZEMPIC , 0.25 OR 0.5 MG/DOSE,) 2 MG/3ML SOPN Inject 0.5 mg into the skin once a week. 6 mL 1   silver sulfADIAZINE (SILVADENE) 1 % cream Apply 1 application  topically 2 (two) times daily as needed.     No facility-administered medications prior to visit.    Allergies  Allergen Reactions   Sulfamethoxazole -Trimethoprim  Itching and Rash    Rash all over body, itching    ROS Review of Systems  Constitutional:  Negative for chills and fever.  Eyes:  Negative for visual disturbance.  Respiratory:  Negative for chest tightness and shortness of breath.   Neurological:  Negative for dizziness and headaches.      Objective:     Physical Exam HENT:     Head: Normocephalic.     Mouth/Throat:     Mouth: Mucous membranes are moist.  Cardiovascular:     Rate and Rhythm: Normal rate.     Heart sounds: Normal heart sounds.  Pulmonary:     Effort: Pulmonary effort is normal.     Breath sounds: Normal  breath sounds.  Neurological:     Mental Status: She is alert.     BP 116/77   Pulse 99   Ht 5' 5 (1.651 m)   Wt 164 lb (74.4 kg)   SpO2 90%   BMI 27.29 kg/m  Wt Readings from Last 3 Encounters:  12/20/23 164 lb (74.4 kg)  12/19/23 165 lb (74.8 kg)  11/22/23 163 lb 12.8 oz (74.3 kg)    Lab Results  Component Value Date   TSH 2.880 08/19/2023   Lab Results  Component Value Date   WBC 8.4 08/19/2023   HGB 14.4 08/19/2023   HCT 43.4 08/19/2023   MCV 93 08/19/2023   PLT 296 08/19/2023   Lab Results  Component Value Date   NA 142 08/19/2023   K 4.5 08/19/2023   CO2 24 08/19/2023   GLUCOSE 114 (H) 08/19/2023   BUN 29 (H) 08/19/2023   CREATININE 1.04 (H) 08/19/2023   BILITOT 0.4 08/19/2023   ALKPHOS 128 (H) 08/19/2023   AST 19 08/19/2023   ALT 18 08/19/2023   PROT 6.7 08/19/2023   ALBUMIN 4.2 08/19/2023   CALCIUM  9.5 08/19/2023   ANIONGAP 12 02/07/2021   EGFR 58 (L) 08/19/2023   Lab Results  Component Value Date   CHOL 120 08/19/2023   Lab Results  Component Value Date   HDL 42 08/19/2023   Lab Results  Component Value Date   LDLCALC 59 08/19/2023   Lab Results  Component Value Date   TRIG 103 08/19/2023   Lab Results  Component Value Date   CHOLHDL 2.9 08/19/2023   Lab Results  Component Value Date   HGBA1C 7.2 (A) 12/19/2023      Assessment & Plan:  Uncontrolled type 2 diabetes mellitus with hyperglycemia (HCC) Assessment & Plan: Encouraged to continue taking metformin  1000 mg twice daily, ozempic  0.5 mg weekly  and Farxiga  10 mg daily. Encouraged to decrease her intake of high sugar food and beverages with increase physical activity Lab Results  Component Value  Date   HGBA1C 7.2 (A) 12/19/2023     Orders: -     Microalbumin / creatinine urine ratio -     HM Diabetes Foot Exam  Essential hypertension, benign Assessment & Plan: Controlled She takes losarta-hydrochlorothiazide  12.5 mg daily Denies headaches, dizziness, blurred vision, chest pain, palpitation Low-sodium diet with increased physical activity encouraged BP Readings from Last 3 Encounters:  12/20/23 116/77  12/19/23 100/68  11/22/23 134/81      Note: This chart has been completed using [employer] software, and while attempts have been made to ensure accuracy, certain words and phrases may not be transcribed as intended.    Follow-up: Return in about 4 months (around 04/18/2024).   Wafaa Deemer  Z Bacchus, FNP

## 2023-12-22 LAB — MICROALBUMIN / CREATININE URINE RATIO
Creatinine, Urine: 67.7 mg/dL
Microalb/Creat Ratio: 120 mg/g{creat} — ABNORMAL HIGH (ref 0–29)
Microalbumin, Urine: 81.2 ug/mL

## 2023-12-23 NOTE — Assessment & Plan Note (Signed)
 Controlled She takes losarta-hydrochlorothiazide  12.5 mg daily Denies headaches, dizziness, blurred vision, chest pain, palpitation Low-sodium diet with increased physical activity encouraged BP Readings from Last 3 Encounters:  12/20/23 116/77  12/19/23 100/68  11/22/23 134/81

## 2023-12-23 NOTE — Assessment & Plan Note (Signed)
 Encouraged to continue taking metformin  1000 mg twice daily, ozempic  0.5 mg weekly  and Farxiga  10 mg daily. Encouraged to decrease her intake of high sugar food and beverages with increase physical activity Lab Results  Component Value Date   HGBA1C 7.2 (A) 12/19/2023

## 2023-12-26 DIAGNOSIS — L97322 Non-pressure chronic ulcer of left ankle with fat layer exposed: Secondary | ICD-10-CM | POA: Diagnosis not present

## 2023-12-26 DIAGNOSIS — L97512 Non-pressure chronic ulcer of other part of right foot with fat layer exposed: Secondary | ICD-10-CM | POA: Diagnosis not present

## 2024-01-05 ENCOUNTER — Ambulatory Visit: Payer: Self-pay | Admitting: Family Medicine

## 2024-01-06 ENCOUNTER — Other Ambulatory Visit: Payer: Self-pay | Admitting: Family Medicine

## 2024-01-06 DIAGNOSIS — K219 Gastro-esophageal reflux disease without esophagitis: Secondary | ICD-10-CM

## 2024-01-16 DIAGNOSIS — L97512 Non-pressure chronic ulcer of other part of right foot with fat layer exposed: Secondary | ICD-10-CM | POA: Diagnosis not present

## 2024-01-16 DIAGNOSIS — L97322 Non-pressure chronic ulcer of left ankle with fat layer exposed: Secondary | ICD-10-CM | POA: Diagnosis not present

## 2024-01-23 DIAGNOSIS — L97512 Non-pressure chronic ulcer of other part of right foot with fat layer exposed: Secondary | ICD-10-CM | POA: Diagnosis not present

## 2024-02-01 ENCOUNTER — Other Ambulatory Visit: Payer: Self-pay | Admitting: "Endocrinology

## 2024-03-12 ENCOUNTER — Other Ambulatory Visit: Payer: Self-pay | Admitting: Family Medicine

## 2024-03-12 DIAGNOSIS — E1165 Type 2 diabetes mellitus with hyperglycemia: Secondary | ICD-10-CM

## 2024-04-20 ENCOUNTER — Ambulatory Visit: Admitting: Family Medicine

## 2024-04-21 ENCOUNTER — Ambulatory Visit: Admitting: "Endocrinology

## 2024-09-28 ENCOUNTER — Ambulatory Visit
